# Patient Record
Sex: Male | Born: 1935 | Race: White | Hispanic: No | Marital: Married | Smoking: Former smoker
Health system: Southern US, Community
[De-identification: ages and names within clinical notes are randomized; demographics above are authoritative.]

## PROBLEM LIST (undated history)

## (undated) DIAGNOSIS — C801 Malignant (primary) neoplasm, unspecified: Secondary | ICD-10-CM

## (undated) DIAGNOSIS — C4490 Unspecified malignant neoplasm of skin, unspecified: Secondary | ICD-10-CM

## (undated) DIAGNOSIS — E039 Hypothyroidism, unspecified: Secondary | ICD-10-CM

## (undated) HISTORY — DX: Hypothyroidism, unspecified: E03.9

## (undated) HISTORY — DX: Unspecified malignant neoplasm of skin, unspecified: C44.90

---

## 2001-09-06 ENCOUNTER — Encounter: Admission: RE | Admit: 2001-09-06 | Discharge: 2001-09-06 | Payer: Self-pay | Admitting: Family Medicine

## 2001-09-06 ENCOUNTER — Encounter: Payer: Self-pay | Admitting: Family Medicine

## 2001-09-28 ENCOUNTER — Encounter: Admission: RE | Admit: 2001-09-28 | Discharge: 2001-09-28 | Payer: Self-pay | Admitting: Family Medicine

## 2001-09-28 ENCOUNTER — Encounter: Payer: Self-pay | Admitting: Family Medicine

## 2011-09-28 HISTORY — PX: PROSTATECTOMY: SHX69

## 2012-05-22 DIAGNOSIS — R351 Nocturia: Secondary | ICD-10-CM | POA: Insufficient documentation

## 2012-05-22 DIAGNOSIS — N400 Enlarged prostate without lower urinary tract symptoms: Secondary | ICD-10-CM | POA: Insufficient documentation

## 2012-07-19 DIAGNOSIS — R7989 Other specified abnormal findings of blood chemistry: Secondary | ICD-10-CM | POA: Insufficient documentation

## 2012-10-13 DIAGNOSIS — N35014 Post-traumatic urethral stricture, male, unspecified: Secondary | ICD-10-CM | POA: Insufficient documentation

## 2014-11-08 DIAGNOSIS — H04123 Dry eye syndrome of bilateral lacrimal glands: Secondary | ICD-10-CM | POA: Diagnosis not present

## 2014-11-08 DIAGNOSIS — D3132 Benign neoplasm of left choroid: Secondary | ICD-10-CM | POA: Diagnosis not present

## 2014-11-08 DIAGNOSIS — H2513 Age-related nuclear cataract, bilateral: Secondary | ICD-10-CM | POA: Diagnosis not present

## 2015-03-17 DIAGNOSIS — E780 Pure hypercholesterolemia: Secondary | ICD-10-CM | POA: Diagnosis not present

## 2015-03-17 DIAGNOSIS — Z0000 Encounter for general adult medical examination without abnormal findings: Secondary | ICD-10-CM | POA: Diagnosis not present

## 2015-03-17 DIAGNOSIS — E039 Hypothyroidism, unspecified: Secondary | ICD-10-CM | POA: Diagnosis not present

## 2018-06-16 DIAGNOSIS — R310 Gross hematuria: Secondary | ICD-10-CM | POA: Insufficient documentation

## 2018-06-19 DIAGNOSIS — N3289 Other specified disorders of bladder: Secondary | ICD-10-CM | POA: Insufficient documentation

## 2018-07-18 HISTORY — PX: TRANSURETHRAL RESECTION OF BLADDER TUMOR WITH GYRUS (TURBT-GYRUS): SHX6458

## 2018-07-27 ENCOUNTER — Encounter: Payer: Self-pay | Admitting: Oncology

## 2018-07-27 ENCOUNTER — Telehealth: Payer: Self-pay | Admitting: Oncology

## 2018-07-27 NOTE — Telephone Encounter (Signed)
New referral received from Dr. Tresa Endo at [facility]. I received a call from [place] at Dr. Rosana Hoes' office to schedule an appt. Pt has been scheduled to see Dr. Alen Blew on 11/8 at 2pm. Letter mailed to the pt.

## 2018-08-04 ENCOUNTER — Inpatient Hospital Stay: Payer: Medicare Other

## 2018-08-04 ENCOUNTER — Inpatient Hospital Stay: Payer: Medicare Other | Attending: Oncology | Admitting: Oncology

## 2018-08-04 ENCOUNTER — Telehealth: Payer: Self-pay | Admitting: Oncology

## 2018-08-04 VITALS — BP 133/72 | HR 85 | Temp 98.0°F | Resp 18 | Ht 71.0 in | Wt 188.2 lb

## 2018-08-04 DIAGNOSIS — Z5111 Encounter for antineoplastic chemotherapy: Secondary | ICD-10-CM | POA: Diagnosis present

## 2018-08-04 DIAGNOSIS — Z7189 Other specified counseling: Secondary | ICD-10-CM | POA: Diagnosis not present

## 2018-08-04 DIAGNOSIS — C679 Malignant neoplasm of bladder, unspecified: Secondary | ICD-10-CM

## 2018-08-04 LAB — CBC WITH DIFFERENTIAL (CANCER CENTER ONLY)
Abs Immature Granulocytes: 0.04 10*3/uL (ref 0.00–0.07)
Basophils Absolute: 0.1 10*3/uL (ref 0.0–0.1)
Basophils Relative: 1 %
EOS ABS: 0.2 10*3/uL (ref 0.0–0.5)
EOS PCT: 2 %
HEMATOCRIT: 43.3 % (ref 39.0–52.0)
Hemoglobin: 14 g/dL (ref 13.0–17.0)
Immature Granulocytes: 0 %
LYMPHS ABS: 2.4 10*3/uL (ref 0.7–4.0)
Lymphocytes Relative: 26 %
MCH: 30.6 pg (ref 26.0–34.0)
MCHC: 32.3 g/dL (ref 30.0–36.0)
MCV: 94.5 fL (ref 80.0–100.0)
MONOS PCT: 7 %
Monocytes Absolute: 0.6 10*3/uL (ref 0.1–1.0)
Neutro Abs: 6 10*3/uL (ref 1.7–7.7)
Neutrophils Relative %: 64 %
Platelet Count: 258 10*3/uL (ref 150–400)
RBC: 4.58 MIL/uL (ref 4.22–5.81)
RDW: 12.4 % (ref 11.5–15.5)
WBC Count: 9.3 10*3/uL (ref 4.0–10.5)
nRBC: 0 % (ref 0.0–0.2)

## 2018-08-04 LAB — CMP (CANCER CENTER ONLY)
ALBUMIN: 4.1 g/dL (ref 3.5–5.0)
ALT: 10 U/L (ref 0–44)
AST: 12 U/L — ABNORMAL LOW (ref 15–41)
Alkaline Phosphatase: 61 U/L (ref 38–126)
Anion gap: 9 (ref 5–15)
BUN: 19 mg/dL (ref 8–23)
CALCIUM: 9.9 mg/dL (ref 8.9–10.3)
CO2: 30 mmol/L (ref 22–32)
CREATININE: 1.61 mg/dL — AB (ref 0.61–1.24)
Chloride: 106 mmol/L (ref 98–111)
GFR, EST AFRICAN AMERICAN: 45 mL/min — AB (ref >60)
GFR, Estimated: 39 mL/min — ABNORMAL LOW (ref >60)
GLUCOSE: 96 mg/dL (ref 70–99)
Potassium: 5.3 mmol/L — ABNORMAL HIGH (ref 3.5–5.1)
SODIUM: 145 mmol/L (ref 135–145)
Total Bilirubin: 0.4 mg/dL (ref 0.3–1.2)
Total Protein: 7.4 g/dL (ref 6.5–8.1)

## 2018-08-04 NOTE — Telephone Encounter (Signed)
Printed calendar and avs. °

## 2018-08-04 NOTE — Progress Notes (Signed)
START OFF PATHWAY REGIMEN - Bladder   [id number]:Carboplatin AUC=2:   Administer weekly:     Carboplatin   **Always confirm dose/schedule in your pharmacy ordering system**  Patient Characteristics: Post Cystectomy, T2, N0, M0 AJCC M Category: M0 AJCC N Category: N0 AJCC T Category: pT2b Current evidence of distant metastases<= No AJCC 8 Stage Grouping: II Intent of Therapy: Curative Intent, Discussed with Patient

## 2018-08-04 NOTE — Progress Notes (Signed)
Reason for the request:    Bladder cancer  HPI: I was asked by Dr. Rosana Hoes to evaluate [name] for new diagnosis of bladder cancer.  This is an 82 year old man currently of [place].  He has history of BPH status post prostatectomy in 2013.  He has been evaluated by Dr. Rosana Hoes for gross hematuria in September 2019.  At that time he had a cystoscopy which showed a large mass in the right bladder wall occupying a big portion of the right anterior bladder wall.  Based on these findings he underwent a TURBT on July 18, 2018.  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  He was found to have T2 lesion with grade 3 histology.  He tolerated the procedure well without any complications.  He is no longer reporting any hematuria or dysuria.  He remains active and continues to be in excellent health and shape.  He was referred to me by Dr. Rosana Hoes for further evaluation regarding his recent diagnosis.  He does not report any headaches, blurry vision, syncope or seizures. Does not report any fevers, chills or sweats.  Does not report any cough, wheezing or hemoptysis.  Does not report any chest pain, palpitation, orthopnea or leg edema.  Does not report any nausea, vomiting or abdominal pain.  Does not report any constipation or diarrhea.  Does not report any skeletal complaints.    Does not report frequency, urgency or hematuria.  Does not report any skin rashes or lesions. Does not report any heat or cold intolerance.  Does not report any lymphadenopathy or petechiae.  Does not report any anxiety or depression.  Remaining review of systems is negative.   Past medical history: He has history of hypothyroidism and BPH.   No known drug allergies.  No family history   Social History: He denies any alcohol or tobacco abuse.   Socioeconomic History  . Marital status: Married    Spouse name: Not on file  . Number of children:  Not on file  . Years of education: Not on file  . Highest education level: Not on file  Occupational History  . Not on file  Social Needs  . Financial resource strain: Not on file  . Food insecurity:    Worry: Not on file    Inability: Not on file  . Transportation needs:    Medical: Not on file    Non-medical: Not on file  Tobacco Use  . Smoking status: Not on file  Substance and Sexual Activity  . Alcohol use: Not on file  . Drug use: Not on file  . Sexual activity: Not on file  Lifestyle  . Physical activity:    Days per week: Not on file    Minutes per session: Not on file  . Stress: Not on file  Relationships  . Social connections:    Talks on phone: Not on file    Gets together: Not on file    Attends religious service: Not on file    Active member of club or organization: Not on file    Attends meetings of clubs or organizations: Not on file    Relationship status: Not on file  . Intimate partner violence:    Fear of current or ex partner: Not on file    Emotionally abused: Not on file    Physically abused: Not on file    Forced sexual activity: Not on file  Other  Topics Concern  . Not on file  Social History Narrative  . Not on file  :  Pertinent items are noted in HPI.  Exam: Blood pressure 133/72, pulse 85, temperature 98 F (36.7 C), temperature source Oral, resp. rate 18, height 5\' 11"  (1.803 m), weight 188 lb 3.2 oz (85.4 kg), SpO2 99 %.   ECOG 1  General appearance: alert and cooperative appeared without distress. Head: atraumatic without any abnormalities. Eyes: conjunctivae/corneas clear. PERRL.  Sclera anicteric. Throat: lips, mucosa, and tongue normal; without oral thrush or ulcers. Resp: clear to auscultation bilaterally without rhonchi, wheezes or dullness to percussion. Cardio: regular rate and rhythm, S1, S2 normal, no murmur, click, rub or gallop GI: soft, non-tender; bowel sounds normal; no masses,  no organomegaly Skin: Skin color,  texture, turgor normal. No rashes or lesions Lymph nodes: Cervical, supraclavicular, and axillary nodes normal. Neurologic: Grossly normal without any motor, sensory or deep tendon reflexes. Musculoskeletal: No joint deformity or effusion.    Assessment and Plan:   82 year old man with the following:  1.  Bladder cancer diagnosed in October 2019.  He was found to have high-grade urothelial carcinoma with apparent muscle invasion indicating T2 disease.  His pathology does report high-grade tumor.  There is no evidence of extravesical extension or disease beyond the bladder although no imaging studies has been completed at this time.  His disease status was discussed today in detail as well as the natural course of muscle invasive bladder tumor today.  The first step is to complete his staging work-up with imaging studies of the chest abdomen and pelvis to complete the staging.  If we are dealing with a T2 a N0 disease treatment options were discussed today in detail.  These options would include radical cystectomy after neoadjuvant chemotherapy which constitutes really the standard of care.  He is in reasonable health and shape and his baseline kidney function appears adequate to withstand platinum based chemotherapy.  After discussion today, he is not interested in having radical cystectomy and would prefer an alternative option.  The role for definitive therapy with radiation concomitantly with chemotherapy as a bladder sparing approach was discussed today.  The role for platinum based concomitant chemotherapy was discussed today.  Complication associated with weekly carboplatin at this time was discussed.  These complications include nausea, fatigue, myelosuppression and renal insufficiency.  This will be administered weekly concomitantly with daily radiation for 6 weeks.  Complications associated with radiation were discussed briefly today which include fatigue, diarrhea and dysuria.  After  discussion today and discussing the risks and benefits of all these approaches he is interested in receiving radiation therapy as a definitive modality.  I will make the appropriate referrals to radiation oncology after completing his staging work-up in the immediate future.  He will also attend chemotherapy education class in preparation for his weekly carboplatin therapy.  2.  IV access: Peripheral veins will be used at this time.  Port-A-Cath can be used in the future IV access becomes an issue.  3.  Antiemetics: Prescription for Compazine will be made available to him.  4.  Kidney function surveillance: His creatinine clearance is around 55 cc/min I will continue to monitor periodically.  5.  Goals of care and prognosis: His disease appears to be curable at this time.  Staging CT scan can change that however.  Aggressive therapy is warranted.  6.  Follow-up: We will be in the next few weeks to start therapy after completing his staging work-up.  60  minutes was spent with the patient face-to-face today.  More than 50% of time was dedicated to reviewing his disease status, treatment options and complications related to therapy.   Thank you for the referral.  A copy of this consult has been forwarded to the requesting physician.

## 2018-08-07 ENCOUNTER — Ambulatory Visit (HOSPITAL_COMMUNITY)
Admission: RE | Admit: 2018-08-07 | Discharge: 2018-08-07 | Disposition: A | Discharge status: Home / Self Care | Payer: Medicare Other | Source: Ambulatory Visit | Attending: Oncology | Admitting: Oncology

## 2018-08-07 ENCOUNTER — Other Ambulatory Visit: Payer: Self-pay | Admitting: Oncology

## 2018-08-07 ENCOUNTER — Inpatient Hospital Stay: Payer: Medicare Other

## 2018-08-07 ENCOUNTER — Encounter (HOSPITAL_COMMUNITY): Payer: Self-pay

## 2018-08-07 DIAGNOSIS — I700 Atherosclerosis of aorta: Secondary | ICD-10-CM | POA: Diagnosis not present

## 2018-08-07 DIAGNOSIS — K4020 Bilateral inguinal hernia, without obstruction or gangrene, not specified as recurrent: Secondary | ICD-10-CM | POA: Insufficient documentation

## 2018-08-07 DIAGNOSIS — C679 Malignant neoplasm of bladder, unspecified: Secondary | ICD-10-CM | POA: Insufficient documentation

## 2018-08-07 HISTORY — DX: Malignant (primary) neoplasm, unspecified: C80.1

## 2018-08-07 MED ORDER — SODIUM CHLORIDE (PF) 0.9 % IJ SOLN
INTRAMUSCULAR | Status: AC
  Filled 2018-08-07: qty 50

## 2018-08-07 MED ORDER — SODIUM CHLORIDE 0.9 % IV SOLN
INTRAVENOUS | Status: AC
  Filled 2018-08-07: qty 250

## 2018-08-07 MED ORDER — IOHEXOL 300 MG/ML  SOLN
80.0000 mL | Freq: Once | INTRAMUSCULAR | Status: AC | PRN
  Administered 2018-08-07: 80 mL via INTRAVENOUS

## 2018-08-07 MED ORDER — PROCHLORPERAZINE MALEATE 10 MG PO TABS: 10.0000 mg | ORAL_TABLET | Freq: Four times a day (QID) | ORAL | 0 refills | Status: DC | PRN

## 2018-08-08 ENCOUNTER — Other Ambulatory Visit: Payer: Medicare Other

## 2018-08-14 ENCOUNTER — Inpatient Hospital Stay: Payer: Medicare Other

## 2018-08-14 ENCOUNTER — Encounter: Payer: Self-pay | Admitting: Oncology

## 2018-08-14 VITALS — BP 137/84 | HR 93 | Temp 98.3°F | Resp 18

## 2018-08-14 DIAGNOSIS — C679 Malignant neoplasm of bladder, unspecified: Secondary | ICD-10-CM

## 2018-08-14 DIAGNOSIS — Z5111 Encounter for antineoplastic chemotherapy: Secondary | ICD-10-CM | POA: Diagnosis not present

## 2018-08-14 LAB — CBC WITH DIFFERENTIAL (CANCER CENTER ONLY)
Abs Immature Granulocytes: 0.06 10*3/uL (ref 0.00–0.07)
Basophils Absolute: 0.1 10*3/uL (ref 0.0–0.1)
Basophils Relative: 1 %
EOS ABS: 0.1 10*3/uL (ref 0.0–0.5)
EOS PCT: 1 %
HCT: 44 % (ref 39.0–52.0)
HEMOGLOBIN: 14.1 g/dL (ref 13.0–17.0)
Immature Granulocytes: 1 %
LYMPHS ABS: 1.5 10*3/uL (ref 0.7–4.0)
LYMPHS PCT: 15 %
MCH: 29.9 pg (ref 26.0–34.0)
MCHC: 32 g/dL (ref 30.0–36.0)
MCV: 93.2 fL (ref 80.0–100.0)
MONO ABS: 0.8 10*3/uL (ref 0.1–1.0)
MONOS PCT: 8 %
Neutro Abs: 7.5 10*3/uL (ref 1.7–7.7)
Neutrophils Relative %: 74 %
Platelet Count: 231 10*3/uL (ref 150–400)
RBC: 4.72 MIL/uL (ref 4.22–5.81)
RDW: 11.9 % (ref 11.5–15.5)
WBC Count: 10.2 10*3/uL (ref 4.0–10.5)
nRBC: 0 % (ref 0.0–0.2)

## 2018-08-14 LAB — CMP (CANCER CENTER ONLY)
ALBUMIN: 3.5 g/dL (ref 3.5–5.0)
ALK PHOS: 68 U/L (ref 38–126)
ALT: 8 U/L (ref 0–44)
AST: 11 U/L — AB (ref 15–41)
Anion gap: 10 (ref 5–15)
BUN: 15 mg/dL (ref 8–23)
CALCIUM: 9.4 mg/dL (ref 8.9–10.3)
CHLORIDE: 108 mmol/L (ref 98–111)
CO2: 24 mmol/L (ref 22–32)
CREATININE: 1.31 mg/dL — AB (ref 0.61–1.24)
GFR, EST AFRICAN AMERICAN: 58 mL/min — AB (ref >60)
GFR, Estimated: 50 mL/min — ABNORMAL LOW (ref >60)
GLUCOSE: 116 mg/dL — AB (ref 70–99)
Potassium: 4 mmol/L (ref 3.5–5.1)
SODIUM: 142 mmol/L (ref 135–145)
Total Bilirubin: 0.6 mg/dL (ref 0.3–1.2)
Total Protein: 7.2 g/dL (ref 6.5–8.1)

## 2018-08-14 MED ORDER — DEXAMETHASONE SODIUM PHOSPHATE 10 MG/ML IJ SOLN
INTRAMUSCULAR | Status: AC
  Filled 2018-08-14: qty 1

## 2018-08-14 MED ORDER — SODIUM CHLORIDE 0.9 % IV SOLN
160.0000 mg | Freq: Once | INTRAVENOUS | Status: AC
  Administered 2018-08-14: 160 mg via INTRAVENOUS
  Filled 2018-08-14: qty 16

## 2018-08-14 MED ORDER — SODIUM CHLORIDE 0.9 % IV SOLN
Freq: Once | INTRAVENOUS | Status: AC
  Administered 2018-08-14: 10:00:00 via INTRAVENOUS
  Filled 2018-08-14: qty 250

## 2018-08-14 MED ORDER — PALONOSETRON HCL INJECTION 0.25 MG/5ML
0.2500 mg | Freq: Once | INTRAVENOUS | Status: AC
  Administered 2018-08-14: 0.25 mg via INTRAVENOUS

## 2018-08-14 MED ORDER — DEXAMETHASONE SODIUM PHOSPHATE 10 MG/ML IJ SOLN
10.0000 mg | Freq: Once | INTRAMUSCULAR | Status: AC
  Administered 2018-08-14: 10 mg via INTRAVENOUS

## 2018-08-14 MED ORDER — PALONOSETRON HCL INJECTION 0.25 MG/5ML
INTRAVENOUS | Status: AC
  Filled 2018-08-14: qty 5

## 2018-08-14 NOTE — Patient Instructions (Signed)
[facility] Discharge Instructions for Patients Receiving Chemotherapy  Today you received the following chemotherapy agents Carboplatin To help prevent nausea and vomiting after your treatment, we encourage you to take your nausea medication as directed   If you develop nausea and vomiting that is not controlled by your nausea medication, call the clinic.   BELOW ARE SYMPTOMS THAT SHOULD BE REPORTED IMMEDIATELY:  *FEVER GREATER THAN 100.5 F  *CHILLS WITH OR WITHOUT FEVER  NAUSEA AND VOMITING THAT IS NOT CONTROLLED WITH YOUR NAUSEA MEDICATION  *UNUSUAL SHORTNESS OF BREATH  *UNUSUAL BRUISING OR BLEEDING  TENDERNESS IN MOUTH AND THROAT WITH OR WITHOUT PRESENCE OF ULCERS  *URINARY PROBLEMS  *BOWEL PROBLEMS  UNUSUAL RASH Items with * indicate a potential emergency and should be followed up as soon as possible.  Feel free to call the clinic should you have any questions or concerns. The clinic phone number is [phone].  Please show the CHEMO ALERT CARD at check-in to the Emergency Department and triage nurse.      Carboplatin injection What is this medicine? CARBOPLATIN (KAR boe pla tin) is a chemotherapy drug. It targets fast dividing cells, like cancer cells, and causes these cells to die. This medicine is used to treat ovarian cancer and many other cancers. This medicine may be used for other purposes; ask your health care provider or pharmacist if you have questions. COMMON BRAND NAME(S): Paraplatin What should I tell my health care provider before I take this medicine? They need to know if you have any of these conditions: -blood disorders -hearing problems -kidney disease -recent or ongoing radiation therapy -an unusual or allergic reaction to carboplatin, cisplatin, other chemotherapy, other medicines, foods, dyes, or preservatives -pregnant or trying to get pregnant -breast-feeding How should I use this medicine? This drug is usually given as  an infusion into a vein. It is administered in a hospital or clinic by a specially trained health care professional. Talk to your pediatrician regarding the use of this medicine in children. Special care may be needed. Overdosage: If you think you have taken too much of this medicine contact a poison control center or emergency room at once. NOTE: This medicine is only for you. Do not share this medicine with others. What if I miss a dose? It is important not to miss a dose. Call your doctor or health care professional if you are unable to keep an appointment. What may interact with this medicine? -medicines for seizures -medicines to increase blood counts like filgrastim, pegfilgrastim, sargramostim -some antibiotics like amikacin, gentamicin, neomycin, streptomycin, tobramycin -vaccines Talk to your doctor or health care professional before taking any of these medicines: -acetaminophen -aspirin -ibuprofen -ketoprofen -naproxen This list may not describe all possible interactions. Give your health care provider a list of all the medicines, herbs, non-prescription drugs, or dietary supplements you use. Also tell them if you smoke, drink alcohol, or use illegal drugs. Some items may interact with your medicine. What should I watch for while using this medicine? Your condition will be monitored carefully while you are receiving this medicine. You will need important blood work done while you are taking this medicine. This drug may make you feel generally unwell. This is not uncommon, as chemotherapy can affect healthy cells as well as cancer cells. Report any side effects. Continue your course of treatment even though you feel ill unless your doctor tells you to stop. In some cases, you may be given additional medicines to help with side   effects. Follow all directions for their use. Call your doctor or health care professional for advice if you get a fever, chills or sore throat, or other  symptoms of a cold or flu. Do not treat yourself. This drug decreases your body's ability to fight infections. Try to avoid being around people who are sick. This medicine may increase your risk to bruise or bleed. Call your doctor or health care professional if you notice any unusual bleeding. Be careful brushing and flossing your teeth or using a toothpick because you may get an infection or bleed more easily. If you have any dental work done, tell your dentist you are receiving this medicine. Avoid taking products that contain aspirin, acetaminophen, ibuprofen, naproxen, or ketoprofen unless instructed by your doctor. These medicines may hide a fever. Do not become pregnant while taking this medicine. Women should inform their doctor if they wish to become pregnant or think they might be pregnant. There is a potential for serious side effects to an unborn child. Talk to your health care professional or pharmacist for more information. Do not breast-feed an infant while taking this medicine. What side effects may I notice from receiving this medicine? Side effects that you should report to your doctor or health care professional as soon as possible: -allergic reactions like skin rash, itching or hives, swelling of the face, lips, or tongue -signs of infection - fever or chills, cough, sore throat, pain or difficulty passing urine -signs of decreased platelets or bleeding - bruising, pinpoint red spots on the skin, black, tarry stools, nosebleeds -signs of decreased red blood cells - unusually weak or tired, fainting spells, lightheadedness -breathing problems -changes in hearing -changes in vision -chest pain -high blood pressure -low blood counts - This drug may decrease the number of white blood cells, red blood cells and platelets. You may be at increased risk for infections and bleeding. -nausea and vomiting -pain, swelling, redness or irritation at the injection site -pain, tingling,  numbness in the hands or feet -problems with balance, talking, walking -trouble passing urine or change in the amount of urine Side effects that usually do not require medical attention (report to your doctor or health care professional if they continue or are bothersome): -hair loss -loss of appetite -metallic taste in the mouth or changes in taste This list may not describe all possible side effects. Call your doctor for medical advice about side effects. You may report side effects to FDA at 1-800-FDA-1088. Where should I keep my medicine? This drug is given in a hospital or clinic and will not be stored at home. NOTE: This sheet is a summary. It may not cover all possible information. If you have questions about this medicine, talk to your doctor, pharmacist, or health care provider.  2018 Elsevier/Gold Standard (2007-12-19 14:38:05)     

## 2018-08-14 NOTE — Progress Notes (Signed)
Pt returned my call so I introduced myself as his Arboriculturist and discussed copay assistance.  Pt gave me consent to apply in his behalf and he was approved w/ the Patient Access [employer] for $3,400 from8/20/19 to11/17/20 for Carboplatin.  Pt is overqualifiedfor the [employer]. I will give him my card on 08/16/18 for any questions or concerns he may have in the future.

## 2018-08-14 NOTE — Progress Notes (Signed)
Called pt to introduce myself as his Arboriculturist and to discuss copay assistance and the [employer].  Left a msg requesting he return my call at his earliest convenience.

## 2018-08-15 NOTE — Progress Notes (Signed)
GU Location of Tumor / Histology: urothelial carcinoma that is high-grade with invasion suspicious into the muscularis propria.  He was found to have T2 lesion with grade 3 histology  Anthony Pacheco presented with: gross hematuria in September 2019  Past/Anticipated interventions by urology, if any: 07/18/18 Dr. Rosana Hoes: Endoscopic Surgery  Cystoscopy, Transurethral Resection of Bladder Tumor   Past/Anticipated interventions by medical oncology, if any: Per Dr. Alen Blew 08/04/18:  1.  Bladder cancer diagnosed in October 2019.  He was found to have high-grade urothelial carcinoma with apparent muscle invasion indicating T2 disease.  His pathology does report high-grade tumor.  There is no evidence of extravesical extension or disease beyond the bladder although no imaging studies has been completed at this time.  His disease status was discussed today in detail as well as the natural course of muscle invasive bladder tumor today.  The first step is to complete his staging work-up with imaging studies of the chest abdomen and pelvis to complete the staging.  If we are dealing with a T2 a N0 disease treatment options were discussed today in detail.  These options would include radical cystectomy after neoadjuvant chemotherapy which constitutes really the standard of care.  He is in reasonable health and shape and his baseline kidney function appears adequate to withstand platinum based chemotherapy.  After discussion today, he is not interested in having radical cystectomy and would prefer an alternative option.  The role for definitive therapy with radiation concomitantly with chemotherapy as a bladder sparing approach was discussed today.  The role for platinum based concomitant chemotherapy was discussed today.  Complication associated with weekly carboplatin at this time was discussed.  These complications include nausea, fatigue, myelosuppression and renal insufficiency.  This will be administered  weekly concomitantly with daily radiation for 6 weeks.  Complications associated with radiation were discussed briefly today which include fatigue, diarrhea and dysuria.  After discussion today and discussing the risks and benefits of all these approaches he is interested in receiving radiation therapy as a definitive modality.  I will make the appropriate referrals to radiation oncology after completing his staging work-up in the immediate future.  He will also attend chemotherapy education class in preparation for his weekly carboplatin therapy.  2.  IV access: Peripheral veins will be used at this time.  Port-A-Cath can be used in the future IV access becomes an issue.  3.  Antiemetics: Prescription for Compazine will be made available to him.  4.  Kidney function surveillance: His creatinine clearance is around 55 cc/min I will continue to monitor periodically.  5.  Goals of care and prognosis: His disease appears to be curable at this time.  Staging CT scan can change that however.  Aggressive therapy is warranted.  6.  Follow-up: We will be in the next few weeks to start therapy after completing his staging work-up.  Weight changes, if any: None  Bowel/Bladder complaints, if any: Does not report any constipation or diarrhea. Does not report any skeletal complaints.   Does not report frequency, urgency or hematuria.   Nausea/Vomiting, if any: Does not report any nausea, vomiting or abdominal pain.  Pain issues, if any:  Pt denies c/o pain  SAFETY ISSUES:  Prior radiation? No  Pacemaker/ICD? No  Possible current pregnancy? N/A, pt is male  Is the patient on methotrexate? No  Current Complaints / other details:  Pt presents today for initial consult with Dr. Sondra Come for Radiation Therapy. Pt is accompanied by wife.  BP (!) 144/65 (BP Location: Right Arm, Patient Position: Sitting)   Pulse 89   Temp 97.9 F (36.6 C) (Oral)   Resp 18   Ht 5\' 10"  (1.778 m)   Wt 189 lb  6 oz (85.9 kg)   SpO2 99%   BMI 27.17 kg/m   Wt Readings from Last 3 Encounters:  08/16/18 189 lb 6 oz (85.9 kg)  08/04/18 188 lb 3.2 oz (85.4 kg)   [name], RN BSN

## 2018-08-16 ENCOUNTER — Telehealth: Payer: Self-pay | Admitting: *Deleted

## 2018-08-16 ENCOUNTER — Ambulatory Visit
Admission: RE | Admit: 2018-08-16 | Discharge: 2018-08-16 | Disposition: A | Discharge status: Home / Self Care | Payer: Medicare Other | Source: Ambulatory Visit | Attending: Radiation Oncology | Admitting: Radiation Oncology

## 2018-08-16 ENCOUNTER — Other Ambulatory Visit: Payer: Self-pay

## 2018-08-16 ENCOUNTER — Encounter: Payer: Self-pay | Admitting: Radiation Oncology

## 2018-08-16 VITALS — BP 144/65 | HR 89 | Temp 97.9°F | Resp 18 | Ht 70.0 in | Wt 189.4 lb

## 2018-08-16 DIAGNOSIS — C679 Malignant neoplasm of bladder, unspecified: Secondary | ICD-10-CM

## 2018-08-16 DIAGNOSIS — C673 Malignant neoplasm of anterior wall of bladder: Secondary | ICD-10-CM | POA: Diagnosis present

## 2018-08-16 NOTE — Telephone Encounter (Signed)
-----   Message from Veverly Fells, RN sent at 08/14/2018  1:00 PM EST ----- Regarding: SHADAD: First time follow up Pt received Carboplatin for the first time today. He tolerated his treatment well with no complaints.

## 2018-08-16 NOTE — Telephone Encounter (Signed)
Lm for patient to call me back re: 1st time chemo f/u call.

## 2018-08-16 NOTE — Progress Notes (Signed)
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 He was found to have T2 lesion with grade 3 histology  AJCC 8 Stage Grouping: II  HISTORY OF PRESENT ILLNESS::Anthony Pacheco is a 82 y.o. male who is presenting to the office today for evaluation of recently diagnosed muscle-invasive bladder cancer. He presented to his primary care physician Dr. [name] with hematuria who sent him to the urologist Dr. Rosana Hoes who diagnosed him with bladder cancer. He is accompanied by his wife.   On 07/18/18 he had a TURBT procedure after his above visits that showed multiple irregular tissue fragments. Intraoperatively the patient was noted to have a sessile tumor involving the anterior lateral wall of the urinary bladder.  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  He underwent a CT of his chest, abdomen and pelvis with contrast on 08/07/18 that showed a 1.7 x 3.6 cm soft tissue mass along the right anterior bladder wall - question neoplasm and/or surgical changes - with no evidence of synchronous uroepithelial malignancy, lymphadenopathy or metastatic disease.   he reports associated hematuria onset approximately 3 weeks ago. he denies headaches, vision problems, swelling and any other symptoms. No Problems with hematuria after his surgery   PREVIOUS RADIATION THERAPY: No  PAST MEDICAL HISTORY:  has a past medical history of bladder ca (dx'd 06/2018).    PAST SURGICAL HISTORY: Past Surgical History:    Procedure Laterality Date  . PROSTATECTOMY  2013  . TRANSURETHRAL RESECTION OF BLADDER TUMOR WITH GYRUS (TURBT-GYRUS)  07/18/2018    FAMILY HISTORY: family history is not on file.  SOCIAL HISTORY:    ALLERGIES: Patient has no known allergies.  MEDICATIONS:  Current Outpatient Medications  Medication Sig Dispense Refill  . levothyroxine (SYNTHROID, LEVOTHROID) 100 MCG tablet Take 100 mcg by mouth daily.  3  . prochlorperazine (COMPAZINE) 10 MG tablet Take 1 tablet (10 mg total) by mouth every 6 (six) hours as needed for nausea or vomiting. 30 tablet 0   No current facility-administered medications for this encounter.     REVIEW OF SYSTEMS:  A 10+ POINT REVIEW OF SYSTEMS WAS OBTAINED including neurology, dermatology, psychiatry, cardiac, respiratory, lymph, extremities, GI, GU, musculoskeletal, constitutional, reproductive, HEENT. All pertinent positives are noted in the HPI. All others are negative.    PHYSICAL EXAM:  height is 5\' 10"  (1.778 m) and weight is 189 lb 6 oz (85.9 kg). His oral temperature is 97.9 F (36.6 C). His blood pressure is 144/65 (abnormal) and his pulse is 89. His respiration is 18 and oxygen saturation is 99%.   General: Alert and oriented, in no acute distress HEENT: Head is normocephalic. Extraocular movements are intact. Oropharynx is clear. Neck: Neck is supple, no palpable cervical or supraclavicular lymphadenopathy. Heart: Regular in rate and rhythm with no murmurs, rubs, or gallops. Chest: Clear to auscultation bilaterally, with no rhonchi, wheezes, or rales. Abdomen: Soft, nontender, nondistended, with no rigidity or guarding. Extremities: No cyanosis or edema. Lymphatics: see Neck Exam Skin: No concerning lesions. He has small scars on his abdomen from laparoscopic prostate surgery.  Musculoskeletal: symmetric strength and muscle tone throughout. Neurologic: Cranial nerves II through XII are grossly intact. No obvious focalities. Speech is fluent.  Coordination is intact. Psychiatric: Judgment and insight are intact. Affect is appropriate.   ECOG = 1  0 - Asymptomatic (Fully active, able to carry on all predisease activities without restriction)  1 - Symptomatic but completely ambulatory (Restricted in physically strenuous activity but ambulatory and able to carry out work of a light or sedentary nature. For example, light housework, office work)  2 - Symptomatic, <50% in bed during the day (Ambulatory and capable of all self care but unable to carry out any work activities. Up and about more than 50% of waking hours)  3 - Symptomatic, >50% in bed, but not bedbound (Capable of only limited self-care, confined to bed or chair 50% or more of waking hours)  4 - Bedbound (Completely disabled. Cannot carry on any self-care. Totally confined to bed or chair)  5 - Death   Eustace Pen MM, Creech RH, Tormey DC, et al. [phone]). "Toxicity and response criteria of the [facility]". [place] Oncol. 5 (6): 649-55  LABORATORY DATA:  Lab Results  Component Value Date   WBC 10.2 08/14/2018   HGB 14.1 08/14/2018   HCT 44.0 08/14/2018   MCV 93.2 08/14/2018   PLT 231 08/14/2018   NEUTROABS 7.5 08/14/2018   Lab Results  Component Value Date   NA 142 08/14/2018   K 4.0 08/14/2018   CL 108 08/14/2018   CO2 24 08/14/2018   GLUCOSE 116 (H) 08/14/2018   CREATININE 1.31 (H) 08/14/2018   CALCIUM 9.4 08/14/2018      RADIOGRAPHY: Ct Chest W Contrast  Result Date: 08/07/2018 CLINICAL DATA:  82 year old male for staging of bladder cancer. Status post TURBT. EXAM: CT CHEST, ABDOMEN, AND PELVIS WITH CONTRAST TECHNIQUE: Multidetector CT imaging of the chest, abdomen and pelvis was performed following the standard protocol during bolus administration of intravenous contrast. CONTRAST:  62mL OMNIPAQUE IOHEXOL 300 MG/ML  SOLN COMPARISON:  None. FINDINGS: CT CHEST FINDINGS Cardiovascular: Normal heart size. Mild aortic atherosclerotic  calcifications noted without aneurysm. No pericardial effusion. Mediastinum/Nodes: No enlarged mediastinal, hilar, or axillary lymph nodes. Thyroid gland, trachea, and esophagus demonstrate no significant findings. Lungs/Pleura: Biapical pleuroparenchymal scarring and RIGHT pleural thickening/plaques noted. No airspace disease, consolidation, suspicious nodule, mass, pleural effusion or pneumothorax. Mild dependent opacities are noted which may represent atelectasis. Musculoskeletal: No chest wall mass or suspicious bone lesions identified. CT ABDOMEN PELVIS FINDINGS Hepatobiliary: The liver is unremarkable. Cholelithiasis identified without CT evidence of acute cholecystitis. No biliary dilatation. Pancreas: Unremarkable Spleen: Unremarkable Adrenals/Urinary Tract: Focal thickening along the RIGHT anterior bladder wall is noted measuring 1.7 x 3.6 cm. No filling defects are identified in the renal collecting systems or ureters. The kidneys and adrenal glands are unremarkable. Stomach/Bowel: Stomach is within normal limits. Appendix appears normal. No evidence of bowel wall thickening, distention, or inflammatory changes. Colonic diverticulosis noted without evidence of diverticulitis. Vascular/Lymphatic: Aortic atherosclerosis. No enlarged abdominal or pelvic lymph nodes. Reproductive: Prostate surgical changes noted. Other: No ascites, pneumoperitoneum or focal collection. Small bilateral inguinal hernias containing fat are present. Musculoskeletal: No acute or suspicious bony abnormalities. Degenerative changes within the lumbar spine identified. IMPRESSION: 1. 1.7 x 3.6 cm soft tissue along the RIGHT anterior bladder wall-question neoplasm and/or surgical changes. No evidence of synchronous uroepithelial malignancy, lymphadenopathy or metastatic disease. 2. Small bilateral inguinal hernias containing fat 3.  Aortic Atherosclerosis (ICD10-I70.0). Electronically Signed  By: Margarette Canada M.D.   On: 08/07/2018 09:39    Ct Abdomen Pelvis W Contrast  Result Date: 08/07/2018 CLINICAL DATA:  82 year old male for staging of bladder cancer. Status post TURBT. EXAM: CT CHEST, ABDOMEN, AND PELVIS WITH CONTRAST TECHNIQUE: Multidetector CT imaging of the chest, abdomen and pelvis was performed following the standard protocol during bolus administration of intravenous contrast. CONTRAST:  44mL OMNIPAQUE IOHEXOL 300 MG/ML  SOLN COMPARISON:  None. FINDINGS: CT CHEST FINDINGS Cardiovascular: Normal heart size. Mild aortic atherosclerotic calcifications noted without aneurysm. No pericardial effusion. Mediastinum/Nodes: No enlarged mediastinal, hilar, or axillary lymph nodes. Thyroid gland, trachea, and esophagus demonstrate no significant findings. Lungs/Pleura: Biapical pleuroparenchymal scarring and RIGHT pleural thickening/plaques noted. No airspace disease, consolidation, suspicious nodule, mass, pleural effusion or pneumothorax. Mild dependent opacities are noted which may represent atelectasis. Musculoskeletal: No chest wall mass or suspicious bone lesions identified. CT ABDOMEN PELVIS FINDINGS Hepatobiliary: The liver is unremarkable. Cholelithiasis identified without CT evidence of acute cholecystitis. No biliary dilatation. Pancreas: Unremarkable Spleen: Unremarkable Adrenals/Urinary Tract: Focal thickening along the RIGHT anterior bladder wall is noted measuring 1.7 x 3.6 cm. No filling defects are identified in the renal collecting systems or ureters. The kidneys and adrenal glands are unremarkable. Stomach/Bowel: Stomach is within normal limits. Appendix appears normal. No evidence of bowel wall thickening, distention, or inflammatory changes. Colonic diverticulosis noted without evidence of diverticulitis. Vascular/Lymphatic: Aortic atherosclerosis. No enlarged abdominal or pelvic lymph nodes. Reproductive: Prostate surgical changes noted. Other: No ascites, pneumoperitoneum or focal collection. Small bilateral inguinal  hernias containing fat are present. Musculoskeletal: No acute or suspicious bony abnormalities. Degenerative changes within the lumbar spine identified. IMPRESSION: 1. 1.7 x 3.6 cm soft tissue along the RIGHT anterior bladder wall-question neoplasm and/or surgical changes. No evidence of synchronous uroepithelial malignancy, lymphadenopathy or metastatic disease. 2. Small bilateral inguinal hernias containing fat 3.  Aortic Atherosclerosis (ICD10-I70.0). Electronically Signed   By: Margarette Canada M.D.   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 He was found to have T2 lesion with grade 3 histology. AJCC 8 Stage Grouping: II. Options for management were discussed with the patient by Dr. Lawerance Bach, Dr. Alen Blew and myself. Patient understands that his best chances for cure would be neoadjuvant chemotherapy followed by radical cystectomy. Patient does not wish to have his bladder permanently removed and would like to proceed with a definitive course of radiosensitizing chemotherapy along with radiation therapy directed at the pelvis/bladder area.  Today, I talked to the patient and his wife about the findings and work-up thus far.  We discussed the natural history of bladder cancer and general treatment, highlighting the role of radiotherapy in the management.  We discussed the available radiation techniques, and focused on the details of logistics and delivery.  We reviewed the anticipated acute and late sequelae associated with radiation in this setting.  The patient was encouraged to ask questions that I answered to the best of my ability.  A patient consent form was discussed and signed.  We retained a copy for our records.  The patient would like to proceed with radiation and will be scheduled for CT simulation.   PLAN: Patient is scheduled for CT simulation on November 25 with treatments to begin the first week in  December along with  weekly carboplatin based radiosensitizing chemotherapy. Anticipate 6 weeks of radiation therapy.    ------------------------------------------------  Blair Promise, PhD, MD  This document serves as a record of services personally performed by Gery Pray, MD. It was created on his behalf by Mary-Margaret Loma Messing, a trained medical scribe. The creation of this record is based on the scribe's personal observations and the provider's statements to them. This document has been checked and approved by the attending provider.

## 2018-08-21 ENCOUNTER — Ambulatory Visit
Admission: RE | Admit: 2018-08-21 | Discharge: 2018-08-21 | Disposition: A | Discharge status: Home / Self Care | Payer: Medicare Other | Source: Ambulatory Visit | Attending: Radiation Oncology | Admitting: Radiation Oncology

## 2018-08-21 ENCOUNTER — Inpatient Hospital Stay: Payer: Medicare Other

## 2018-08-21 ENCOUNTER — Inpatient Hospital Stay (HOSPITAL_BASED_OUTPATIENT_CLINIC_OR_DEPARTMENT_OTHER): Payer: Medicare Other | Admitting: Oncology

## 2018-08-21 VITALS — BP 142/77 | HR 88 | Temp 98.2°F | Resp 18 | Ht 70.0 in | Wt 187.6 lb

## 2018-08-21 DIAGNOSIS — Z5111 Encounter for antineoplastic chemotherapy: Secondary | ICD-10-CM | POA: Diagnosis not present

## 2018-08-21 DIAGNOSIS — Z7189 Other specified counseling: Secondary | ICD-10-CM | POA: Diagnosis not present

## 2018-08-21 DIAGNOSIS — C679 Malignant neoplasm of bladder, unspecified: Secondary | ICD-10-CM

## 2018-08-21 DIAGNOSIS — C673 Malignant neoplasm of anterior wall of bladder: Secondary | ICD-10-CM

## 2018-08-21 LAB — CMP (CANCER CENTER ONLY)
ALBUMIN: 3.6 g/dL (ref 3.5–5.0)
ALT: 11 U/L (ref 0–44)
AST: 12 U/L — AB (ref 15–41)
Alkaline Phosphatase: 62 U/L (ref 38–126)
Anion gap: 9 (ref 5–15)
BUN: 16 mg/dL (ref 8–23)
CHLORIDE: 108 mmol/L (ref 98–111)
CO2: 26 mmol/L (ref 22–32)
Calcium: 9.6 mg/dL (ref 8.9–10.3)
Creatinine: 1.3 mg/dL — ABNORMAL HIGH (ref 0.61–1.24)
GFR, Est AFR Am: 58 mL/min — ABNORMAL LOW (ref >60)
GFR, Estimated: 50 mL/min — ABNORMAL LOW (ref >60)
GLUCOSE: 105 mg/dL — AB (ref 70–99)
POTASSIUM: 4.6 mmol/L (ref 3.5–5.1)
Sodium: 143 mmol/L (ref 135–145)
Total Bilirubin: 0.3 mg/dL (ref 0.3–1.2)
Total Protein: 6.8 g/dL (ref 6.5–8.1)

## 2018-08-21 LAB — CBC WITH DIFFERENTIAL (CANCER CENTER ONLY)
ABS IMMATURE GRANULOCYTES: 0.2 10*3/uL — AB (ref 0.00–0.07)
Basophils Absolute: 0.1 10*3/uL (ref 0.0–0.1)
Basophils Relative: 1 %
Eosinophils Absolute: 0.2 10*3/uL (ref 0.0–0.5)
Eosinophils Relative: 2 %
HCT: 42 % (ref 39.0–52.0)
Hemoglobin: 13.5 g/dL (ref 13.0–17.0)
Immature Granulocytes: 2 %
LYMPHS PCT: 18 %
Lymphs Abs: 2.1 10*3/uL (ref 0.7–4.0)
MCH: 29.9 pg (ref 26.0–34.0)
MCHC: 32.1 g/dL (ref 30.0–36.0)
MCV: 92.9 fL (ref 80.0–100.0)
MONO ABS: 0.7 10*3/uL (ref 0.1–1.0)
MONOS PCT: 6 %
NEUTROS ABS: 8.3 10*3/uL — AB (ref 1.7–7.7)
Neutrophils Relative %: 71 %
Platelet Count: 313 10*3/uL (ref 150–400)
RBC: 4.52 MIL/uL (ref 4.22–5.81)
RDW: 11.9 % (ref 11.5–15.5)
WBC Count: 11.6 10*3/uL — ABNORMAL HIGH (ref 4.0–10.5)
nRBC: 0 % (ref 0.0–0.2)

## 2018-08-21 NOTE — Progress Notes (Signed)
  Radiation Oncology         [phone] ________________________________  Name: Anthony Pacheco MRN: [id number]  Date: 08/21/2018  DOB: 07-Feb-1936  SIMULATION AND TREATMENT PLANNING NOTE    ICD-10-CM   1. 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 He was found to have T2 lesion with grade 3 histology  NARRATIVE:  The patient was brought to the [facility].  Identity was confirmed.  All relevant records and images related to the planned course of therapy were reviewed.  The patient freely provided informed written consent to proceed with treatment after reviewing the details related to the planned course of therapy. The consent form was witnessed and verified by the simulation staff.  Then, the patient was set-up in a stable reproducible  supine position for radiation therapy.  CT images were obtained.  Surface markings were placed.  The CT images were loaded into the planning software.  Then the target and avoidance structures were contoured.  Treatment planning then occurred.  The radiation prescription was entered and confirmed.  Then, I designed and supervised the construction of a total of 2 medically necessary complex treatment devices.  I have requested : Intensity Modulated Radiotherapy (IMRT) is medically necessary for this case for the following reason:  Small bowel sparing, femoral head and pubic bone sparing.  I have ordered:dose calc.  PLAN:  The patient will receive 45 Gy in 25 fractions directed at the pelvis area followed by a boost to the bladder mass for a cumulative dose of 60.2 - 64.8 gray depending on dose constraints including the small bowel and pubic bone.   Special Treatment Procedure Note: The patient will be receiving radiosensitizing chemotherapy. Given the  potential of increased toxicities related to combined therapy and the necessity for close monitoring of the patient and blood work, this constitutes a special treatment procedure. -----------------------------------  Blair Promise, PhD, MD  This document serves as a record of services personally performed by Gery Pray, MD. It was created on his behalf by Mary-Margaret Loma Messing, a trained medical scribe. The creation of this record is based on the scribe's personal observations and the provider's statements to them. This document has been checked and approved by the attending provider.

## 2018-08-21 NOTE — Progress Notes (Signed)
Hematology and Oncology Follow Up Visit  KRYSTAL DELDUCA [id number] 1936/03/24 82 y.o. 08/21/2018 8:25 AM [name], [name], MDMitchell, [name], MD   Principle Diagnosis: 82 year old with a bladder cancer diagnosed in October 2019.  He was found to have a T2N0 without any evidence of metastatic disease.   Prior Therapy:  TURBT on July 18, 2018.  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  He was found to have T2 lesion with grade 3 histology.    Current therapy: Under evaluation to start definitive therapy with radiation and chemotherapy utilizing weekly carboplatin.  He received the first week of carboplatin on 08/14/2018.  Chemotherapy  Interim History: [name] presents today for a follow-up visit.  Since the last visit, he was evaluated by Dr. Sondra Come and scheduled to start radiation first week of December.  He tolerated the first week of carboplatin without any major complications.  He denies any nausea, fatigue or infusion related complications.  His energy remain excellent and has not reported any abdominal distention or hematuria.  He denies any changes in his appetite or weight loss.  He does not report any headaches, blurry vision, syncope or seizures. Does not report any fevers, chills or sweats.  Does not report any cough, wheezing or hemoptysis.  Does not report any chest pain, palpitation, orthopnea or leg edema.  Does not report any nausea, vomiting or abdominal pain.  Does not report any constipation or diarrhea.  Does not report any skeletal complaints.    Does not report frequency, urgency or hematuria.  Does not report any skin rashes or lesions.  Does not report any lymphadenopathy or petechiae.  Does not report any anxiety or depression.  Remaining review of systems is negative.    Medications: I have reviewed the patient's current medications.  Current Outpatient Medications   Medication Sig Dispense Refill  . levothyroxine (SYNTHROID, LEVOTHROID) 100 MCG tablet Take 100 mcg by mouth daily.  3  . prochlorperazine (COMPAZINE) 10 MG tablet Take 1 tablet (10 mg total) by mouth every 6 (six) hours as needed for nausea or vomiting. 30 tablet 0   No current facility-administered medications for this visit.      Allergies: No Known Allergies  Past Medical History, Surgical history, Social history, and Family History were reviewed and updated.    Physical Exam: Blood pressure (!) 142/77, pulse 88, temperature 98.2 F (36.8 C), temperature source Oral, resp. rate 18, height 5\' 10"  (1.778 m), weight 187 lb 9.6 oz (85.1 kg), SpO2 100 %.   ECOG: 1 General appearance: alert and cooperative appeared without distress. Head: Normocephalic, without obvious abnormality Oropharynx: No oral thrush or ulcers. Eyes: No scleral icterus.  Pupils are equal and round reactive to light. Lymph nodes: Cervical, supraclavicular, and axillary nodes normal. Heart:regular rate and rhythm, S1, S2 normal, no murmur, click, rub or gallop Lung:chest clear, no wheezing, rales, normal symmetric air entry Abdomin: soft, non-tender, without masses or organomegaly. Neurological: No motor, sensory deficits.  Intact deep tendon reflexes. Skin: No rashes or lesions.  No ecchymosis or petechiae. Musculoskeletal: No joint deformity or effusion. Psychiatric: Mood and affect are appropriate.    Lab Results: Lab Results  Component Value Date   WBC 10.2 08/14/2018   HGB 14.1 08/14/2018   HCT 44.0 08/14/2018   MCV 93.2 08/14/2018   PLT 231 08/14/2018     Chemistry      Component Value Date/Time   NA 142 08/14/2018 0908  K 4.0 08/14/2018 0908   CL 108 08/14/2018 0908   CO2 24 08/14/2018 0908   BUN 15 08/14/2018 0908   CREATININE 1.31 (H) 08/14/2018 0908      Component Value Date/Time   CALCIUM 9.4 08/14/2018 0908   ALKPHOS 68 08/14/2018 0908   AST 11 (L) 08/14/2018 0908   ALT 8  08/14/2018 0908   BILITOT 0.6 08/14/2018 0908       Radiological Studies: IMPRESSION: 1. 1.7 x 3.6 cm soft tissue along the RIGHT anterior bladder wall-question neoplasm and/or surgical changes. No evidence of synchronous uroepithelial malignancy, lymphadenopathy or metastatic disease. 2. Small bilateral inguinal hernias containing fat 3.  Aortic Atherosclerosis (ICD10-I70.0).    Impression and Plan:  82 year old man with:  1.    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  CT scan obtained on 08/07/2018 were personally reviewed and showed no evidence of metastatic disease.   The natural course of this disease and treatment options were reviewed today.  Primary surgical therapy after neoadjuvant chemotherapy versus definitive therapy with radiation concomitantly with radiosensitizing chemotherapy were discussed.  He is agreeable to continue with definitive radiation therapy and weekly carboplatin.  The plan is to resume weekly carboplatin to coincide with radiation therapy which is scheduled to start on August 28, 2018.     2.  IV access: Peripheral veins remain adequate at this time no need for Port-A-Cath.  3.  Antiemetics: No issues with nausea or vomiting at this time.  He has Compazine with instruction how to use it.  4.  Kidney function surveillance: Kidney function remains stable and adequate at this time.  No changes.  5.  Goals of care and prognosis: Therapy remains curative at this time.  Aggressive measures are recommended.  6.  Follow-up: Will be weekly for chemotherapy will have an MD follow-up in 2 weeks.  15  minutes was spent with the patient face-to-face today.  More than 50% of time was dedicated to discussing his disease status, imaging studies and answering questions regarding future plan of care.    Zola Button, MD 11/25/20198:25 AM

## 2018-08-28 ENCOUNTER — Ambulatory Visit: Payer: Medicare Other

## 2018-08-28 ENCOUNTER — Other Ambulatory Visit: Payer: Medicare Other

## 2018-08-31 DIAGNOSIS — C673 Malignant neoplasm of anterior wall of bladder: Secondary | ICD-10-CM | POA: Diagnosis not present

## 2018-09-04 ENCOUNTER — Inpatient Hospital Stay: Payer: Medicare Other

## 2018-09-04 ENCOUNTER — Ambulatory Visit
Admission: RE | Admit: 2018-09-04 | Discharge: 2018-09-04 | Disposition: A | Discharge status: Home / Self Care | Payer: Medicare Other | Source: Ambulatory Visit | Attending: Radiation Oncology | Admitting: Radiation Oncology

## 2018-09-04 ENCOUNTER — Telehealth: Payer: Self-pay

## 2018-09-04 ENCOUNTER — Inpatient Hospital Stay: Payer: Medicare Other | Attending: Oncology | Admitting: Oncology

## 2018-09-04 VITALS — BP 140/74 | HR 92 | Temp 97.9°F | Resp 18 | Ht 70.0 in | Wt 187.0 lb

## 2018-09-04 DIAGNOSIS — C679 Malignant neoplasm of bladder, unspecified: Secondary | ICD-10-CM | POA: Insufficient documentation

## 2018-09-04 DIAGNOSIS — Z5111 Encounter for antineoplastic chemotherapy: Secondary | ICD-10-CM | POA: Diagnosis not present

## 2018-09-04 DIAGNOSIS — C673 Malignant neoplasm of anterior wall of bladder: Secondary | ICD-10-CM

## 2018-09-04 DIAGNOSIS — Z7189 Other specified counseling: Secondary | ICD-10-CM

## 2018-09-04 LAB — CBC WITH DIFFERENTIAL (CANCER CENTER ONLY)
Abs Immature Granulocytes: 0.03 10*3/uL (ref 0.00–0.07)
Basophils Absolute: 0.1 10*3/uL (ref 0.0–0.1)
Basophils Relative: 1 %
Eosinophils Absolute: 0.2 10*3/uL (ref 0.0–0.5)
Eosinophils Relative: 3 %
HCT: 42.9 % (ref 39.0–52.0)
Hemoglobin: 13.6 g/dL (ref 13.0–17.0)
Immature Granulocytes: 0 %
Lymphocytes Relative: 25 %
Lymphs Abs: 1.8 10*3/uL (ref 0.7–4.0)
MCH: 29.6 pg (ref 26.0–34.0)
MCHC: 31.7 g/dL (ref 30.0–36.0)
MCV: 93.5 fL (ref 80.0–100.0)
Monocytes Absolute: 0.6 10*3/uL (ref 0.1–1.0)
Monocytes Relative: 8 %
NEUTROS PCT: 63 %
Neutro Abs: 4.5 10*3/uL (ref 1.7–7.7)
Platelet Count: 238 10*3/uL (ref 150–400)
RBC: 4.59 MIL/uL (ref 4.22–5.81)
RDW: 12.6 % (ref 11.5–15.5)
WBC Count: 7.1 10*3/uL (ref 4.0–10.5)
nRBC: 0 % (ref 0.0–0.2)

## 2018-09-04 LAB — CMP (CANCER CENTER ONLY)
ALT: 9 U/L (ref 0–44)
ANION GAP: 10 (ref 5–15)
AST: 13 U/L — ABNORMAL LOW (ref 15–41)
Albumin: 3.8 g/dL (ref 3.5–5.0)
Alkaline Phosphatase: 55 U/L (ref 38–126)
BUN: 17 mg/dL (ref 8–23)
CO2: 24 mmol/L (ref 22–32)
Calcium: 9.2 mg/dL (ref 8.9–10.3)
Chloride: 109 mmol/L (ref 98–111)
Creatinine: 1.34 mg/dL — ABNORMAL HIGH (ref 0.61–1.24)
GFR, EST NON AFRICAN AMERICAN: 50 mL/min — AB (ref >60)
GFR, Est AFR Am: 58 mL/min — ABNORMAL LOW (ref >60)
Glucose, Bld: 105 mg/dL — ABNORMAL HIGH (ref 70–99)
Potassium: 4.1 mmol/L (ref 3.5–5.1)
Sodium: 143 mmol/L (ref 135–145)
Total Bilirubin: 0.5 mg/dL (ref 0.3–1.2)
Total Protein: 7.1 g/dL (ref 6.5–8.1)

## 2018-09-04 MED ORDER — DEXAMETHASONE SODIUM PHOSPHATE 10 MG/ML IJ SOLN
INTRAMUSCULAR | Status: AC
  Filled 2018-09-04: qty 1

## 2018-09-04 MED ORDER — DEXAMETHASONE SODIUM PHOSPHATE 10 MG/ML IJ SOLN
10.0000 mg | Freq: Once | INTRAMUSCULAR | Status: AC
  Administered 2018-09-04: 10 mg via INTRAVENOUS

## 2018-09-04 MED ORDER — SODIUM CHLORIDE 0.9 % IV SOLN
160.0000 mg | Freq: Once | INTRAVENOUS | Status: AC
  Administered 2018-09-04: 160 mg via INTRAVENOUS
  Filled 2018-09-04: qty 16

## 2018-09-04 MED ORDER — PALONOSETRON HCL INJECTION 0.25 MG/5ML
INTRAVENOUS | Status: AC
  Filled 2018-09-04: qty 5

## 2018-09-04 MED ORDER — SODIUM CHLORIDE 0.9 % IV SOLN
Freq: Once | INTRAVENOUS | Status: AC
  Administered 2018-09-04: 10:00:00 via INTRAVENOUS
  Filled 2018-09-04: qty 250

## 2018-09-04 MED ORDER — PALONOSETRON HCL INJECTION 0.25 MG/5ML
0.2500 mg | Freq: Once | INTRAVENOUS | Status: AC
  Administered 2018-09-04: 0.25 mg via INTRAVENOUS

## 2018-09-04 NOTE — Progress Notes (Signed)
Hematology and Oncology Follow Up Visit  Anthony Pacheco [id number] Sep 13, 1936 82 y.o. 09/04/2018 8:14 AM Anthony Pacheco.Dean, MDMitchell, Pacheco.Anthony Sa, MD   Principle Diagnosis: 82 year old with T2N0 urothelial carcinoma of the bladder diagnosed in October 2019.   Prior Therapy:  TURBT on July 18, 2018.  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  He was found to have T2 lesion with grade 3 histology.    Current therapy: Radiation therapy to the bladder set to start on September 04, 2018.  He is also receiving weekly carboplatin AUC of 2.  Interim History: [name] is here for repeat evaluation.  Since the last visit, he reports no major changes.  He has no recent complaints including no hematuria or flank pain.  He is eating well and has not had any changes in his appetite.  His performance status quality of life remains excellent.  He denies any changes in his bowel habits.  He denies any recent hospitalizations or illnesses.  He does not report any headaches, blurry vision, syncope or seizures.  Denies any confusion or lethargy.  Does not report any fevers, chills or sweats.  Does not report any cough, wheezing or hemoptysis.  Does not report any chest pain, palpitation, orthopnea or leg edema.  Does not report any nausea, vomiting or early satiety. Does not report any changes in bowel habits.  Does not report any arthralgias or myalgias.    Does not report frequency, urgency or hematuria.  Does not report any skin rashes or lesions.  Does not report any bleeding or clotting tendency.  Does not report any mood changes.  Remaining review of systems is negative.    Medications: I have reviewed the patient's current medications.  Current Outpatient Medications  Medication Sig Dispense Refill  . levothyroxine (SYNTHROID, LEVOTHROID) 100 MCG tablet Take 100 mcg by mouth daily.  3  . prochlorperazine (COMPAZINE)  10 MG tablet Take 1 tablet (10 mg total) by mouth every 6 (six) hours as needed for nausea or vomiting. 30 tablet 0   No current facility-administered medications for this visit.      Allergies: No Known Allergies  Past Medical History, Surgical history, Social history, and Family History were reviewed and updated.    Physical Exam: Blood pressure 140/74, pulse 92, temperature 97.9 F (36.6 C), temperature source Oral, resp. rate 18, height 5\' 10"  (1.778 Pacheco), weight 187 lb (84.8 kg), SpO2 98 %.    ECOG: 1   General appearance: Alert, awake without any distress. Head: Atraumatic without abnormalities Oropharynx: Without any thrush or ulcers. Eyes: No scleral icterus. Lymph nodes: No lymphadenopathy noted in the cervical, supraclavicular, or axillary nodes Heart:regular rate and rhythm, without any murmurs or gallops.   Lung: Clear to auscultation without any rhonchi, wheezes or dullness to percussion. Abdomin: Soft, nontender without any shifting dullness or ascites. Musculoskeletal: No clubbing or cyanosis. Neurological: No motor or sensory deficits. Skin: No rashes or lesions.       Lab Results: Lab Results  Component Value Date   WBC 11.6 (H) 08/21/2018   HGB 13.5 08/21/2018   HCT 42.0 08/21/2018   MCV 92.9 08/21/2018   PLT 313 08/21/2018     Chemistry      Component Value Date/Time   NA 143 08/21/2018 0808   K 4.6 08/21/2018 0808   CL 108 08/21/2018 0808   CO2 26 08/21/2018 0808   BUN 16 08/21/2018 0808   CREATININE 1.30 (  H) 08/21/2018 0808      Component Value Date/Time   CALCIUM 9.6 08/21/2018 0808   ALKPHOS 62 08/21/2018 0808   AST 12 (Pacheco) 08/21/2018 0808   ALT 11 08/21/2018 0808   BILITOT 0.3 08/21/2018 0808         Impression and Plan:  82 year old man with:  1.    Bladder cancer diagnosed in October 2019.  He was found to have T2N0 high-grade urothelial without any evidence of metastatic disease.  He is set to start curative therapy  with radiation and weekly carboplatin.  Risks and benefits of this approach was reviewed again.  Long-term complication associated with carboplatin chemotherapy was discussed.  These complications include nausea, vomiting, myelosuppression, renal dysfunction as well as infusion related complications.  He is agreeable to proceed at this time.     2.  IV access: Peripheral veins will continue to be in use at this time.  3.  Antiemetics: Antiemetics in the form of Compazine are available to him at this time.  No issues reported.  4.  Kidney function surveillance: Kidney function remains stable at this time.  We will continue to monitor on carboplatin therapy.  5.  Goals of care and prognosis: His disease remains incurable aggressive therapy is warranted.  6.  Follow-up: He will receive weekly carboplatin and have MD follow-up on December 30.  15  minutes was spent with the patient face-to-face today.  More than 50% of time was dedicated to reviewing his disease status, treatment options, laboratory data and couple case related to therapy.    Zola Button, MD 12/9/20198:14 AM

## 2018-09-04 NOTE — Telephone Encounter (Signed)
Printed avs and calender of upcoming appointment. Per 1/29 los 

## 2018-09-04 NOTE — Progress Notes (Signed)
  Radiation Oncology         [phone] ________________________________  Name: Anthony Pacheco MRN: [id number]  Date: 09/04/2018  DOB: 02/03/1936  Simulation Verification Note    ICD-10-CM   1. Malignant neoplasm of anterior wall of urinary bladder (HCC) C67.3     Status: outpatient  NARRATIVE: The patient was brought to the treatment unit and placed in the planned treatment position. The clinical setup was verified. Then port films were obtained and uploaded to the radiation oncology medical record software.  The treatment beams were carefully compared against the planned radiation fields. The position location and shape of the radiation fields was reviewed. They targeted volume of tissue appears to be appropriately covered by the radiation beams. Organs at risk appear to be excluded as planned.  Based on my personal review, I approved the simulation verification. The patient's treatment will proceed as planned.  -----------------------------------  Blair Promise, PhD, MD

## 2018-09-04 NOTE — Patient Instructions (Addendum)
[facility] Discharge Instructions for Patients Receiving Chemotherapy  Today you received the following chemotherapy agents Carboplatin  To help prevent nausea and vomiting after your treatment, we encourage you to take your nausea medication as directed   If you develop nausea and vomiting that is not controlled by your nausea medication, call the clinic.   BELOW ARE SYMPTOMS THAT SHOULD BE REPORTED IMMEDIATELY:  *FEVER GREATER THAN 100.5 F  *CHILLS WITH OR WITHOUT FEVER  NAUSEA AND VOMITING THAT IS NOT CONTROLLED WITH YOUR NAUSEA MEDICATION  *UNUSUAL SHORTNESS OF BREATH  *UNUSUAL BRUISING OR BLEEDING  TENDERNESS IN MOUTH AND THROAT WITH OR WITHOUT PRESENCE OF ULCERS  *URINARY PROBLEMS  *BOWEL PROBLEMS  UNUSUAL RASH Items with * indicate a potential emergency and should be followed up as soon as possible.  Feel free to call the clinic should you have any questions or concerns. The clinic phone number is [phone].  Please show the CHEMO ALERT CARD at check-in to the Emergency Department and triage nurse.   

## 2018-09-05 ENCOUNTER — Ambulatory Visit
Admission: RE | Admit: 2018-09-05 | Discharge: 2018-09-05 | Disposition: A | Discharge status: Home / Self Care | Payer: Medicare Other | Source: Ambulatory Visit | Attending: Radiation Oncology | Admitting: Radiation Oncology

## 2018-09-05 DIAGNOSIS — C673 Malignant neoplasm of anterior wall of bladder: Secondary | ICD-10-CM | POA: Diagnosis not present

## 2018-09-05 NOTE — Progress Notes (Signed)
Pt here for patient teaching.  Pt given Radiation and You booklet and skin care instructions.  Reviewed areas of pertinence such as diarrhea, fatigue, hair loss, nausea and vomiting, sexual and fertility changes, skin changes and urinary and bladder changes . Pt able to give teach back of to pat skin, use unscented/gentle soap, use baby wipes, have Imodium on hand and drink plenty of water,avoid applying anything to skin within 4 hours of treatment. Pt verbalized understanding of information given and will contact nursing with any questions or concerns.     [email or web address]     [name], RN BSN

## 2018-09-06 ENCOUNTER — Ambulatory Visit
Admission: RE | Admit: 2018-09-06 | Discharge: 2018-09-06 | Disposition: A | Discharge status: Home / Self Care | Payer: Medicare Other | Source: Ambulatory Visit | Attending: Radiation Oncology | Admitting: Radiation Oncology

## 2018-09-06 DIAGNOSIS — C673 Malignant neoplasm of anterior wall of bladder: Secondary | ICD-10-CM | POA: Diagnosis not present

## 2018-09-07 ENCOUNTER — Ambulatory Visit
Admission: RE | Admit: 2018-09-07 | Discharge: 2018-09-07 | Disposition: A | Discharge status: Home / Self Care | Payer: Medicare Other | Source: Ambulatory Visit | Attending: Radiation Oncology | Admitting: Radiation Oncology

## 2018-09-07 DIAGNOSIS — C673 Malignant neoplasm of anterior wall of bladder: Secondary | ICD-10-CM | POA: Diagnosis not present

## 2018-09-08 ENCOUNTER — Ambulatory Visit
Admission: RE | Admit: 2018-09-08 | Discharge: 2018-09-08 | Disposition: A | Discharge status: Home / Self Care | Payer: Medicare Other | Source: Ambulatory Visit | Attending: Radiation Oncology | Admitting: Radiation Oncology

## 2018-09-08 DIAGNOSIS — C673 Malignant neoplasm of anterior wall of bladder: Secondary | ICD-10-CM | POA: Diagnosis not present

## 2018-09-11 ENCOUNTER — Ambulatory Visit
Admission: RE | Admit: 2018-09-11 | Discharge: 2018-09-11 | Disposition: A | Discharge status: Home / Self Care | Payer: Medicare Other | Source: Ambulatory Visit | Attending: Radiation Oncology | Admitting: Radiation Oncology

## 2018-09-11 ENCOUNTER — Inpatient Hospital Stay: Payer: Medicare Other

## 2018-09-11 VITALS — BP 133/75 | HR 79 | Temp 97.7°F | Resp 16

## 2018-09-11 DIAGNOSIS — C679 Malignant neoplasm of bladder, unspecified: Secondary | ICD-10-CM

## 2018-09-11 DIAGNOSIS — C673 Malignant neoplasm of anterior wall of bladder: Secondary | ICD-10-CM | POA: Diagnosis not present

## 2018-09-11 DIAGNOSIS — Z5111 Encounter for antineoplastic chemotherapy: Secondary | ICD-10-CM | POA: Diagnosis not present

## 2018-09-11 LAB — CBC WITH DIFFERENTIAL (CANCER CENTER ONLY)
Abs Immature Granulocytes: 0.07 10*3/uL (ref 0.00–0.07)
Basophils Absolute: 0.1 10*3/uL (ref 0.0–0.1)
Basophils Relative: 1 %
Eosinophils Absolute: 0.2 10*3/uL (ref 0.0–0.5)
Eosinophils Relative: 4 %
HCT: 42 % (ref 39.0–52.0)
Hemoglobin: 13.6 g/dL (ref 13.0–17.0)
Immature Granulocytes: 1 %
Lymphocytes Relative: 21 %
Lymphs Abs: 1.2 10*3/uL (ref 0.7–4.0)
MCH: 30 pg (ref 26.0–34.0)
MCHC: 32.4 g/dL (ref 30.0–36.0)
MCV: 92.7 fL (ref 80.0–100.0)
MONOS PCT: 8 %
Monocytes Absolute: 0.5 10*3/uL (ref 0.1–1.0)
Neutro Abs: 3.7 10*3/uL (ref 1.7–7.7)
Neutrophils Relative %: 65 %
Platelet Count: 185 10*3/uL (ref 150–400)
RBC: 4.53 MIL/uL (ref 4.22–5.81)
RDW: 12.7 % (ref 11.5–15.5)
WBC Count: 5.7 10*3/uL (ref 4.0–10.5)
nRBC: 0 % (ref 0.0–0.2)

## 2018-09-11 LAB — CMP (CANCER CENTER ONLY)
ALK PHOS: 54 U/L (ref 38–126)
ALT: 11 U/L (ref 0–44)
AST: 13 U/L — ABNORMAL LOW (ref 15–41)
Albumin: 3.8 g/dL (ref 3.5–5.0)
Anion gap: 9 (ref 5–15)
BUN: 15 mg/dL (ref 8–23)
CALCIUM: 9.3 mg/dL (ref 8.9–10.3)
CO2: 24 mmol/L (ref 22–32)
Chloride: 109 mmol/L (ref 98–111)
Creatinine: 1.28 mg/dL — ABNORMAL HIGH (ref 0.61–1.24)
GFR, Est AFR Am: >60 mL/min (ref >60)
GFR, Estimated: 53 mL/min — ABNORMAL LOW (ref >60)
GLUCOSE: 99 mg/dL (ref 70–99)
Potassium: 4.2 mmol/L (ref 3.5–5.1)
Sodium: 142 mmol/L (ref 135–145)
Total Bilirubin: 0.7 mg/dL (ref 0.3–1.2)
Total Protein: 6.8 g/dL (ref 6.5–8.1)

## 2018-09-11 MED ORDER — PALONOSETRON HCL INJECTION 0.25 MG/5ML
0.2500 mg | Freq: Once | INTRAVENOUS | Status: AC
  Administered 2018-09-11: 0.25 mg via INTRAVENOUS

## 2018-09-11 MED ORDER — PALONOSETRON HCL INJECTION 0.25 MG/5ML
INTRAVENOUS | Status: AC
  Filled 2018-09-11: qty 5

## 2018-09-11 MED ORDER — SODIUM CHLORIDE 0.9 % IV SOLN
Freq: Once | INTRAVENOUS | Status: AC
  Administered 2018-09-11: 10:00:00 via INTRAVENOUS
  Filled 2018-09-11: qty 250

## 2018-09-11 MED ORDER — SODIUM CHLORIDE 0.9 % IV SOLN
160.0000 mg | Freq: Once | INTRAVENOUS | Status: AC
  Administered 2018-09-11: 160 mg via INTRAVENOUS
  Filled 2018-09-11: qty 16

## 2018-09-11 MED ORDER — DEXAMETHASONE SODIUM PHOSPHATE 10 MG/ML IJ SOLN
10.0000 mg | Freq: Once | INTRAMUSCULAR | Status: AC
  Administered 2018-09-11: 10 mg via INTRAVENOUS

## 2018-09-11 MED ORDER — DEXAMETHASONE SODIUM PHOSPHATE 10 MG/ML IJ SOLN
INTRAMUSCULAR | Status: AC
  Filled 2018-09-11: qty 1

## 2018-09-11 NOTE — Patient Instructions (Signed)
[facility] Discharge Instructions for Patients Receiving Chemotherapy  Today you received the following chemotherapy agents: Carboplatin.  To help prevent nausea and vomiting after your treatment, we encourage you to take your nausea medication as prescribed.   If you develop nausea and vomiting that is not controlled by your nausea medication, call the clinic.   BELOW ARE SYMPTOMS THAT SHOULD BE REPORTED IMMEDIATELY:  *FEVER GREATER THAN 100.5 F  *CHILLS WITH OR WITHOUT FEVER  NAUSEA AND VOMITING THAT IS NOT CONTROLLED WITH YOUR NAUSEA MEDICATION  *UNUSUAL SHORTNESS OF BREATH  *UNUSUAL BRUISING OR BLEEDING  TENDERNESS IN MOUTH AND THROAT WITH OR WITHOUT PRESENCE OF ULCERS  *URINARY PROBLEMS  *BOWEL PROBLEMS  UNUSUAL RASH Items with * indicate a potential emergency and should be followed up as soon as possible.  Feel free to call the clinic should you have any questions or concerns. The clinic phone number is [phone].  Please show the CHEMO ALERT CARD at check-in to the Emergency Department and triage nurse.     

## 2018-09-12 ENCOUNTER — Ambulatory Visit
Admission: RE | Admit: 2018-09-12 | Discharge: 2018-09-12 | Disposition: A | Discharge status: Home / Self Care | Payer: Medicare Other | Source: Ambulatory Visit | Attending: Radiation Oncology | Admitting: Radiation Oncology

## 2018-09-12 ENCOUNTER — Other Ambulatory Visit: Payer: Self-pay | Admitting: Oncology

## 2018-09-12 DIAGNOSIS — C679 Malignant neoplasm of bladder, unspecified: Secondary | ICD-10-CM

## 2018-09-12 DIAGNOSIS — C673 Malignant neoplasm of anterior wall of bladder: Secondary | ICD-10-CM | POA: Diagnosis not present

## 2018-09-13 ENCOUNTER — Telehealth: Payer: Self-pay | Admitting: Radiation Oncology

## 2018-09-13 ENCOUNTER — Ambulatory Visit
Admission: RE | Admit: 2018-09-13 | Discharge: 2018-09-13 | Disposition: A | Discharge status: Home / Self Care | Payer: Medicare Other | Source: Ambulatory Visit | Attending: Radiation Oncology | Admitting: Radiation Oncology

## 2018-09-13 DIAGNOSIS — C673 Malignant neoplasm of anterior wall of bladder: Secondary | ICD-10-CM | POA: Diagnosis not present

## 2018-09-13 NOTE — Telephone Encounter (Signed)
I called Anthony Pacheco to confirm I am working on his appeal for the radiation treatment. I advised I would follow up once I receive the decision from his insurance carrier.  Patient verbalized understanding.

## 2018-09-14 ENCOUNTER — Ambulatory Visit
Admission: RE | Admit: 2018-09-14 | Discharge: 2018-09-14 | Disposition: A | Discharge status: Home / Self Care | Payer: Medicare Other | Source: Ambulatory Visit | Attending: Radiation Oncology | Admitting: Radiation Oncology

## 2018-09-14 DIAGNOSIS — C673 Malignant neoplasm of anterior wall of bladder: Secondary | ICD-10-CM | POA: Diagnosis not present

## 2018-09-15 ENCOUNTER — Ambulatory Visit
Admission: RE | Admit: 2018-09-15 | Discharge: 2018-09-15 | Disposition: A | Discharge status: Home / Self Care | Payer: Medicare Other | Source: Ambulatory Visit | Attending: Radiation Oncology | Admitting: Radiation Oncology

## 2018-09-15 DIAGNOSIS — C673 Malignant neoplasm of anterior wall of bladder: Secondary | ICD-10-CM | POA: Diagnosis not present

## 2018-09-15 NOTE — Telephone Encounter (Signed)
As promised, I called Anthony Pacheco to confirm the appeal for his RT was approved. Patient verbalized understanding.

## 2018-09-18 ENCOUNTER — Inpatient Hospital Stay: Payer: Medicare Other

## 2018-09-18 ENCOUNTER — Ambulatory Visit
Admission: RE | Admit: 2018-09-18 | Discharge: 2018-09-18 | Disposition: A | Discharge status: Home / Self Care | Payer: Medicare Other | Source: Ambulatory Visit | Attending: Radiation Oncology | Admitting: Radiation Oncology

## 2018-09-18 VITALS — BP 133/78 | HR 85 | Temp 97.8°F | Resp 18 | Ht 70.0 in | Wt 184.1 lb

## 2018-09-18 DIAGNOSIS — Z5111 Encounter for antineoplastic chemotherapy: Secondary | ICD-10-CM | POA: Diagnosis not present

## 2018-09-18 DIAGNOSIS — C679 Malignant neoplasm of bladder, unspecified: Secondary | ICD-10-CM

## 2018-09-18 DIAGNOSIS — C673 Malignant neoplasm of anterior wall of bladder: Secondary | ICD-10-CM | POA: Diagnosis not present

## 2018-09-18 LAB — CBC WITH DIFFERENTIAL (CANCER CENTER ONLY)
Abs Immature Granulocytes: 0.03 10*3/uL (ref 0.00–0.07)
Basophils Absolute: 0 10*3/uL (ref 0.0–0.1)
Basophils Relative: 1 %
Eosinophils Absolute: 0.2 10*3/uL (ref 0.0–0.5)
Eosinophils Relative: 4 %
HCT: 40.9 % (ref 39.0–52.0)
Hemoglobin: 13.3 g/dL (ref 13.0–17.0)
Immature Granulocytes: 1 %
Lymphocytes Relative: 15 %
Lymphs Abs: 0.9 10*3/uL (ref 0.7–4.0)
MCH: 30.2 pg (ref 26.0–34.0)
MCHC: 32.5 g/dL (ref 30.0–36.0)
MCV: 93 fL (ref 80.0–100.0)
Monocytes Absolute: 0.4 10*3/uL (ref 0.1–1.0)
Monocytes Relative: 7 %
NEUTROS ABS: 4.3 10*3/uL (ref 1.7–7.7)
Neutrophils Relative %: 72 %
Platelet Count: 150 10*3/uL (ref 150–400)
RBC: 4.4 MIL/uL (ref 4.22–5.81)
RDW: 13.1 % (ref 11.5–15.5)
WBC Count: 5.9 10*3/uL (ref 4.0–10.5)
nRBC: 0 % (ref 0.0–0.2)

## 2018-09-18 LAB — CMP (CANCER CENTER ONLY)
ALT: 20 U/L (ref 0–44)
AST: 14 U/L — ABNORMAL LOW (ref 15–41)
Albumin: 3.8 g/dL (ref 3.5–5.0)
Alkaline Phosphatase: 50 U/L (ref 38–126)
Anion gap: 8 (ref 5–15)
BILIRUBIN TOTAL: 0.7 mg/dL (ref 0.3–1.2)
BUN: 13 mg/dL (ref 8–23)
CO2: 24 mmol/L (ref 22–32)
Calcium: 9 mg/dL (ref 8.9–10.3)
Chloride: 109 mmol/L (ref 98–111)
Creatinine: 1.26 mg/dL — ABNORMAL HIGH (ref 0.61–1.24)
GFR, EST NON AFRICAN AMERICAN: 54 mL/min — AB (ref >60)
GFR, Est AFR Am: >60 mL/min (ref >60)
Glucose, Bld: 111 mg/dL — ABNORMAL HIGH (ref 70–99)
Potassium: 4.2 mmol/L (ref 3.5–5.1)
Sodium: 141 mmol/L (ref 135–145)
TOTAL PROTEIN: 6.7 g/dL (ref 6.5–8.1)

## 2018-09-18 MED ORDER — SODIUM CHLORIDE 0.9 % IV SOLN
Freq: Once | INTRAVENOUS | Status: AC
  Administered 2018-09-18: 11:00:00 via INTRAVENOUS
  Filled 2018-09-18: qty 250

## 2018-09-18 MED ORDER — SODIUM CHLORIDE 0.9 % IV SOLN
160.0000 mg | Freq: Once | INTRAVENOUS | Status: AC
  Administered 2018-09-18: 160 mg via INTRAVENOUS
  Filled 2018-09-18: qty 16

## 2018-09-18 MED ORDER — PALONOSETRON HCL INJECTION 0.25 MG/5ML
0.2500 mg | Freq: Once | INTRAVENOUS | Status: AC
  Administered 2018-09-18: 0.25 mg via INTRAVENOUS

## 2018-09-18 MED ORDER — DEXAMETHASONE SODIUM PHOSPHATE 10 MG/ML IJ SOLN
INTRAMUSCULAR | Status: AC
  Filled 2018-09-18: qty 1

## 2018-09-18 MED ORDER — PALONOSETRON HCL INJECTION 0.25 MG/5ML
INTRAVENOUS | Status: AC
  Filled 2018-09-18: qty 5

## 2018-09-18 MED ORDER — DEXAMETHASONE SODIUM PHOSPHATE 10 MG/ML IJ SOLN
10.0000 mg | Freq: Once | INTRAMUSCULAR | Status: AC
  Administered 2018-09-18: 10 mg via INTRAVENOUS

## 2018-09-18 NOTE — Patient Instructions (Signed)
[facility] Discharge Instructions for Patients Receiving Chemotherapy  Today you received the following chemotherapy agents: Carboplatin.  To help prevent nausea and vomiting after your treatment, we encourage you to take your nausea medication as prescribed.   If you develop nausea and vomiting that is not controlled by your nausea medication, call the clinic.   BELOW ARE SYMPTOMS THAT SHOULD BE REPORTED IMMEDIATELY:  *FEVER GREATER THAN 100.5 F  *CHILLS WITH OR WITHOUT FEVER  NAUSEA AND VOMITING THAT IS NOT CONTROLLED WITH YOUR NAUSEA MEDICATION  *UNUSUAL SHORTNESS OF BREATH  *UNUSUAL BRUISING OR BLEEDING  TENDERNESS IN MOUTH AND THROAT WITH OR WITHOUT PRESENCE OF ULCERS  *URINARY PROBLEMS  *BOWEL PROBLEMS  UNUSUAL RASH Items with * indicate a potential emergency and should be followed up as soon as possible.  Feel free to call the clinic should you have any questions or concerns. The clinic phone number is [phone].  Please show the CHEMO ALERT CARD at check-in to the Emergency Department and triage nurse.     

## 2018-09-19 ENCOUNTER — Ambulatory Visit
Admission: RE | Admit: 2018-09-19 | Discharge: 2018-09-19 | Disposition: A | Discharge status: Home / Self Care | Payer: Medicare Other | Source: Ambulatory Visit | Attending: Radiation Oncology | Admitting: Radiation Oncology

## 2018-09-19 DIAGNOSIS — C673 Malignant neoplasm of anterior wall of bladder: Secondary | ICD-10-CM | POA: Diagnosis not present

## 2018-09-21 ENCOUNTER — Ambulatory Visit
Admission: RE | Admit: 2018-09-21 | Discharge: 2018-09-21 | Disposition: A | Discharge status: Home / Self Care | Payer: Medicare Other | Source: Ambulatory Visit | Attending: Radiation Oncology | Admitting: Radiation Oncology

## 2018-09-21 DIAGNOSIS — C673 Malignant neoplasm of anterior wall of bladder: Secondary | ICD-10-CM | POA: Diagnosis not present

## 2018-09-22 ENCOUNTER — Ambulatory Visit
Admission: RE | Admit: 2018-09-22 | Discharge: 2018-09-22 | Disposition: A | Discharge status: Home / Self Care | Payer: Medicare Other | Source: Ambulatory Visit | Attending: Radiation Oncology | Admitting: Radiation Oncology

## 2018-09-22 DIAGNOSIS — C673 Malignant neoplasm of anterior wall of bladder: Secondary | ICD-10-CM | POA: Diagnosis not present

## 2018-09-25 ENCOUNTER — Inpatient Hospital Stay: Payer: Medicare Other

## 2018-09-25 ENCOUNTER — Inpatient Hospital Stay: Payer: Medicare Other | Admitting: Oncology

## 2018-09-25 ENCOUNTER — Ambulatory Visit
Admission: RE | Admit: 2018-09-25 | Discharge: 2018-09-25 | Disposition: A | Discharge status: Home / Self Care | Payer: Medicare Other | Source: Ambulatory Visit | Attending: Radiation Oncology | Admitting: Radiation Oncology

## 2018-09-25 VITALS — BP 127/66 | HR 93 | Temp 98.0°F | Resp 18 | Ht 70.0 in | Wt 186.1 lb

## 2018-09-25 DIAGNOSIS — C679 Malignant neoplasm of bladder, unspecified: Secondary | ICD-10-CM

## 2018-09-25 DIAGNOSIS — Z5111 Encounter for antineoplastic chemotherapy: Secondary | ICD-10-CM | POA: Diagnosis not present

## 2018-09-25 DIAGNOSIS — C673 Malignant neoplasm of anterior wall of bladder: Secondary | ICD-10-CM | POA: Diagnosis not present

## 2018-09-25 DIAGNOSIS — Z95828 Presence of other vascular implants and grafts: Secondary | ICD-10-CM

## 2018-09-25 LAB — CBC WITH DIFFERENTIAL (CANCER CENTER ONLY)
Abs Immature Granulocytes: 0.02 10*3/uL (ref 0.00–0.07)
Basophils Absolute: 0 10*3/uL (ref 0.0–0.1)
Basophils Relative: 1 %
EOS PCT: 5 %
Eosinophils Absolute: 0.2 10*3/uL (ref 0.0–0.5)
HCT: 40.4 % (ref 39.0–52.0)
HEMOGLOBIN: 13 g/dL (ref 13.0–17.0)
Immature Granulocytes: 0 %
Lymphocytes Relative: 15 %
Lymphs Abs: 0.7 10*3/uL (ref 0.7–4.0)
MCH: 30.2 pg (ref 26.0–34.0)
MCHC: 32.2 g/dL (ref 30.0–36.0)
MCV: 94 fL (ref 80.0–100.0)
Monocytes Absolute: 0.4 10*3/uL (ref 0.1–1.0)
Monocytes Relative: 8 %
Neutro Abs: 3.5 10*3/uL (ref 1.7–7.7)
Neutrophils Relative %: 71 %
Platelet Count: 145 10*3/uL — ABNORMAL LOW (ref 150–400)
RBC: 4.3 MIL/uL (ref 4.22–5.81)
RDW: 13.5 % (ref 11.5–15.5)
WBC Count: 4.9 10*3/uL (ref 4.0–10.5)
nRBC: 0 % (ref 0.0–0.2)

## 2018-09-25 LAB — CMP (CANCER CENTER ONLY)
ALT: 16 U/L (ref 0–44)
AST: 13 U/L — ABNORMAL LOW (ref 15–41)
Albumin: 3.7 g/dL (ref 3.5–5.0)
Alkaline Phosphatase: 48 U/L (ref 38–126)
Anion gap: 9 (ref 5–15)
BUN: 11 mg/dL (ref 8–23)
CHLORIDE: 110 mmol/L (ref 98–111)
CO2: 24 mmol/L (ref 22–32)
Calcium: 9 mg/dL (ref 8.9–10.3)
Creatinine: 1.2 mg/dL (ref 0.61–1.24)
GFR, Est AFR Am: >60 mL/min (ref >60)
GFR, Estimated: 57 mL/min — ABNORMAL LOW (ref >60)
Glucose, Bld: 118 mg/dL — ABNORMAL HIGH (ref 70–99)
Potassium: 4 mmol/L (ref 3.5–5.1)
Sodium: 143 mmol/L (ref 135–145)
Total Bilirubin: 0.4 mg/dL (ref 0.3–1.2)
Total Protein: 6.6 g/dL (ref 6.5–8.1)

## 2018-09-25 LAB — MAGNESIUM: Magnesium: 1.6 mg/dL — ABNORMAL LOW (ref 1.7–2.4)

## 2018-09-25 MED ORDER — DEXAMETHASONE SODIUM PHOSPHATE 10 MG/ML IJ SOLN
10.0000 mg | Freq: Once | INTRAMUSCULAR | Status: AC
  Administered 2018-09-25: 10 mg via INTRAVENOUS

## 2018-09-25 MED ORDER — PALONOSETRON HCL INJECTION 0.25 MG/5ML
0.2500 mg | Freq: Once | INTRAVENOUS | Status: AC
  Administered 2018-09-25: 0.25 mg via INTRAVENOUS

## 2018-09-25 MED ORDER — SODIUM CHLORIDE 0.9 % IV SOLN
Freq: Once | INTRAVENOUS | Status: AC
  Administered 2018-09-25: 10:00:00 via INTRAVENOUS
  Filled 2018-09-25: qty 250

## 2018-09-25 MED ORDER — DEXAMETHASONE SODIUM PHOSPHATE 10 MG/ML IJ SOLN
INTRAMUSCULAR | Status: AC
  Filled 2018-09-25: qty 1

## 2018-09-25 MED ORDER — PALONOSETRON HCL INJECTION 0.25 MG/5ML
INTRAVENOUS | Status: AC
  Filled 2018-09-25: qty 5

## 2018-09-25 MED ORDER — SODIUM CHLORIDE 0.9 % IV SOLN
160.0000 mg | Freq: Once | INTRAVENOUS | Status: AC
  Administered 2018-09-25: 160 mg via INTRAVENOUS
  Filled 2018-09-25: qty 16

## 2018-09-25 NOTE — Patient Instructions (Signed)
[facility] Discharge Instructions for Patients Receiving Chemotherapy  Today you received the following chemotherapy agents: Carboplatin.  To help prevent nausea and vomiting after your treatment, we encourage you to take your nausea medication as prescribed.   If you develop nausea and vomiting that is not controlled by your nausea medication, call the clinic.   BELOW ARE SYMPTOMS THAT SHOULD BE REPORTED IMMEDIATELY:  *FEVER GREATER THAN 100.5 F  *CHILLS WITH OR WITHOUT FEVER  NAUSEA AND VOMITING THAT IS NOT CONTROLLED WITH YOUR NAUSEA MEDICATION  *UNUSUAL SHORTNESS OF BREATH  *UNUSUAL BRUISING OR BLEEDING  TENDERNESS IN MOUTH AND THROAT WITH OR WITHOUT PRESENCE OF ULCERS  *URINARY PROBLEMS  *BOWEL PROBLEMS  UNUSUAL RASH Items with * indicate a potential emergency and should be followed up as soon as possible.  Feel free to call the clinic should you have any questions or concerns. The clinic phone number is [phone].  Please show the CHEMO ALERT CARD at check-in to the Emergency Department and triage nurse.     

## 2018-09-25 NOTE — Progress Notes (Signed)
Hematology and Oncology Follow Up Visit  Anthony Pacheco [id number] 03-10-1936 82 y.o. 09/25/2018 8:32 AM Anthony Dust, [name], MDMitchell, [name], MD   Principle Diagnosis: 82 year old with bladder cancer diagnosed in 2019.  He presented with T2N0 urothelial carcinoma with metastatic disease.  Prior Therapy:  TURBT on July 18, 2018.  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  He was found to have T2 lesion with grade 3 histology.    Current therapy: Radiation therapy with weekly carboplatin AUC of 2 started in December 2019.  Therapy tentatively will conclude on October 16, 2017.  Interim History: [name] presents today for a follow-up.  Since the last visit, he continues to tolerate weekly carboplatin therapy without any complaints.  He reports no nausea, vomiting or infusion related complications.  He denies any worsening neuropathy or worsening diarrhea.  His performance status and quality of life remain excellent.  He does not report any headaches, blurry vision, syncope or seizures.  Denies any alteration of mental status or lethargy.  Does not report any fevers, chills or sweats.  Does not report any cough, wheezing or hemoptysis.  Does not report any chest pain, palpitation, orthopnea or leg edema.  Does not report any nausea, vomiting or distention. Does not report any constipation or diarrhea. Does not report any bone pain or pathological fractures.   Does not report frequency, urgency or hematuria.  Does not report any skin rashes or lesions.  Does not report any ecchymosis or petechiae.  Does not report any anxiety or depression.  Remaining review of systems is negative.    Medications: I have reviewed the patient's current medications.  Current Outpatient Medications  Medication Sig Dispense Refill  . levothyroxine (SYNTHROID, LEVOTHROID) 100 MCG tablet Take 100 mcg by mouth daily.  3  .  prochlorperazine (COMPAZINE) 10 MG tablet Take 1 tablet (10 mg total) by mouth every 6 (six) hours as needed for nausea or vomiting. 30 tablet 0   No current facility-administered medications for this visit.      Allergies: No Known Allergies  Past Medical History, Surgical history, Social history, and Family History were reviewed and updated.    Physical Exam: Blood pressure 127/66, pulse 93, temperature 98 F (36.7 C), temperature source Oral, resp. rate 18, height 5\' 10"  (1.778 m), weight 186 lb 1.6 oz (84.4 kg), SpO2 99 %.    ECOG: 1    General appearance: Comfortable appearing without any discomfort Head: Normocephalic without any trauma Oropharynx: Mucous membranes are moist and pink without any thrush or ulcers. Eyes: Pupils are equal and round reactive to light. Lymph nodes: No cervical, supraclavicular, inguinal or axillary lymphadenopathy.   Heart:regular rate and rhythm.  S1 and S2 without leg edema. Lung: Clear without any rhonchi or wheezes.  No dullness to percussion. Abdomin: Soft, nontender, nondistended with good bowel sounds.  No hepatosplenomegaly. Musculoskeletal: No joint deformity or effusion.  Full range of motion noted. Neurological: No deficits noted on motor, sensory and deep tendon reflex exam. Skin: No petechial rash or dryness.  Appeared moist.         Lab Results: Lab Results  Component Value Date   WBC 4.9 09/25/2018   HGB 13.0 09/25/2018   HCT 40.4 09/25/2018   MCV 94.0 09/25/2018   PLT 145 (L) 09/25/2018     Chemistry      Component Value Date/Time   NA 141 09/18/2018 0819   K 4.2 09/18/2018 7782  CL 109 09/18/2018 0819   CO2 24 09/18/2018 0819   BUN 13 09/18/2018 0819   CREATININE 1.26 (H) 09/18/2018 0819      Component Value Date/Time   CALCIUM 9.0 09/18/2018 0819   ALKPHOS 50 09/18/2018 0819   AST 14 (L) 09/18/2018 0819   ALT 20 09/18/2018 0819   BILITOT 0.7 09/18/2018 0819         Impression and  Plan:  82 year old man with:  1.    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   He is currently receiving definitive therapy with radiation and weekly carboplatin with excellent tolerance at this time.  Risks and benefits of continuing this therapy was reviewed today and he is agreeable to continue.  Long-term complications including neutropenia, renal dysfunction as well as neuropathy were all reviewed.  He is agreeable to continue.  The plan is to conclude therapy on October 16, 2018 and obtain imaging studies subsequent to that.     2.  IV access: Port-A-Cath inserted based on his preference will be used periodically.  Complication associated with this procedure was reviewed today.  These include bleeding, thrombosis and infection.  3.  Antiemetics: No nausea or vomiting reported at this time.  Compazine is available to him.  4.  Kidney function surveillance: Creatinine remains stable with clearance close to baseline.  5.  Goals of care and prognosis: Therapy remains curative at this time and aggressive therapy is warranted given his excellent performance status.  6.  Follow-up: He will continue receive carboplatin on a weekly basis and MD follow-up on October 16, 2017.  25  minutes was spent with the patient face-to-face today.  More than 50% of time was dedicated to discussing disease status, laboratory data review and coordinating plan of care.    Zola Button, MD 12/30/20198:32 AM

## 2018-09-26 ENCOUNTER — Other Ambulatory Visit: Payer: Self-pay | Admitting: Radiology

## 2018-09-26 ENCOUNTER — Ambulatory Visit
Admission: RE | Admit: 2018-09-26 | Discharge: 2018-09-26 | Disposition: A | Discharge status: Home / Self Care | Payer: Medicare Other | Source: Ambulatory Visit | Attending: Radiation Oncology | Admitting: Radiation Oncology

## 2018-09-26 ENCOUNTER — Telehealth: Payer: Self-pay | Admitting: Oncology

## 2018-09-26 ENCOUNTER — Telehealth: Payer: Self-pay | Admitting: *Deleted

## 2018-09-26 DIAGNOSIS — C673 Malignant neoplasm of anterior wall of bladder: Secondary | ICD-10-CM | POA: Diagnosis not present

## 2018-09-26 NOTE — Telephone Encounter (Signed)
Noted  

## 2018-09-26 NOTE — Telephone Encounter (Signed)
Patient calling. States his insurance is refusing payment for port-a-cath insertion. Patient has asked me to cancel his port insertion.

## 2018-09-26 NOTE — Telephone Encounter (Signed)
No los 12/30.

## 2018-09-28 ENCOUNTER — Ambulatory Visit (HOSPITAL_COMMUNITY): Payer: Medicare Other

## 2018-09-28 ENCOUNTER — Inpatient Hospital Stay (HOSPITAL_COMMUNITY): Admission: RE | Admit: 2018-09-28 | Payer: Medicare Other | Source: Ambulatory Visit

## 2018-09-28 ENCOUNTER — Ambulatory Visit
Admission: RE | Admit: 2018-09-28 | Discharge: 2018-09-28 | Disposition: A | Discharge status: Home / Self Care | Payer: Medicare Other | Source: Ambulatory Visit | Attending: Radiation Oncology | Admitting: Radiation Oncology

## 2018-09-28 DIAGNOSIS — C673 Malignant neoplasm of anterior wall of bladder: Secondary | ICD-10-CM | POA: Insufficient documentation

## 2018-09-28 DIAGNOSIS — Z510 Encounter for antineoplastic radiation therapy: Secondary | ICD-10-CM | POA: Insufficient documentation

## 2018-09-29 ENCOUNTER — Ambulatory Visit
Admission: RE | Admit: 2018-09-29 | Discharge: 2018-09-29 | Disposition: A | Discharge status: Home / Self Care | Payer: Medicare Other | Source: Ambulatory Visit | Attending: Radiation Oncology | Admitting: Radiation Oncology

## 2018-09-29 DIAGNOSIS — Z510 Encounter for antineoplastic radiation therapy: Secondary | ICD-10-CM | POA: Diagnosis not present

## 2018-10-02 ENCOUNTER — Ambulatory Visit
Admission: RE | Admit: 2018-10-02 | Discharge: 2018-10-02 | Disposition: A | Discharge status: Home / Self Care | Payer: Medicare Other | Source: Ambulatory Visit | Attending: Radiation Oncology | Admitting: Radiation Oncology

## 2018-10-02 ENCOUNTER — Inpatient Hospital Stay: Payer: Medicare Other | Attending: Oncology

## 2018-10-02 ENCOUNTER — Inpatient Hospital Stay: Payer: Medicare Other

## 2018-10-02 VITALS — BP 159/66 | HR 80 | Temp 97.8°F | Resp 18

## 2018-10-02 DIAGNOSIS — C679 Malignant neoplasm of bladder, unspecified: Secondary | ICD-10-CM

## 2018-10-02 DIAGNOSIS — Z510 Encounter for antineoplastic radiation therapy: Secondary | ICD-10-CM | POA: Diagnosis not present

## 2018-10-02 DIAGNOSIS — Z5111 Encounter for antineoplastic chemotherapy: Secondary | ICD-10-CM | POA: Insufficient documentation

## 2018-10-02 LAB — CMP (CANCER CENTER ONLY)
ALT: 16 U/L (ref 0–44)
AST: 13 U/L — ABNORMAL LOW (ref 15–41)
Albumin: 3.6 g/dL (ref 3.5–5.0)
Alkaline Phosphatase: 51 U/L (ref 38–126)
Anion gap: 9 (ref 5–15)
BUN: 12 mg/dL (ref 8–23)
CO2: 23 mmol/L (ref 22–32)
Calcium: 9.1 mg/dL (ref 8.9–10.3)
Chloride: 109 mmol/L (ref 98–111)
Creatinine: 1.2 mg/dL (ref 0.61–1.24)
GFR, Est AFR Am: >60 mL/min (ref >60)
GFR, Estimated: 57 mL/min — ABNORMAL LOW (ref >60)
Glucose, Bld: 103 mg/dL — ABNORMAL HIGH (ref 70–99)
Potassium: 4.1 mmol/L (ref 3.5–5.1)
Sodium: 141 mmol/L (ref 135–145)
Total Bilirubin: 0.6 mg/dL (ref 0.3–1.2)
Total Protein: 6.5 g/dL (ref 6.5–8.1)

## 2018-10-02 LAB — CBC WITH DIFFERENTIAL (CANCER CENTER ONLY)
Abs Immature Granulocytes: 0.04 10*3/uL (ref 0.00–0.07)
Basophils Absolute: 0 10*3/uL (ref 0.0–0.1)
Basophils Relative: 1 %
Eosinophils Absolute: 0.1 10*3/uL (ref 0.0–0.5)
Eosinophils Relative: 3 %
HCT: 40.1 % (ref 39.0–52.0)
Hemoglobin: 13.1 g/dL (ref 13.0–17.0)
Immature Granulocytes: 1 %
Lymphocytes Relative: 15 %
Lymphs Abs: 0.7 10*3/uL (ref 0.7–4.0)
MCH: 30.8 pg (ref 26.0–34.0)
MCHC: 32.7 g/dL (ref 30.0–36.0)
MCV: 94.4 fL (ref 80.0–100.0)
MONO ABS: 0.4 10*3/uL (ref 0.1–1.0)
MONOS PCT: 9 %
Neutro Abs: 3.4 10*3/uL (ref 1.7–7.7)
Neutrophils Relative %: 71 %
Platelet Count: 124 10*3/uL — ABNORMAL LOW (ref 150–400)
RBC: 4.25 MIL/uL (ref 4.22–5.81)
RDW: 14.2 % (ref 11.5–15.5)
WBC Count: 4.7 10*3/uL (ref 4.0–10.5)
nRBC: 0 % (ref 0.0–0.2)

## 2018-10-02 MED ORDER — SODIUM CHLORIDE 0.9 % IV SOLN
Freq: Once | INTRAVENOUS | Status: AC
  Administered 2018-10-02: 09:00:00 via INTRAVENOUS
  Filled 2018-10-02: qty 250

## 2018-10-02 MED ORDER — PALONOSETRON HCL INJECTION 0.25 MG/5ML
INTRAVENOUS | Status: AC
  Filled 2018-10-02: qty 5

## 2018-10-02 MED ORDER — SODIUM CHLORIDE 0.9 % IV SOLN
160.0000 mg | Freq: Once | INTRAVENOUS | Status: AC
  Administered 2018-10-02: 160 mg via INTRAVENOUS
  Filled 2018-10-02: qty 16

## 2018-10-02 MED ORDER — DEXAMETHASONE SODIUM PHOSPHATE 10 MG/ML IJ SOLN
INTRAMUSCULAR | Status: AC
  Filled 2018-10-02: qty 1

## 2018-10-02 MED ORDER — PALONOSETRON HCL INJECTION 0.25 MG/5ML
0.2500 mg | Freq: Once | INTRAVENOUS | Status: AC
  Administered 2018-10-02: 0.25 mg via INTRAVENOUS

## 2018-10-02 MED ORDER — DEXAMETHASONE SODIUM PHOSPHATE 10 MG/ML IJ SOLN
10.0000 mg | Freq: Once | INTRAMUSCULAR | Status: AC
  Administered 2018-10-02: 10 mg via INTRAVENOUS

## 2018-10-02 NOTE — Patient Instructions (Signed)
[facility] Discharge Instructions for Patients Receiving Chemotherapy  Today you received the following chemotherapy agents Carboplatin  To help prevent nausea and vomiting after your treatment, we encourage you to take your nausea medication as directed   If you develop nausea and vomiting that is not controlled by your nausea medication, call the clinic.   BELOW ARE SYMPTOMS THAT SHOULD BE REPORTED IMMEDIATELY:  *FEVER GREATER THAN 100.5 F  *CHILLS WITH OR WITHOUT FEVER  NAUSEA AND VOMITING THAT IS NOT CONTROLLED WITH YOUR NAUSEA MEDICATION  *UNUSUAL SHORTNESS OF BREATH  *UNUSUAL BRUISING OR BLEEDING  TENDERNESS IN MOUTH AND THROAT WITH OR WITHOUT PRESENCE OF ULCERS  *URINARY PROBLEMS  *BOWEL PROBLEMS  UNUSUAL RASH Items with * indicate a potential emergency and should be followed up as soon as possible.  Feel free to call the clinic should you have any questions or concerns. The clinic phone number is [phone].  Please show the CHEMO ALERT CARD at check-in to the Emergency Department and triage nurse.   

## 2018-10-03 ENCOUNTER — Telehealth: Payer: Self-pay

## 2018-10-03 ENCOUNTER — Ambulatory Visit
Admission: RE | Admit: 2018-10-03 | Discharge: 2018-10-03 | Disposition: A | Discharge status: Home / Self Care | Payer: Medicare Other | Source: Ambulatory Visit | Attending: Radiation Oncology | Admitting: Radiation Oncology

## 2018-10-03 DIAGNOSIS — Z510 Encounter for antineoplastic radiation therapy: Secondary | ICD-10-CM | POA: Diagnosis not present

## 2018-10-03 NOTE — Telephone Encounter (Signed)
Patient called to stated that he has had 2 episodes of numbness of the left hand and forearm lasting 15 minutes each. He will continue to monitor and will make [place] aware of any other symptoms or concerns. Dr. Alen Blew also aware.

## 2018-10-04 ENCOUNTER — Ambulatory Visit
Admission: RE | Admit: 2018-10-04 | Discharge: 2018-10-04 | Disposition: A | Discharge status: Home / Self Care | Payer: Medicare Other | Source: Ambulatory Visit | Attending: Radiation Oncology | Admitting: Radiation Oncology

## 2018-10-04 DIAGNOSIS — Z510 Encounter for antineoplastic radiation therapy: Secondary | ICD-10-CM | POA: Diagnosis not present

## 2018-10-05 ENCOUNTER — Ambulatory Visit
Admission: RE | Admit: 2018-10-05 | Discharge: 2018-10-05 | Disposition: A | Discharge status: Home / Self Care | Payer: Medicare Other | Source: Ambulatory Visit | Attending: Radiation Oncology | Admitting: Radiation Oncology

## 2018-10-05 DIAGNOSIS — Z510 Encounter for antineoplastic radiation therapy: Secondary | ICD-10-CM | POA: Diagnosis not present

## 2018-10-06 ENCOUNTER — Ambulatory Visit
Admission: RE | Admit: 2018-10-06 | Discharge: 2018-10-06 | Disposition: A | Discharge status: Home / Self Care | Payer: Medicare Other | Source: Ambulatory Visit | Attending: Radiation Oncology | Admitting: Radiation Oncology

## 2018-10-06 DIAGNOSIS — Z510 Encounter for antineoplastic radiation therapy: Secondary | ICD-10-CM | POA: Diagnosis not present

## 2018-10-09 ENCOUNTER — Inpatient Hospital Stay: Payer: Medicare Other

## 2018-10-09 ENCOUNTER — Ambulatory Visit
Admission: RE | Admit: 2018-10-09 | Discharge: 2018-10-09 | Disposition: A | Discharge status: Home / Self Care | Payer: Medicare Other | Source: Ambulatory Visit | Attending: Radiation Oncology | Admitting: Radiation Oncology

## 2018-10-09 VITALS — BP 120/78 | HR 87 | Temp 98.2°F | Resp 18 | Ht 71.0 in | Wt 183.5 lb

## 2018-10-09 DIAGNOSIS — C679 Malignant neoplasm of bladder, unspecified: Secondary | ICD-10-CM

## 2018-10-09 DIAGNOSIS — Z510 Encounter for antineoplastic radiation therapy: Secondary | ICD-10-CM | POA: Diagnosis not present

## 2018-10-09 DIAGNOSIS — Z5111 Encounter for antineoplastic chemotherapy: Secondary | ICD-10-CM | POA: Diagnosis not present

## 2018-10-09 LAB — CMP (CANCER CENTER ONLY)
ALT: 16 U/L (ref 0–44)
AST: 12 U/L — ABNORMAL LOW (ref 15–41)
Albumin: 3.7 g/dL (ref 3.5–5.0)
Alkaline Phosphatase: 51 U/L (ref 38–126)
Anion gap: 9 (ref 5–15)
BUN: 13 mg/dL (ref 8–23)
CO2: 25 mmol/L (ref 22–32)
Calcium: 9.3 mg/dL (ref 8.9–10.3)
Chloride: 108 mmol/L (ref 98–111)
Creatinine: 1.16 mg/dL (ref 0.61–1.24)
GFR, Estimated: 59 mL/min — ABNORMAL LOW (ref >60)
Glucose, Bld: 118 mg/dL — ABNORMAL HIGH (ref 70–99)
Potassium: 3.9 mmol/L (ref 3.5–5.1)
Sodium: 142 mmol/L (ref 135–145)
Total Bilirubin: 0.7 mg/dL (ref 0.3–1.2)
Total Protein: 6.8 g/dL (ref 6.5–8.1)

## 2018-10-09 LAB — CBC WITH DIFFERENTIAL (CANCER CENTER ONLY)
Abs Immature Granulocytes: 0.04 10*3/uL (ref 0.00–0.07)
BASOS ABS: 0 10*3/uL (ref 0.0–0.1)
Basophils Relative: 1 %
Eosinophils Absolute: 0.1 10*3/uL (ref 0.0–0.5)
Eosinophils Relative: 2 %
HCT: 39.4 % (ref 39.0–52.0)
Hemoglobin: 12.9 g/dL — ABNORMAL LOW (ref 13.0–17.0)
Immature Granulocytes: 1 %
Lymphocytes Relative: 12 %
Lymphs Abs: 0.5 10*3/uL — ABNORMAL LOW (ref 0.7–4.0)
MCH: 30.8 pg (ref 26.0–34.0)
MCHC: 32.7 g/dL (ref 30.0–36.0)
MCV: 94 fL (ref 80.0–100.0)
Monocytes Absolute: 0.3 10*3/uL (ref 0.1–1.0)
Monocytes Relative: 8 %
NEUTROS ABS: 3.3 10*3/uL (ref 1.7–7.7)
Neutrophils Relative %: 76 %
PLATELETS: 126 10*3/uL — AB (ref 150–400)
RBC: 4.19 MIL/uL — ABNORMAL LOW (ref 4.22–5.81)
RDW: 14.3 % (ref 11.5–15.5)
WBC Count: 4.3 10*3/uL (ref 4.0–10.5)
nRBC: 0 % (ref 0.0–0.2)

## 2018-10-09 MED ORDER — SODIUM CHLORIDE 0.9 % IV SOLN
160.0000 mg | Freq: Once | INTRAVENOUS | Status: AC
  Administered 2018-10-09: 160 mg via INTRAVENOUS
  Filled 2018-10-09: qty 16

## 2018-10-09 MED ORDER — SODIUM CHLORIDE 0.9 % IV SOLN
Freq: Once | INTRAVENOUS | Status: AC
  Administered 2018-10-09: 10:00:00 via INTRAVENOUS
  Filled 2018-10-09: qty 250

## 2018-10-09 MED ORDER — PALONOSETRON HCL INJECTION 0.25 MG/5ML
0.2500 mg | Freq: Once | INTRAVENOUS | Status: AC
  Administered 2018-10-09: 0.25 mg via INTRAVENOUS

## 2018-10-09 MED ORDER — PALONOSETRON HCL INJECTION 0.25 MG/5ML
INTRAVENOUS | Status: AC
  Filled 2018-10-09: qty 5

## 2018-10-09 MED ORDER — DEXAMETHASONE SODIUM PHOSPHATE 10 MG/ML IJ SOLN
INTRAMUSCULAR | Status: AC
  Filled 2018-10-09: qty 1

## 2018-10-09 MED ORDER — DEXAMETHASONE SODIUM PHOSPHATE 10 MG/ML IJ SOLN
10.0000 mg | Freq: Once | INTRAMUSCULAR | Status: AC
  Administered 2018-10-09: 10 mg via INTRAVENOUS

## 2018-10-09 NOTE — Patient Instructions (Signed)
[facility] Discharge Inst[facility] for Patients Receiving Chemotherapy  Today you received the following chemotherapy agents Carboplatin  To help prevent nausea and vomiting after your treatment, we encourage you to take your nausea medication as directed   If you develop nausea and vomiting that is not controlled by your nausea medication, call the clinic.   BELOW ARE SYMPTOMS THAT SHOULD BE REPORTED IMMEDIATELY:  *FEVER GREATER THAN 100.5 F  *CHILLS WITH OR WITHOUT FEVER  NAUSEA AND VOMITING THAT IS NOT CONTROLLED WITH YOUR NAUSEA MEDICATION  *UNUSUAL SHORTNESS OF BREATH  *UNUSUAL BRUISING OR BLEEDING  TENDERNESS IN MOUTH AND THROAT WITH OR WITHOUT PRESENCE OF ULCERS  *URINARY PROBLEMS  *BOWEL PROBLEMS  UNUSUAL RASH Items with * indicate a potential emergency and should be followed up as soon as possible.  Feel free to call the clinic should you have any questions or concerns. The clinic phone number is [phone].  Please show the CHEMO ALERT CARD at check-in to the Emergency Department and triage nurse.   

## 2018-10-10 ENCOUNTER — Ambulatory Visit
Admission: RE | Admit: 2018-10-10 | Discharge: 2018-10-10 | Disposition: A | Discharge status: Home / Self Care | Payer: Medicare Other | Source: Ambulatory Visit | Attending: Radiation Oncology | Admitting: Radiation Oncology

## 2018-10-10 DIAGNOSIS — Z510 Encounter for antineoplastic radiation therapy: Secondary | ICD-10-CM | POA: Diagnosis not present

## 2018-10-11 ENCOUNTER — Ambulatory Visit
Admission: RE | Admit: 2018-10-11 | Discharge: 2018-10-11 | Disposition: A | Discharge status: Home / Self Care | Payer: Medicare Other | Source: Ambulatory Visit | Attending: Radiation Oncology | Admitting: Radiation Oncology

## 2018-10-11 DIAGNOSIS — Z510 Encounter for antineoplastic radiation therapy: Secondary | ICD-10-CM | POA: Diagnosis not present

## 2018-10-12 ENCOUNTER — Ambulatory Visit
Admission: RE | Admit: 2018-10-12 | Discharge: 2018-10-12 | Disposition: A | Discharge status: Home / Self Care | Payer: Medicare Other | Source: Ambulatory Visit | Attending: Radiation Oncology | Admitting: Radiation Oncology

## 2018-10-12 DIAGNOSIS — Z510 Encounter for antineoplastic radiation therapy: Secondary | ICD-10-CM | POA: Diagnosis not present

## 2018-10-13 ENCOUNTER — Ambulatory Visit
Admission: RE | Admit: 2018-10-13 | Discharge: 2018-10-13 | Disposition: A | Discharge status: Home / Self Care | Payer: Medicare Other | Source: Ambulatory Visit | Attending: Radiation Oncology | Admitting: Radiation Oncology

## 2018-10-13 DIAGNOSIS — Z510 Encounter for antineoplastic radiation therapy: Secondary | ICD-10-CM | POA: Diagnosis not present

## 2018-10-16 ENCOUNTER — Inpatient Hospital Stay (HOSPITAL_BASED_OUTPATIENT_CLINIC_OR_DEPARTMENT_OTHER): Payer: Medicare Other | Admitting: Oncology

## 2018-10-16 ENCOUNTER — Inpatient Hospital Stay: Payer: Medicare Other

## 2018-10-16 ENCOUNTER — Ambulatory Visit
Admission: RE | Admit: 2018-10-16 | Discharge: 2018-10-16 | Disposition: A | Discharge status: Home / Self Care | Payer: Medicare Other | Source: Ambulatory Visit | Attending: Radiation Oncology | Admitting: Radiation Oncology

## 2018-10-16 ENCOUNTER — Telehealth: Payer: Self-pay | Admitting: Oncology

## 2018-10-16 VITALS — BP 146/75 | HR 84 | Temp 97.9°F | Resp 18 | Ht 71.0 in | Wt 184.2 lb

## 2018-10-16 DIAGNOSIS — Z5111 Encounter for antineoplastic chemotherapy: Secondary | ICD-10-CM | POA: Diagnosis not present

## 2018-10-16 DIAGNOSIS — C679 Malignant neoplasm of bladder, unspecified: Secondary | ICD-10-CM | POA: Diagnosis not present

## 2018-10-16 DIAGNOSIS — Z510 Encounter for antineoplastic radiation therapy: Secondary | ICD-10-CM | POA: Diagnosis not present

## 2018-10-16 DIAGNOSIS — Z7189 Other specified counseling: Secondary | ICD-10-CM

## 2018-10-16 LAB — CBC WITH DIFFERENTIAL (CANCER CENTER ONLY)
Abs Immature Granulocytes: 0.03 10*3/uL (ref 0.00–0.07)
BASOS PCT: 1 %
Basophils Absolute: 0 10*3/uL (ref 0.0–0.1)
Eosinophils Absolute: 0.1 10*3/uL (ref 0.0–0.5)
Eosinophils Relative: 2 %
HCT: 37.9 % — ABNORMAL LOW (ref 39.0–52.0)
Hemoglobin: 12.5 g/dL — ABNORMAL LOW (ref 13.0–17.0)
Immature Granulocytes: 1 %
Lymphocytes Relative: 14 %
Lymphs Abs: 0.5 10*3/uL — ABNORMAL LOW (ref 0.7–4.0)
MCH: 31.4 pg (ref 26.0–34.0)
MCHC: 33 g/dL (ref 30.0–36.0)
MCV: 95.2 fL (ref 80.0–100.0)
MONOS PCT: 12 %
Monocytes Absolute: 0.4 10*3/uL (ref 0.1–1.0)
Neutro Abs: 2.5 10*3/uL (ref 1.7–7.7)
Neutrophils Relative %: 70 %
Platelet Count: 126 10*3/uL — ABNORMAL LOW (ref 150–400)
RBC: 3.98 MIL/uL — ABNORMAL LOW (ref 4.22–5.81)
RDW: 15 % (ref 11.5–15.5)
WBC Count: 3.5 10*3/uL — ABNORMAL LOW (ref 4.0–10.5)
nRBC: 0 % (ref 0.0–0.2)

## 2018-10-16 LAB — CMP (CANCER CENTER ONLY)
ALT: 14 U/L (ref 0–44)
AST: 11 U/L — ABNORMAL LOW (ref 15–41)
Albumin: 3.7 g/dL (ref 3.5–5.0)
Alkaline Phosphatase: 52 U/L (ref 38–126)
Anion gap: 8 (ref 5–15)
BILIRUBIN TOTAL: 0.6 mg/dL (ref 0.3–1.2)
BUN: 11 mg/dL (ref 8–23)
CALCIUM: 9.1 mg/dL (ref 8.9–10.3)
CO2: 25 mmol/L (ref 22–32)
Chloride: 110 mmol/L (ref 98–111)
Creatinine: 1.14 mg/dL (ref 0.61–1.24)
GFR, Est AFR Am: >60 mL/min (ref >60)
GFR, Estimated: >60 mL/min (ref >60)
Glucose, Bld: 91 mg/dL (ref 70–99)
Potassium: 4.4 mmol/L (ref 3.5–5.1)
Sodium: 143 mmol/L (ref 135–145)
TOTAL PROTEIN: 6.8 g/dL (ref 6.5–8.1)

## 2018-10-16 MED ORDER — SODIUM CHLORIDE 0.9 % IV SOLN
160.0000 mg | Freq: Once | INTRAVENOUS | Status: AC
  Administered 2018-10-16: 160 mg via INTRAVENOUS
  Filled 2018-10-16: qty 16

## 2018-10-16 MED ORDER — PALONOSETRON HCL INJECTION 0.25 MG/5ML
0.2500 mg | Freq: Once | INTRAVENOUS | Status: AC
  Administered 2018-10-16: 0.25 mg via INTRAVENOUS

## 2018-10-16 MED ORDER — SODIUM CHLORIDE 0.9 % IV SOLN
Freq: Once | INTRAVENOUS | Status: AC
  Administered 2018-10-16: 11:00:00 via INTRAVENOUS
  Filled 2018-10-16: qty 250

## 2018-10-16 MED ORDER — DEXAMETHASONE SODIUM PHOSPHATE 10 MG/ML IJ SOLN
10.0000 mg | Freq: Once | INTRAMUSCULAR | Status: AC
  Administered 2018-10-16: 10 mg via INTRAVENOUS

## 2018-10-16 MED ORDER — PALONOSETRON HCL INJECTION 0.25 MG/5ML
INTRAVENOUS | Status: AC
  Filled 2018-10-16: qty 5

## 2018-10-16 MED ORDER — DEXAMETHASONE SODIUM PHOSPHATE 10 MG/ML IJ SOLN
INTRAMUSCULAR | Status: AC
  Filled 2018-10-16: qty 1

## 2018-10-16 NOTE — Telephone Encounter (Signed)
Printed calendar and avs. °

## 2018-10-16 NOTE — Progress Notes (Signed)
Hematology and Oncology Follow Up Visit  Anthony Pacheco [id number] [birth year] 83 y.o. 10/16/2018 10:52 AM [name], [name], MDMitchell, [name], MD   Principle Diagnosis: 83 year old with T2N0 urothelial carcinoma of the bladder diagnosed in 2019.    Prior Therapy:  TURBT on July 18, 2018.  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  Anthony Pacheco was found to have T2 lesion with grade 3 histology.    Current therapy: Radiation therapy with weekly carboplatin AUC of 2 started in December 2019. Anthony Pacheco is here for cycle 6 of carboplatin.  Interim History: Anthony Pacheco returns today for a repeat evaluation.  Since the last visit, Anthony Pacheco continues to tolerate therapy without any issues.  Anthony Pacheco denies any nausea, fatigue or infusion related complications.  Anthony Pacheco denies any worsening neuropathy.  His performance status and activity level remain excellent.  His IV access has been an issue at times but able to continue therapy.  Appetite and performance status remains unchanged.  Anthony Pacheco does not report any headaches, blurry vision, syncope or seizures.  Denies any changes in his mentation or lethargy..  Does not report any fevers, chills or sweats.  Does not report any cough, wheezing or hemoptysis.  Does not report any chest pain, palpitation, orthopnea or leg edema.  Does not report any nausea, vomiting or early satiety. Does not report any changes in bowel habits. Does not report any paralysis or myalgias.   Does not report frequency, urgency or hematuria.  Does not report any skin rashes or lesions.  Does not report any easy bruising.  Does not report any changes in his mood.  Remaining review of systems is negative.    Medications: I have reviewed the patient's current medications.  Current Outpatient Medications  Medication Sig Dispense Refill  . levothyroxine (SYNTHROID, LEVOTHROID) 100 MCG tablet Take 100 mcg by mouth daily.  3  .  prochlorperazine (COMPAZINE) 10 MG tablet Take 1 tablet (10 mg total) by mouth every 6 (six) hours as needed for nausea or vomiting. 30 tablet 0   No current facility-administered medications for this visit.      Allergies: No Known Allergies  Past Medical History, Surgical history, Social history, and Family History were reviewed and updated.    Physical Exam: Blood pressure (!) 146/75, pulse 84, temperature 97.9 F (36.6 C), temperature source Oral, resp. rate 18, height 5\' 11"  (1.803 m), weight 184 lb 3.2 oz (83.6 kg), SpO2 100 %.    ECOG: 1    General appearance: Alert, awake without any distress. Head: Atraumatic without abnormalities Oropharynx: Without any thrush or ulcers. Eyes: No scleral icterus. Lymph nodes: No lymphadenopathy noted in the cervical, supraclavicular, or axillary nodes Heart:regular rate and rhythm, without any murmurs or gallops.   Lung: Clear to auscultation without any rhonchi, wheezes or dullness to percussion. Abdomin: Soft, nontender without any shifting dullness or ascites. Musculoskeletal: No clubbing or cyanosis. Neurological: No motor or sensory deficits. Skin: No rashes or lesions.          Lab Results: Lab Results  Component Value Date   WBC 3.5 (L) 10/16/2018   HGB 12.5 (L) 10/16/2018   HCT 37.9 (L) 10/16/2018   MCV 95.2 10/16/2018   PLT 126 (L) 10/16/2018     Chemistry      Component Value Date/Time   NA 143 10/16/2018 0824   K 4.4 10/16/2018 0824   CL 110 10/16/2018 0824   CO2 25 10/16/2018 3818  BUN 11 10/16/2018 0824   CREATININE 1.14 10/16/2018 0824      Component Value Date/Time   CALCIUM 9.1 10/16/2018 0824   ALKPHOS 52 10/16/2018 0824   AST 11 (L) 10/16/2018 0824   ALT 14 10/16/2018 0824   BILITOT 0.6 10/16/2018 0824         Impression and Plan:  83 year old man with:  1.    Bladder cancer diagnosed in October 2019.  His tumor showed high-grade urothelial carcinoma.    Anthony Pacheco continues to  receive definitive therapy with radiation and weekly carboplatin.  Risks and benefits of continuing this therapy was discussed today.  Long-term complications were reiterated.  These would include nausea, vomiting myelosuppression and renal dysfunction.  After discussion today Anthony Pacheco will receive the last cycle of treatment today and will complete staging work-up with a CT scan in February.  Anthony Pacheco will also require repeat cystoscopy in the future.      2.  IV access: Peripheral veins will continue to be in use at this time.  IV access has been poor but manageable.  3.  Antiemetics: No issues with nausea and vomiting reported at this time.  Compazine is available to him.  4.  Kidney function surveillance: His kidney function remains stable without any changes.  We will continue to monitor after treatment is completed.  5.  Goals of care and prognosis: Aggressive therapy is warranted given his excellent performance status and curable malignancy.  6.  Follow-up: Anthony Pacheco will be in 4 to 6 weeks for repeat imaging studies and follow-up.  25  minutes was spent with the patient face-to-face today.  More than 50% of time was dedicated to reviewing the natural course of his disease, treatment options, complications related therapy and answering questions regarding future plan of care    Zola Button, MD 1/20/202010:52 AM

## 2018-10-16 NOTE — Patient Instructions (Signed)
[facility] Discharge Instructions for Patients Receiving Chemotherapy  Today you received the following chemotherapy agents Carboplatin  To help prevent nausea and vomiting after your treatment, we encourage you to take your nausea medication as directed   If you develop nausea and vomiting that is not controlled by your nausea medication, call the clinic.   BELOW ARE SYMPTOMS THAT SHOULD BE REPORTED IMMEDIATELY:  *FEVER GREATER THAN 100.5 F  *CHILLS WITH OR WITHOUT FEVER  NAUSEA AND VOMITING THAT IS NOT CONTROLLED WITH YOUR NAUSEA MEDICATION  *UNUSUAL SHORTNESS OF BREATH  *UNUSUAL BRUISING OR BLEEDING  TENDERNESS IN MOUTH AND THROAT WITH OR WITHOUT PRESENCE OF ULCERS  *URINARY PROBLEMS  *BOWEL PROBLEMS  UNUSUAL RASH Items with * indicate a potential emergency and should be followed up as soon as possible.  Feel free to call the clinic should you have any questions or concerns. The clinic phone number is [phone].  Please show the CHEMO ALERT CARD at check-in to the Emergency Department and triage nurse.   

## 2018-10-17 ENCOUNTER — Ambulatory Visit
Admission: RE | Admit: 2018-10-17 | Discharge: 2018-10-17 | Disposition: A | Discharge status: Home / Self Care | Payer: Medicare Other | Source: Ambulatory Visit | Attending: Radiation Oncology | Admitting: Radiation Oncology

## 2018-10-17 DIAGNOSIS — Z510 Encounter for antineoplastic radiation therapy: Secondary | ICD-10-CM | POA: Diagnosis not present

## 2018-10-18 ENCOUNTER — Ambulatory Visit
Admission: RE | Admit: 2018-10-18 | Discharge: 2018-10-18 | Disposition: A | Discharge status: Home / Self Care | Payer: Medicare Other | Source: Ambulatory Visit | Attending: Radiation Oncology | Admitting: Radiation Oncology

## 2018-10-18 DIAGNOSIS — Z510 Encounter for antineoplastic radiation therapy: Secondary | ICD-10-CM | POA: Diagnosis not present

## 2018-10-19 ENCOUNTER — Ambulatory Visit
Admission: RE | Admit: 2018-10-19 | Discharge: 2018-10-19 | Disposition: A | Discharge status: Home / Self Care | Payer: Medicare Other | Source: Ambulatory Visit | Attending: Radiation Oncology | Admitting: Radiation Oncology

## 2018-10-19 DIAGNOSIS — Z510 Encounter for antineoplastic radiation therapy: Secondary | ICD-10-CM | POA: Diagnosis not present

## 2018-10-20 ENCOUNTER — Ambulatory Visit
Admission: RE | Admit: 2018-10-20 | Discharge: 2018-10-20 | Disposition: A | Discharge status: Home / Self Care | Payer: Medicare Other | Source: Ambulatory Visit | Attending: Radiation Oncology | Admitting: Radiation Oncology

## 2018-10-20 DIAGNOSIS — Z510 Encounter for antineoplastic radiation therapy: Secondary | ICD-10-CM | POA: Diagnosis not present

## 2018-10-23 ENCOUNTER — Ambulatory Visit
Admission: RE | Admit: 2018-10-23 | Discharge: 2018-10-23 | Disposition: A | Discharge status: Home / Self Care | Payer: Medicare Other | Source: Ambulatory Visit | Attending: Radiation Oncology | Admitting: Radiation Oncology

## 2018-10-23 DIAGNOSIS — Z510 Encounter for antineoplastic radiation therapy: Secondary | ICD-10-CM | POA: Diagnosis not present

## 2018-10-24 ENCOUNTER — Ambulatory Visit
Admission: RE | Admit: 2018-10-24 | Discharge: 2018-10-24 | Disposition: A | Discharge status: Home / Self Care | Payer: Medicare Other | Source: Ambulatory Visit | Attending: Radiation Oncology | Admitting: Radiation Oncology

## 2018-10-24 DIAGNOSIS — Z510 Encounter for antineoplastic radiation therapy: Secondary | ICD-10-CM | POA: Diagnosis not present

## 2018-10-25 ENCOUNTER — Ambulatory Visit
Admission: RE | Admit: 2018-10-25 | Discharge: 2018-10-25 | Disposition: A | Discharge status: Home / Self Care | Payer: Medicare Other | Source: Ambulatory Visit | Attending: Radiation Oncology | Admitting: Radiation Oncology

## 2018-10-25 DIAGNOSIS — Z510 Encounter for antineoplastic radiation therapy: Secondary | ICD-10-CM | POA: Diagnosis not present

## 2018-11-01 ENCOUNTER — Encounter: Payer: Self-pay | Admitting: Radiation Oncology

## 2018-11-01 NOTE — Progress Notes (Signed)
  Radiation Oncology         [phone] ________________________________  Name: Anthony Pacheco MRN: [id number]  Date: 11/01/2018  DOB: [birth year]  End of Treatment Note  Diagnosis:   Malignant neoplasm of anterior wall of urinary bladder ([place]). 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 He was found to have T2 lesion with grade 3 histology     Indication for treatment:  Curative       Radiation treatment dates:   09/04/18-10/25/18  Site/dose:   1. Pelvis/Bladder; 45 Gy in 25 fractions of 1.8 Gy           2. Bladder Boost; 19.8 Gy in 11 fractions of 1.8 Gy (64.8 Gy)  Beams/energy:   1. IMRT Photon; 6X          2. IMRT Photon; 6X  Narrative: The patient tolerated radiation treatment relatively well.     At the beginning of treatment, pt reported nocturia x2 and emptying his bladder with urination. Pt denied pain, fatigue, hematuria and dysuria throughout treatments. Towards the end of treatment, pt reported frequency, urgency, and nocturia x3.   Plan: The patient has completed radiation treatment. The patient will return to radiation oncology clinic for routine followup in one month. I advised them to call or return sooner if they have any questions or concerns related to their recovery or treatment.  -----------------------------------  Blair Promise, PhD, MD  This document serves as a record of services personally performed by Gery Pray, MD. It was created on his behalf by Mary-Margaret Loma Messing, a trained medical scribe. The creation of this record is based on the scribe's personal observations and the provider's statements to them. This document has been checked and approved by the attending provider.

## 2018-11-21 ENCOUNTER — Ambulatory Visit (HOSPITAL_COMMUNITY)
Admission: RE | Admit: 2018-11-21 | Discharge: 2018-11-21 | Disposition: A | Discharge status: Home / Self Care | Payer: Medicare Other | Source: Ambulatory Visit | Attending: Oncology | Admitting: Oncology

## 2018-11-21 ENCOUNTER — Inpatient Hospital Stay: Payer: Medicare Other | Attending: Oncology

## 2018-11-21 DIAGNOSIS — N289 Disorder of kidney and ureter, unspecified: Secondary | ICD-10-CM | POA: Insufficient documentation

## 2018-11-21 DIAGNOSIS — C679 Malignant neoplasm of bladder, unspecified: Secondary | ICD-10-CM | POA: Diagnosis not present

## 2018-11-21 LAB — CBC WITH DIFFERENTIAL (CANCER CENTER ONLY)
Abs Immature Granulocytes: 0.08 10*3/uL — ABNORMAL HIGH (ref 0.00–0.07)
Basophils Absolute: 0 10*3/uL (ref 0.0–0.1)
Basophils Relative: 1 %
Eosinophils Absolute: 0.1 10*3/uL (ref 0.0–0.5)
Eosinophils Relative: 2 %
HCT: 39.3 % (ref 39.0–52.0)
Hemoglobin: 13.1 g/dL (ref 13.0–17.0)
IMMATURE GRANULOCYTES: 2 %
LYMPHS ABS: 0.9 10*3/uL (ref 0.7–4.0)
Lymphocytes Relative: 18 %
MCH: 32.3 pg (ref 26.0–34.0)
MCHC: 33.3 g/dL (ref 30.0–36.0)
MCV: 96.8 fL (ref 80.0–100.0)
Monocytes Absolute: 0.6 10*3/uL (ref 0.1–1.0)
Monocytes Relative: 12 %
Neutro Abs: 3.4 10*3/uL (ref 1.7–7.7)
Neutrophils Relative %: 65 %
Platelet Count: 172 10*3/uL (ref 150–400)
RBC: 4.06 MIL/uL — ABNORMAL LOW (ref 4.22–5.81)
RDW: 15 % (ref 11.5–15.5)
WBC Count: 5.1 10*3/uL (ref 4.0–10.5)
nRBC: 0 % (ref 0.0–0.2)

## 2018-11-21 LAB — CMP (CANCER CENTER ONLY)
ALBUMIN: 4 g/dL (ref 3.5–5.0)
ALT: 11 U/L (ref 0–44)
AST: 16 U/L (ref 15–41)
Alkaline Phosphatase: 61 U/L (ref 38–126)
Anion gap: 8 (ref 5–15)
BUN: 14 mg/dL (ref 8–23)
CHLORIDE: 106 mmol/L (ref 98–111)
CO2: 28 mmol/L (ref 22–32)
Calcium: 9.5 mg/dL (ref 8.9–10.3)
Creatinine: 1.33 mg/dL — ABNORMAL HIGH (ref 0.61–1.24)
GFR, Est AFR Am: 58 mL/min — ABNORMAL LOW (ref >60)
GFR, Estimated: 50 mL/min — ABNORMAL LOW (ref >60)
Glucose, Bld: 100 mg/dL — ABNORMAL HIGH (ref 70–99)
Potassium: 5.4 mmol/L — ABNORMAL HIGH (ref 3.5–5.1)
Sodium: 142 mmol/L (ref 135–145)
Total Bilirubin: 0.6 mg/dL (ref 0.3–1.2)
Total Protein: 7.2 g/dL (ref 6.5–8.1)

## 2018-11-21 MED ORDER — SODIUM CHLORIDE (PF) 0.9 % IJ SOLN
INTRAMUSCULAR | Status: AC
  Filled 2018-11-21: qty 50

## 2018-11-21 MED ORDER — IOHEXOL 300 MG/ML  SOLN
100.0000 mL | Freq: Once | INTRAMUSCULAR | Status: AC | PRN
  Administered 2018-11-21: 100 mL via INTRAVENOUS

## 2018-11-21 MED ORDER — SODIUM CHLORIDE 0.9 % IV SOLN
INTRAVENOUS | Status: AC
  Filled 2018-11-21: qty 250

## 2018-11-23 ENCOUNTER — Inpatient Hospital Stay: Payer: Medicare Other | Admitting: Oncology

## 2018-11-23 ENCOUNTER — Telehealth: Payer: Self-pay | Admitting: Oncology

## 2018-11-23 VITALS — BP 142/74 | HR 90 | Temp 98.0°F | Resp 18 | Ht 71.0 in | Wt 186.1 lb

## 2018-11-23 DIAGNOSIS — N289 Disorder of kidney and ureter, unspecified: Secondary | ICD-10-CM | POA: Diagnosis not present

## 2018-11-23 DIAGNOSIS — Z7189 Other specified counseling: Secondary | ICD-10-CM

## 2018-11-23 DIAGNOSIS — C679 Malignant neoplasm of bladder, unspecified: Secondary | ICD-10-CM | POA: Diagnosis not present

## 2018-11-23 NOTE — Progress Notes (Signed)
Hematology and Oncology Follow Up Visit  Anthony Pacheco [id number] 09-May-1936 83 y.o. 11/23/2018 10:37 AM [name], [name], MDMitchell, [name], MD   Principle Diagnosis: 83 year old with bladder cancer diagnosed in 2019 he presented with T2N0 urothelial carcinoma without any evidence of metastatic disease.  Prior Therapy:  TURBT on July 18, 2018.  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  He was found to have T2 lesion with grade 3 histology.   Radiation therapy with weekly carboplatin completed and January 2020.  Current therapy: Active surveillance.  Interim History: [name] is here for a follow-up visit.  Since last visit, he completed therapy for his treatment for bladder cancer including radiation and chemotherapy.  He reports feeling reasonably well without any major complaints.  He denies any abdominal pain or dysuria.  He denies any diarrhea or excessive fatigue.  His performance status quality of life is maintained.  Denies recent hospitalization or illnesses.  He denies any recurrent infections.  Patient denied any alteration mental status, neuropathy, confusion or dizziness.  Denies any headaches or lethargy.  Denies any night sweats, weight loss or changes in appetite.  Denied orthopnea, dyspnea on exertion or chest discomfort.  Denies shortness of breath, difficulty breathing hemoptysis or cough.  Denies any abdominal distention, nausea, early satiety or dyspepsia.  Denies any hematuria, frequency, dysuria or nocturia.  Denies any skin irritation, dryness or rash.  Denies any ecchymosis or petechiae.  Denies any lymphadenopathy or clotting.  Denies any heat or cold intolerance.  Denies any anxiety or depression.  Remaining review of system is negative.     Medications: I have reviewed the patient's current medications.  Current Outpatient Medications  Medication Sig Dispense Refill   . levothyroxine (SYNTHROID, LEVOTHROID) 100 MCG tablet Take 100 mcg by mouth daily.  3  . prochlorperazine (COMPAZINE) 10 MG tablet Take 1 tablet (10 mg total) by mouth every 6 (six) hours as needed for nausea or vomiting. 30 tablet 0   No current facility-administered medications for this visit.      Allergies: No Known Allergies  Past Medical History, Surgical history, Social history, and Family History were reviewed and updated.    Physical Exam: Blood pressure (!) 142/74, pulse 90, temperature 98 F (36.7 C), temperature source Oral, resp. rate 18, height 5\' 11"  (1.803 m), weight 186 lb 1.6 oz (84.4 kg), SpO2 100 %.    ECOG: 1    General appearance: Comfortable appearing without any discomfort Head: Normocephalic without any trauma Oropharynx: Mucous membranes are moist and pink without any thrush or ulcers. Eyes: Pupils are equal and round reactive to light. Lymph nodes: No cervical, supraclavicular, inguinal or axillary lymphadenopathy.   Heart:regular rate and rhythm.  S1 and S2 without leg edema. Lung: Clear without any rhonchi or wheezes.  No dullness to percussion. Abdomin: Soft, nontender, nondistended with good bowel sounds.  No hepatosplenomegaly. Musculoskeletal: No joint deformity or effusion.  Full range of motion noted. Neurological: No deficits noted on motor, sensory and deep tendon reflex exam. Skin: No petechial rash or dryness.  Appeared moist.          Lab Results: Lab Results  Component Value Date   WBC 5.1 11/21/2018   HGB 13.1 11/21/2018   HCT 39.3 11/21/2018   MCV 96.8 11/21/2018   PLT 172 11/21/2018     Chemistry      Component Value Date/Time   NA 142 11/21/2018 0917   K 5.4 (  H) 11/21/2018 0917   CL 106 11/21/2018 0917   CO2 28 11/21/2018 0917   BUN 14 11/21/2018 0917   CREATININE 1.33 (H) 11/21/2018 0917      Component Value Date/Time   CALCIUM 9.5 11/21/2018 0917   ALKPHOS 61 11/21/2018 0917   AST 16 11/21/2018 0917    ALT 11 11/21/2018 0917   BILITOT 0.6 11/21/2018 0917      EXAM: CT CHEST, ABDOMEN, AND PELVIS WITH CONTRAST  TECHNIQUE: Multidetector CT imaging of the chest, abdomen and pelvis was performed following the standard protocol during bolus administration of intravenous contrast.  CONTRAST:  18mL OMNIPAQUE IOHEXOL 300 MG/ML  SOLN  COMPARISON:  08/07/2018  FINDINGS: CT CHEST FINDINGS  Cardiovascular: The heart size is normal. No substantial pericardial effusion. Atherosclerotic calcification is noted in the wall of the thoracic aorta.  Mediastinum/Nodes: No mediastinal lymphadenopathy. There is no hilar lymphadenopathy. The esophagus has normal imaging features. There is no axillary lymphadenopathy.  Lungs/Pleura: The central tracheobronchial airways are patent. Stable appearance biapical pleuroparenchymal scarring. Calcified and noncalcified pleural plaques identified anterior right hemithorax. No suspicious pulmonary nodule or mass. No focal airspace consolidation. No pleural effusion. Calcified granuloma noted posterior left upper lobe.  Musculoskeletal: No worrisome lytic or sclerotic osseous abnormality.  CT ABDOMEN PELVIS FINDINGS  Hepatobiliary: No suspicious focal abnormality within the liver parenchyma. Gallstones again noted. No intrahepatic or extrahepatic biliary dilation.  Pancreas: No focal mass lesion. No dilatation of the main duct. No intraparenchymal cyst. No peripancreatic edema.  Spleen: No splenomegaly. No focal mass lesion.  Adrenals/Urinary Tract: No adrenal nodule or mass. Kidneys unremarkable. No evidence for hydroureter.  Delayed imaging shows no wall thickening or soft tissue filling defect in either intrarenal collecting system or renal pelvis. Left ureter well opacified and unremarkable. Distal right ureter unopacified, but no focal dilatation or mass lesion noted along the length of the right ureter. The anterior bladder  wall thickening, right slightly more than left, is similar to prior. The urinary bladder appears normal for the degree of distention.  Stomach/Bowel: Stomach is unremarkable. No gastric wall thickening. No evidence of outlet obstruction. Duodenum is normally positioned as is the ligament of Treitz. No small bowel wall thickening. No small bowel dilatation. The terminal ileum is normal. The appendix is normal. No gross colonic mass. No colonic wall thickening. Diverticular changes are noted in the left colon without evidence of diverticulitis.  Vascular/Lymphatic: There is abdominal aortic atherosclerosis without aneurysm. There is no gastrohepatic or hepatoduodenal ligament lymphadenopathy. No intraperitoneal or retroperitoneal lymphadenopathy. No pelvic sidewall lymphadenopathy.  Reproductive: TURP defect noted central prostate gland.  Other: No intraperitoneal free fluid.  Musculoskeletal: Small bilateral groin hernias contain only fat. No worrisome lytic or sclerotic osseous abnormality.  IMPRESSION: 1. Stable exam. Mild anterior bladder wall thickening, right greater than left, is similar to prior. 2. No new or progressive findings. Specifically, no evidence for metastatic disease in the chest, abdomen, or pelvis. 3. Cholelithiasis. 4. Small bilateral groin hernias contain only fat. 5.  Aortic Atherosclerois (ICD10-170.0)    Impression and Plan:  83 year old man with:  1.    T2N0 bladder cancer diagnosed in October 2019.    He completed the definitive therapy with radiation and weekly chemotherapy in January 2020.  CT scan obtained on November 21, 2018 was personally reviewed and discussed with the patient.  He has no evidence of advanced disease or disease outside of the bladder.  The natural course of this disease and risk of recurrence was assessed  today.  He will continue on active surveillance and will have a repeat cystoscopy in the near future.  Repeat  imaging studies will be done in 6 months.  He is agreeable to proceed at this time    2.    Renal insufficiency: His creatinine bumped slightly we will continue to monitor moving forward.  3.  Goals of care and prognosis: Therapy remains curative at this time.  4.  Follow-up: He will be in 6 months for repeat evaluation.  15  minutes was spent with the patient face-to-face today.  More than 50% of time was dedicated to discussing his disease status, reviewing imaging studies and answering question regarding future plan of surveillance.    Zola Button, MD 2/27/202010:37 AM

## 2018-11-23 NOTE — Telephone Encounter (Signed)
Scheduled appt per 02/27 los.  Printed calendar and avs.  Gave patient contrast, instructions and the number to central radiology. \

## 2018-11-30 ENCOUNTER — Other Ambulatory Visit: Payer: Self-pay

## 2018-11-30 ENCOUNTER — Encounter: Payer: Self-pay | Admitting: Radiation Oncology

## 2018-11-30 ENCOUNTER — Ambulatory Visit
Admission: RE | Admit: 2018-11-30 | Discharge: 2018-11-30 | Disposition: A | Discharge status: Home / Self Care | Payer: Medicare Other | Source: Ambulatory Visit | Attending: Radiation Oncology | Admitting: Radiation Oncology

## 2018-11-30 VITALS — BP 151/80 | HR 89 | Temp 97.8°F | Resp 18 | Wt 185.2 lb

## 2018-11-30 DIAGNOSIS — I700 Atherosclerosis of aorta: Secondary | ICD-10-CM | POA: Insufficient documentation

## 2018-11-30 DIAGNOSIS — K8020 Calculus of gallbladder without cholecystitis without obstruction: Secondary | ICD-10-CM | POA: Diagnosis not present

## 2018-11-30 DIAGNOSIS — R5383 Other fatigue: Secondary | ICD-10-CM | POA: Insufficient documentation

## 2018-11-30 DIAGNOSIS — C673 Malignant neoplasm of anterior wall of bladder: Secondary | ICD-10-CM | POA: Insufficient documentation

## 2018-11-30 DIAGNOSIS — Z79899 Other long term (current) drug therapy: Secondary | ICD-10-CM | POA: Diagnosis not present

## 2018-11-30 DIAGNOSIS — Z9221 Personal history of antineoplastic chemotherapy: Secondary | ICD-10-CM | POA: Insufficient documentation

## 2018-11-30 DIAGNOSIS — Z923 Personal history of irradiation: Secondary | ICD-10-CM | POA: Diagnosis not present

## 2018-11-30 NOTE — Progress Notes (Signed)
Radiation Oncology         [phone] ________________________________  Name: Anthony Pacheco MRN: [id number]  Date: 11/30/2018  DOB: 03/30/1936  Follow-Up Visit Note  CC: Anthony Pacheco, L.Anthony Sa, MD  Anthony Portela, MD    ICD-10-CM   1. Malignant neoplasm of anterior wall of urinary bladder (HCC) C67.3     Diagnosis:   Stage T2 N0 Urothelial Carcinoma, grade 3, bladder  Interval Since Last Radiation:  5 weeks and 1 day  09/04/2018 - 10/25/2018: Bladder (+boost) / 45 Gy in 25 fractions (+19.8 Gy in 11 fractions), 64.8 Gy cumulative  Narrative:  The patient returns today for routine follow-up after completion of radiotherapy to the bladder on 10/25/2018. He is accompanied by his wife. He completed chemotherapy on 10/16/2018, and he last saw Dr. Alen Pacheco on 11/23/2018.  CT chest/abdomen/pelvis on 11/21/2018 showed stable bladder wall thickening with no new or progressive findings or metastatic disease.   He reports doing well overall. He reports improving fatigue, nocturia x1-2, complete bladder emptying, and steady urine stream. He denies any pain, dysuria or hematuria, rectal bleeding, and diarrhea or constipation.                  ALLERGIES:  has No Known Allergies.  Meds: Current Outpatient Medications  Medication Sig Dispense Refill  . levothyroxine (SYNTHROID, LEVOTHROID) 100 MCG tablet Take 100 mcg by mouth daily.  3   No current facility-administered medications for this encounter.     Physical Findings: The patient is in no acute distress. Patient is alert and oriented.  weight is 185 lb 3.2 oz (84 kg). His oral temperature is 97.8 F (36.6 C). His blood pressure is 151/80 (abnormal) and his pulse is 89. His respiration is 18 and oxygen saturation is 100%. .  No significant changes. Lungs are clear to auscultation bilaterally. Heart has regular rate and rhythm. No palpable cervical, supraclavicular, or axillary adenopathy. Abdomen soft, non-tender, normal bowel sounds.   Lab  Findings: Lab Results  Component Value Date   WBC 5.1 11/21/2018   HGB 13.1 11/21/2018   HCT 39.3 11/21/2018   MCV 96.8 11/21/2018   PLT 172 11/21/2018    Radiographic Findings: Ct Chest W Contrast  Result Date: 11/21/2018 CLINICAL DATA:  Bladder cancer. EXAM: CT CHEST, ABDOMEN, AND PELVIS WITH CONTRAST TECHNIQUE: Multidetector CT imaging of the chest, abdomen and pelvis was performed following the standard protocol during bolus administration of intravenous contrast. CONTRAST:  131mL OMNIPAQUE IOHEXOL 300 MG/ML  SOLN COMPARISON:  08/07/2018 FINDINGS: CT CHEST FINDINGS Cardiovascular: The heart size is normal. No substantial pericardial effusion. Atherosclerotic calcification is noted in the wall of the thoracic aorta. Mediastinum/Nodes: No mediastinal lymphadenopathy. There is no hilar lymphadenopathy. The esophagus has normal imaging features. There is no axillary lymphadenopathy. Lungs/Pleura: The central tracheobronchial airways are patent. Stable appearance biapical pleuroparenchymal scarring. Calcified and noncalcified pleural plaques identified anterior right hemithorax. No suspicious pulmonary nodule or mass. No focal airspace consolidation. No pleural effusion. Calcified granuloma noted posterior left upper lobe. Musculoskeletal: No worrisome lytic or sclerotic osseous abnormality. CT ABDOMEN PELVIS FINDINGS Hepatobiliary: No suspicious focal abnormality within the liver parenchyma. Gallstones again noted. No intrahepatic or extrahepatic biliary dilation. Pancreas: No focal mass lesion. No dilatation of the main duct. No intraparenchymal cyst. No peripancreatic edema. Spleen: No splenomegaly. No focal mass lesion. Adrenals/Urinary Tract: No adrenal nodule or mass. Kidneys unremarkable. No evidence for hydroureter. Delayed imaging shows no wall thickening or soft tissue filling defect in either intrarenal collecting  system or renal pelvis. Left ureter well opacified and unremarkable. Distal  right ureter unopacified, but no focal dilatation or mass lesion noted along the length of the right ureter. The anterior bladder wall thickening, right slightly more than left, is similar to prior. The urinary bladder appears normal for the degree of distention. Stomach/Bowel: Stomach is unremarkable. No gastric wall thickening. No evidence of outlet obstruction. Duodenum is normally positioned as is the ligament of Treitz. No small bowel wall thickening. No small bowel dilatation. The terminal ileum is normal. The appendix is normal. No gross colonic mass. No colonic wall thickening. Diverticular changes are noted in the left colon without evidence of diverticulitis. Vascular/Lymphatic: There is abdominal aortic atherosclerosis without aneurysm. There is no gastrohepatic or hepatoduodenal ligament lymphadenopathy. No intraperitoneal or retroperitoneal lymphadenopathy. No pelvic sidewall lymphadenopathy. Reproductive: TURP defect noted central prostate gland. Other: No intraperitoneal free fluid. Musculoskeletal: Small bilateral groin hernias contain only fat. No worrisome lytic or sclerotic osseous abnormality. IMPRESSION: 1. Stable exam. Mild anterior bladder wall thickening, right greater than left, is similar to prior. 2. No new or progressive findings. Specifically, no evidence for metastatic disease in the chest, abdomen, or pelvis. 3. Cholelithiasis. 4. Small bilateral groin hernias contain only fat. 5.  Aortic Atherosclerois (ICD10-170.0) Electronically Signed   By: Anthony Pacheco M.D.   On: 11/21/2018 14:11   Ct Abdomen Pelvis W Contrast  Result Date: 11/21/2018 CLINICAL DATA:  Bladder cancer. EXAM: CT CHEST, ABDOMEN, AND PELVIS WITH CONTRAST TECHNIQUE: Multidetector CT imaging of the chest, abdomen and pelvis was performed following the standard protocol during bolus administration of intravenous contrast. CONTRAST:  167mL OMNIPAQUE IOHEXOL 300 MG/ML  SOLN COMPARISON:  08/07/2018 FINDINGS: CT CHEST  FINDINGS Cardiovascular: The heart size is normal. No substantial pericardial effusion. Atherosclerotic calcification is noted in the wall of the thoracic aorta. Mediastinum/Nodes: No mediastinal lymphadenopathy. There is no hilar lymphadenopathy. The esophagus has normal imaging features. There is no axillary lymphadenopathy. Lungs/Pleura: The central tracheobronchial airways are patent. Stable appearance biapical pleuroparenchymal scarring. Calcified and noncalcified pleural plaques identified anterior right hemithorax. No suspicious pulmonary nodule or mass. No focal airspace consolidation. No pleural effusion. Calcified granuloma noted posterior left upper lobe. Musculoskeletal: No worrisome lytic or sclerotic osseous abnormality. CT ABDOMEN PELVIS FINDINGS Hepatobiliary: No suspicious focal abnormality within the liver parenchyma. Gallstones again noted. No intrahepatic or extrahepatic biliary dilation. Pancreas: No focal mass lesion. No dilatation of the main duct. No intraparenchymal cyst. No peripancreatic edema. Spleen: No splenomegaly. No focal mass lesion. Adrenals/Urinary Tract: No adrenal nodule or mass. Kidneys unremarkable. No evidence for hydroureter. Delayed imaging shows no wall thickening or soft tissue filling defect in either intrarenal collecting system or renal pelvis. Left ureter well opacified and unremarkable. Distal right ureter unopacified, but no focal dilatation or mass lesion noted along the length of the right ureter. The anterior bladder wall thickening, right slightly more than left, is similar to prior. The urinary bladder appears normal for the degree of distention. Stomach/Bowel: Stomach is unremarkable. No gastric wall thickening. No evidence of outlet obstruction. Duodenum is normally positioned as is the ligament of Treitz. No small bowel wall thickening. No small bowel dilatation. The terminal ileum is normal. The appendix is normal. No gross colonic mass. No colonic wall  thickening. Diverticular changes are noted in the left colon without evidence of diverticulitis. Vascular/Lymphatic: There is abdominal aortic atherosclerosis without aneurysm. There is no gastrohepatic or hepatoduodenal ligament lymphadenopathy. No intraperitoneal or retroperitoneal lymphadenopathy. No pelvic sidewall lymphadenopathy. Reproductive: TURP  defect noted central prostate gland. Other: No intraperitoneal free fluid. Musculoskeletal: Small bilateral groin hernias contain only fat. No worrisome lytic or sclerotic osseous abnormality. IMPRESSION: 1. Stable exam. Mild anterior bladder wall thickening, right greater than left, is similar to prior. 2. No new or progressive findings. Specifically, no evidence for metastatic disease in the chest, abdomen, or pelvis. 3. Cholelithiasis. 4. Small bilateral groin hernias contain only fat. 5.  Aortic Atherosclerois (ICD10-170.0) Electronically Signed   By: Anthony Pacheco M.D.   On: 11/21/2018 14:11    Impression:  Stage T2 N0 Urothelial Carcinoma, grade 3, anterior bladder  He has recovered extremely well and has no side effects at this time. Recent exams on 11/21/2018 showed some mild anterior bladder wall thickening, similar to previous scans. No new or progressive findings or metastatic disease.  Plan:  PRN follow up in radiation oncology. Patient was under the impression that I would perform cystoscopy for him. Discussed with him that I am not qualified to do that procedure. Recommended that he call Dr. Delaney Meigs office to schedule follow up appointment. The patient understands that he may have to go to [facility] if biopsies of the bladder are planned. He will continue close follow-up with Dr. Alen Pacheco with blood work and CT scans scheduled every 6 months  -----------------------------------  Blair Promise, PhD, MD  This document serves as a record of services personally performed by Gery Pray, MD. It was created on his behalf by Wilburn Mylar, a  trained medical scribe. The creation of this record is based on the scribe's personal observations and the provider's statements to them. This document has been checked and approved by the attending provider.

## 2018-11-30 NOTE — Progress Notes (Signed)
Pt presents today for f/u with Dr. Sondra Come. Pt is accompanied by wife. Pt denies c/o pain. Pt reports fatigue is improved. Pt denies dysuria/hematuria. Pt denies rectal bleeding, diarrhea/constipation. Pt reports nocturia 1-2/noc. Pt reports complete bladder emptying. Pt reports steady urine stream.   Pt and wife were under that impression that Dr. Sondra Come was going to perform a cystoscopy today. Pt and wife were encouraged to contact pt's urologist.   BP (!) 151/80 (BP Location: Left Arm, Patient Position: Sitting)   Pulse 89   Temp 97.8 F (36.6 C) (Oral)   Resp 18   Wt 185 lb 3.2 oz (84 kg)   SpO2 100%   BMI 25.83 kg/m   Wt Readings from Last 3 Encounters:  11/30/18 185 lb 3.2 oz (84 kg)  11/23/18 186 lb 1.6 oz (84.4 kg)  10/16/18 184 lb 3.2 oz (83.6 kg)   [name], RN BSN

## 2018-12-01 ENCOUNTER — Telehealth: Payer: Self-pay

## 2018-12-01 NOTE — Telephone Encounter (Signed)
Received call from patient stating that he would like to make a follow up appointment with Dr. Tresa Endo urologist but the office told him that he needed an okay from Dr. Alen Blew and Dr. Sondra Come. Per Dr. Alen Blew he agrees with patient needing follow up with Dr. Rosana Hoes. Message left for Dr. Clabe Seal nurse for follow up. Spoke with Dr. Rosana Hoes' nurse and she stated that the patient does not need a referral but just needs to call and speak with a scheduler to get a return to clinic appointment with Dr. Rosana Hoes scheduled. Contacted patient and made him aware of above information and that if he has any difficulty scheduling the appointment or any other questions to call back. Patient appreciative and had no other questions or concerns.

## 2019-04-19 IMAGING — CT CT CHEST W/ CM
3 of 11 series · 11 of 46 positions shown, 17 images · IV contrast (OMNIPAQUE)
Comparison: 08/07/2018

CLINICAL DATA: Bladder cancer.

EXAM:
CT CHEST, ABDOMEN, AND PELVIS WITH CONTRAST
TECHNIQUE: Multidetector CT imaging of the chest, abdomen and pelvis was
performed following the standard protocol during bolus
administration of intravenous contrast.
CONTRAST:  100mL OMNIPAQUE IOHEXOL 300 MG/ML  SOLN

[Series 4: axial post · axial · 0.77mm/px · z∈[-456,-106]mm · 6 of 98 slices shown, 11 images]
[im 14/98  soft-tissue]
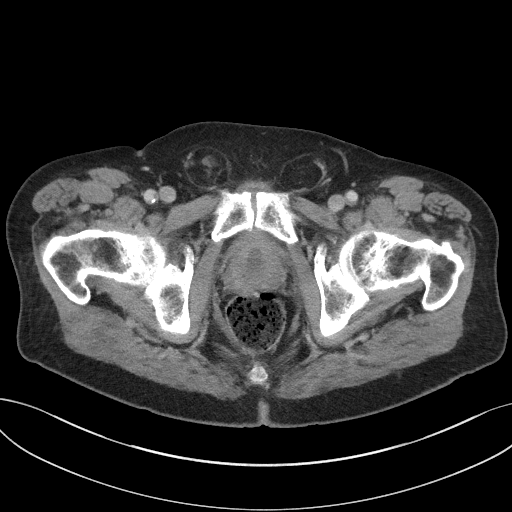
[im 14/98  bone]
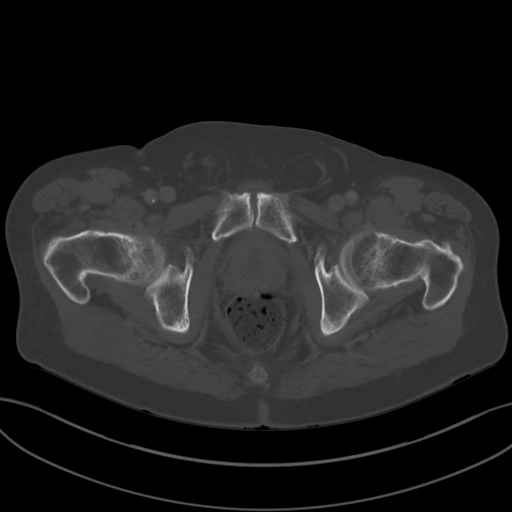
[im 28/98  soft-tissue]
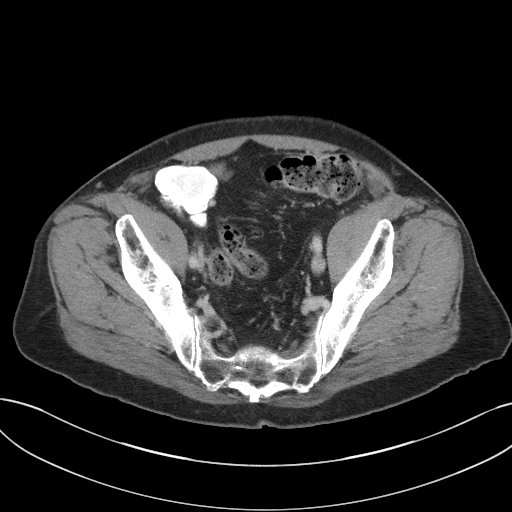
[im 42/98  soft-tissue]
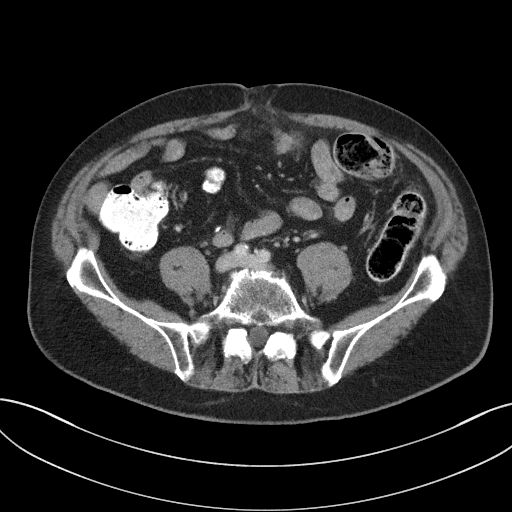
[im 42/98  lung]
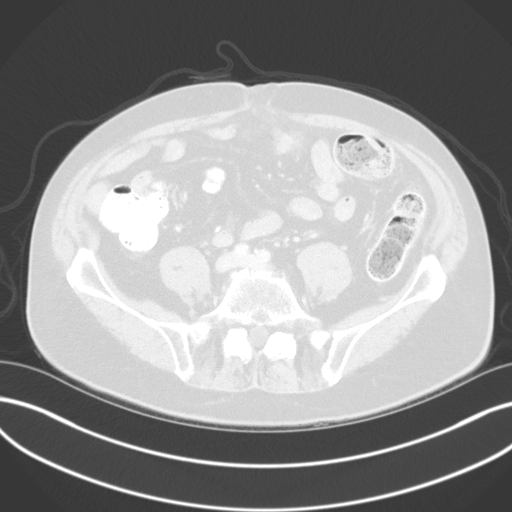
[im 56/98  soft-tissue]
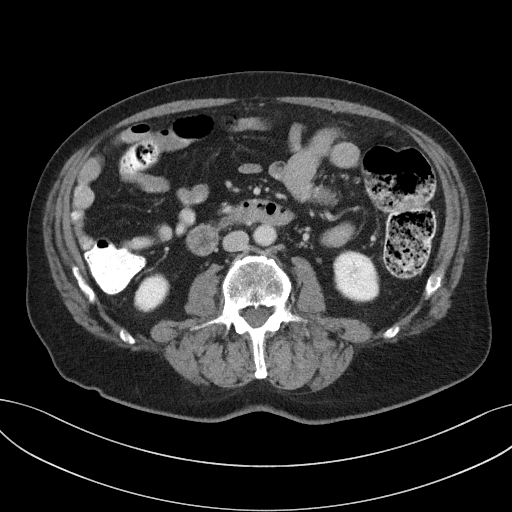
[im 56/98  lung]
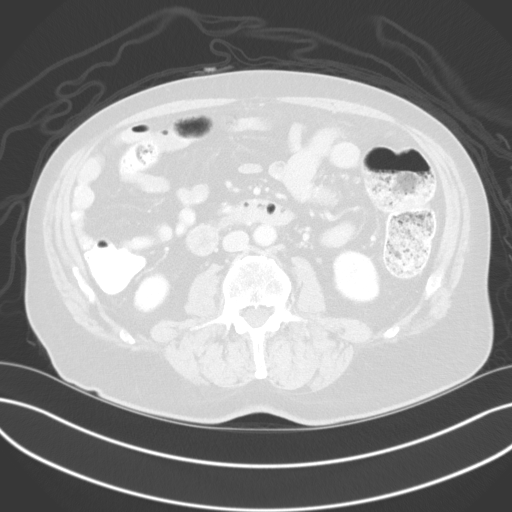
[im 70/98  soft-tissue]
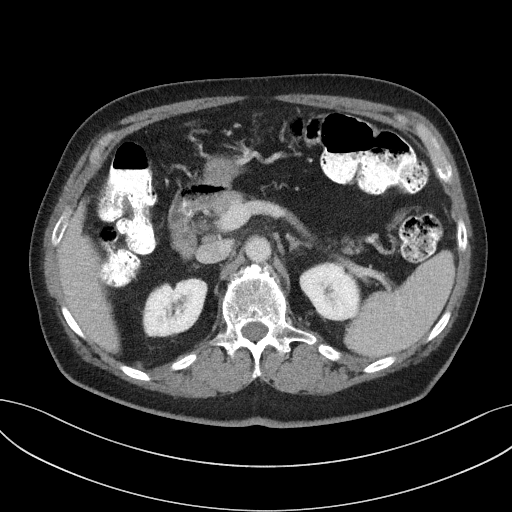
[im 70/98  lung]
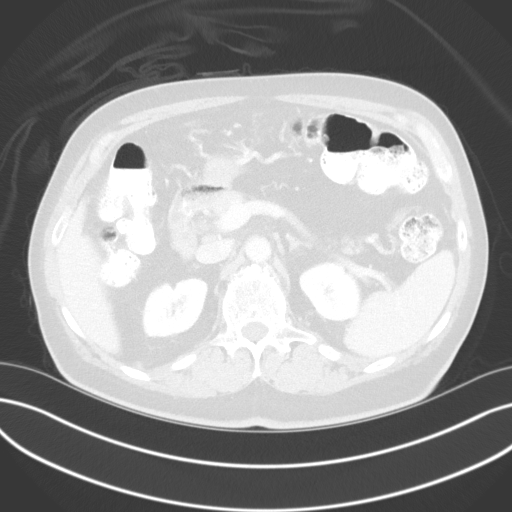
[im 84/98  soft-tissue]
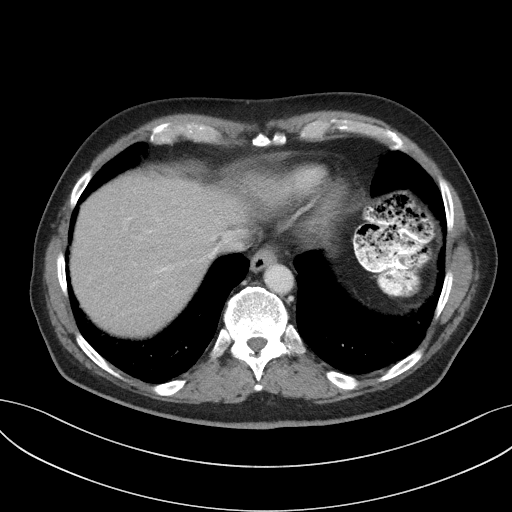
[im 84/98  lung]
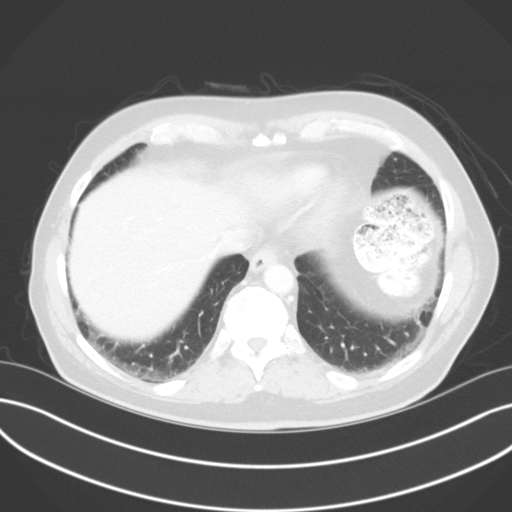

[Series 13: axial delay · axial · delayed · 0.83mm/px · z∈[-378,-238]mm · 3 of 98 slices shown]
[im 14/98  soft-tissue]
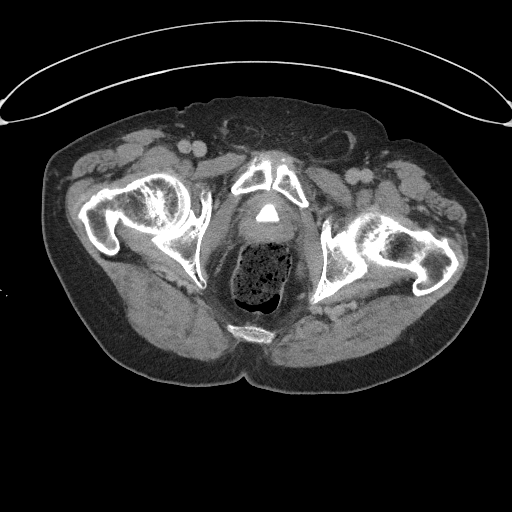
[im 28/98  soft-tissue]
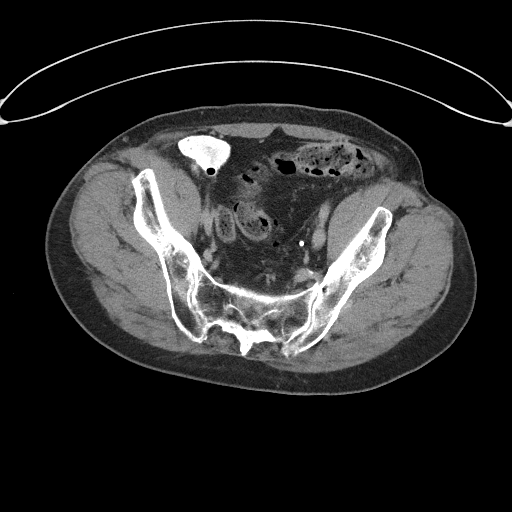
[im 42/98  soft-tissue]
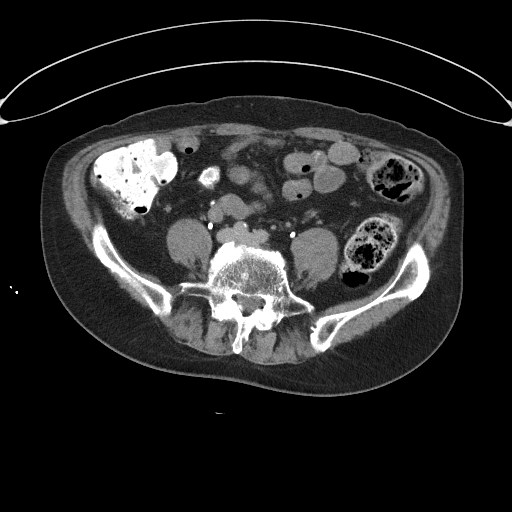

[Series 15: coronal delay · coronal · delayed · 0.88mm/px · 2 of 99 slices shown, 3 images]
[im 33/99  soft-tissue]
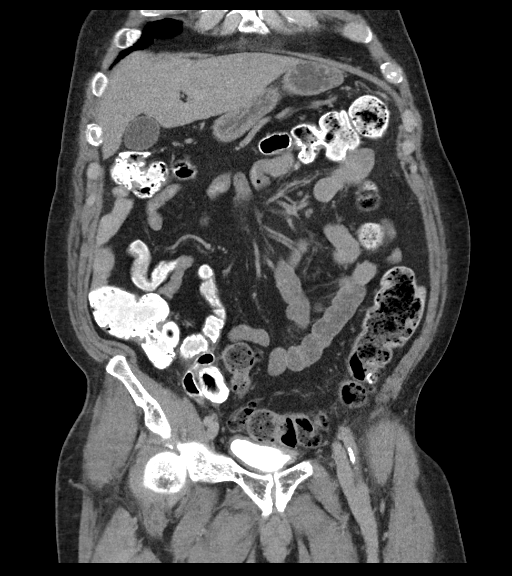
[im 33/99  bone]
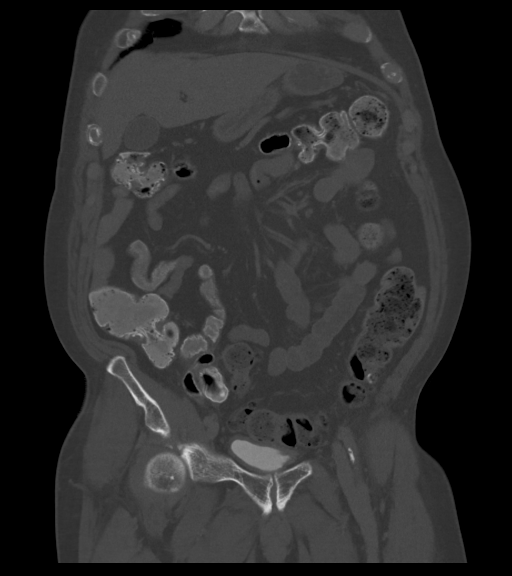
[im 66/99  soft-tissue]
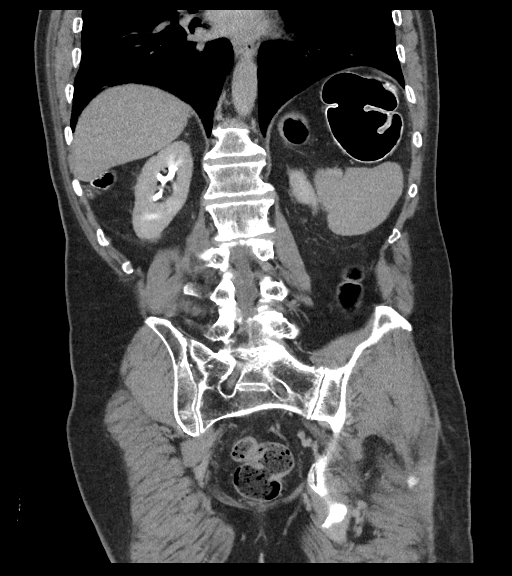

[11 of 46 positions shown; findings below may reference images not displayed]

FINDINGS: CT CHEST FINDINGS

Cardiovascular: The heart size is normal. No substantial pericardial
effusion. Atherosclerotic calcification is noted in the wall of the
thoracic aorta.

Mediastinum/Nodes: No mediastinal lymphadenopathy. There is no hilar
lymphadenopathy. The esophagus has normal imaging features. There is
no axillary lymphadenopathy.

Lungs/Pleura: The central tracheobronchial airways are patent.
Stable appearance biapical pleuroparenchymal scarring. Calcified and
noncalcified pleural plaques identified anterior right hemithorax.
No suspicious pulmonary nodule or mass. No focal airspace
consolidation. No pleural effusion. Calcified granuloma noted
posterior left upper lobe.

Musculoskeletal: No worrisome lytic or sclerotic osseous
abnormality.

CT ABDOMEN PELVIS FINDINGS

Hepatobiliary: No suspicious focal abnormality within the liver
parenchyma. Gallstones again noted. No intrahepatic or extrahepatic
biliary dilation.

Pancreas: No focal mass lesion. No dilatation of the main duct. No
intraparenchymal cyst. No peripancreatic edema.

Spleen: No splenomegaly. No focal mass lesion.

Adrenals/Urinary Tract: No adrenal nodule or mass. Kidneys
unremarkable. No evidence for hydroureter.

Delayed imaging shows no wall thickening or soft tissue filling
defect in either intrarenal collecting system or renal pelvis. Left
ureter well opacified and unremarkable. Distal right ureter
unopacified, but no focal dilatation or mass lesion noted along the
length of the right ureter. The anterior bladder wall thickening,
right slightly more than left, is similar to prior. The urinary
bladder appears normal for the degree of distention.

Stomach/Bowel: Stomach is unremarkable. No gastric wall thickening.
No evidence of outlet obstruction. Duodenum is normally positioned
as is the ligament of Treitz. No small bowel wall thickening. No
small bowel dilatation. The terminal ileum is normal. The appendix
is normal. No gross colonic mass. No colonic wall thickening.
Diverticular changes are noted in the left colon without evidence of
diverticulitis.

Vascular/Lymphatic: There is abdominal aortic atherosclerosis
without aneurysm. There is no gastrohepatic or hepatoduodenal
ligament lymphadenopathy. No intraperitoneal or retroperitoneal
lymphadenopathy. No pelvic sidewall lymphadenopathy.

Reproductive: TURP defect noted central prostate gland.

Other: No intraperitoneal free fluid.

Musculoskeletal: Small bilateral groin hernias contain only fat. No
worrisome lytic or sclerotic osseous abnormality.
IMPRESSION: 1. Stable exam. Mild anterior bladder wall thickening, right greater
than left, is similar to prior.
2. No new or progressive findings. Specifically, no evidence for
metastatic disease in the chest, abdomen, or pelvis.
3. Cholelithiasis.
4. Small bilateral groin hernias contain only fat.
5.  Aortic Atherosclerois (I2CA2-170.0)

## 2019-05-22 ENCOUNTER — Encounter (HOSPITAL_COMMUNITY): Payer: Self-pay

## 2019-05-22 ENCOUNTER — Ambulatory Visit (HOSPITAL_COMMUNITY)
Admission: RE | Admit: 2019-05-22 | Discharge: 2019-05-22 | Disposition: A | Discharge status: Home / Self Care | Payer: Medicare Other | Source: Ambulatory Visit | Attending: Oncology | Admitting: Oncology

## 2019-05-22 ENCOUNTER — Inpatient Hospital Stay: Payer: Medicare Other | Attending: Oncology

## 2019-05-22 ENCOUNTER — Other Ambulatory Visit: Payer: Self-pay

## 2019-05-22 DIAGNOSIS — C679 Malignant neoplasm of bladder, unspecified: Secondary | ICD-10-CM | POA: Insufficient documentation

## 2019-05-22 DIAGNOSIS — Z923 Personal history of irradiation: Secondary | ICD-10-CM | POA: Diagnosis not present

## 2019-05-22 DIAGNOSIS — N289 Disorder of kidney and ureter, unspecified: Secondary | ICD-10-CM | POA: Diagnosis not present

## 2019-05-22 DIAGNOSIS — Z9221 Personal history of antineoplastic chemotherapy: Secondary | ICD-10-CM | POA: Insufficient documentation

## 2019-05-22 DIAGNOSIS — Z8551 Personal history of malignant neoplasm of bladder: Secondary | ICD-10-CM | POA: Insufficient documentation

## 2019-05-22 LAB — CMP (CANCER CENTER ONLY)
ALT: 10 U/L (ref 0–44)
AST: 15 U/L (ref 15–41)
Albumin: 4.1 g/dL (ref 3.5–5.0)
Alkaline Phosphatase: 50 U/L (ref 38–126)
Anion gap: 10 (ref 5–15)
BUN: 17 mg/dL (ref 8–23)
CO2: 25 mmol/L (ref 22–32)
Calcium: 9.3 mg/dL (ref 8.9–10.3)
Chloride: 109 mmol/L (ref 98–111)
Creatinine: 1.62 mg/dL — ABNORMAL HIGH (ref 0.61–1.24)
GFR, Est AFR Am: 46 mL/min — ABNORMAL LOW (ref >60)
GFR, Estimated: 39 mL/min — ABNORMAL LOW (ref >60)
Glucose, Bld: 100 mg/dL — ABNORMAL HIGH (ref 70–99)
Potassium: 5 mmol/L (ref 3.5–5.1)
Sodium: 144 mmol/L (ref 135–145)
Total Bilirubin: 0.6 mg/dL (ref 0.3–1.2)
Total Protein: 6.9 g/dL (ref 6.5–8.1)

## 2019-05-22 LAB — CBC WITH DIFFERENTIAL (CANCER CENTER ONLY)
Abs Immature Granulocytes: 0.05 10*3/uL (ref 0.00–0.07)
Basophils Absolute: 0 10*3/uL (ref 0.0–0.1)
Basophils Relative: 1 %
Eosinophils Absolute: 0.1 10*3/uL (ref 0.0–0.5)
Eosinophils Relative: 2 %
HCT: 41.3 % (ref 39.0–52.0)
Hemoglobin: 13.5 g/dL (ref 13.0–17.0)
Immature Granulocytes: 1 %
Lymphocytes Relative: 23 %
Lymphs Abs: 1.2 10*3/uL (ref 0.7–4.0)
MCH: 30.8 pg (ref 26.0–34.0)
MCHC: 32.7 g/dL (ref 30.0–36.0)
MCV: 94.1 fL (ref 80.0–100.0)
Monocytes Absolute: 0.5 10*3/uL (ref 0.1–1.0)
Monocytes Relative: 9 %
Neutro Abs: 3.5 10*3/uL (ref 1.7–7.7)
Neutrophils Relative %: 64 %
Platelet Count: 195 10*3/uL (ref 150–400)
RBC: 4.39 MIL/uL (ref 4.22–5.81)
RDW: 12.5 % (ref 11.5–15.5)
WBC Count: 5.4 10*3/uL (ref 4.0–10.5)
nRBC: 0 % (ref 0.0–0.2)

## 2019-05-22 MED ORDER — IOHEXOL 300 MG/ML  SOLN
80.0000 mL | Freq: Once | INTRAMUSCULAR | Status: AC | PRN
  Administered 2019-05-22: 11:00:00 75 mL via INTRAVENOUS

## 2019-05-22 MED ORDER — SODIUM CHLORIDE 0.9 % IV SOLN
INTRAVENOUS | Status: AC
  Filled 2019-05-22: qty 250

## 2019-05-22 MED ORDER — SODIUM CHLORIDE (PF) 0.9 % IJ SOLN
INTRAMUSCULAR | Status: AC
  Filled 2019-05-22: qty 50

## 2019-05-24 ENCOUNTER — Other Ambulatory Visit: Payer: Self-pay

## 2019-05-24 ENCOUNTER — Inpatient Hospital Stay (HOSPITAL_BASED_OUTPATIENT_CLINIC_OR_DEPARTMENT_OTHER): Payer: Medicare Other | Admitting: Oncology

## 2019-05-24 VITALS — BP 136/79 | HR 82 | Temp 98.5°F | Resp 18 | Ht 71.0 in | Wt 183.9 lb

## 2019-05-24 DIAGNOSIS — C679 Malignant neoplasm of bladder, unspecified: Secondary | ICD-10-CM

## 2019-05-24 DIAGNOSIS — Z8551 Personal history of malignant neoplasm of bladder: Secondary | ICD-10-CM | POA: Diagnosis not present

## 2019-05-24 NOTE — Progress Notes (Signed)
Hematology and Oncology Follow Up Visit  Anthony Pacheco [id number] 11/05/1935 83 y.o. 05/24/2019 10:22 AM Anthony Pacheco, L.Anthony Pacheco, MDMitchell, L.Anthony Sa, MD   Principle Diagnosis: 83 year old with T2N0 urothelial carcinoma of the bladder diagnosed in 2019,   Prior Therapy:  TURBT on July 18, 2018.  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  He was found to have T2 lesion with grade 3 histology.   Radiation therapy with weekly carboplatin completed and January 2020.  Current therapy: Active surveillance.  Interim History: Anthony Pacheco returns today for a repeat evaluation.  Since the last visit, he reports no recent complaints.  He remains active and continues to attend activities of daily living.  He denies any abdominal pain, hematuria or urinary.  He denies any frequency urgency or hesitancy.  He denies any flank pain or bone pain.  He denied headaches, blurry vision, syncope or seizures.  Denies any fevers, chills or sweats.  Denied chest pain, palpitation, orthopnea or leg edema.  Denied cough, wheezing or hemoptysis.  Denied nausea, vomiting or abdominal pain.  Denies any constipation or diarrhea.  Denies any frequency urgency or hesitancy.  Denies any arthralgias or myalgias.  Denies any skin rashes or lesions.  Denies any bleeding or clotting tendency.  Denies any easy bruising.  Denies any hair or nail changes.  Denies any anxiety or depression.  Remaining review of system is negative.       Medications: Updated on review without changes. Current Outpatient Medications  Medication Sig Dispense Refill  . levothyroxine (SYNTHROID, LEVOTHROID) 100 MCG tablet Take 100 mcg by mouth daily.  3   No current facility-administered medications for this visit.      Allergies: No Known Allergies  Past Medical History, Surgical history, Social history, and Family History reviewed without any  changes.   Physical Exam: Blood pressure 136/79, pulse 82, temperature 98.5 F (36.9 C), temperature source Oral, resp. rate 18, height 5\' 11"  (1.803 m), weight 183 lb 14.4 oz (83.4 kg), SpO2 100 %.    ECOG: 1    General appearance: Alert, awake without any distress. Head: Atraumatic without abnormalities Oropharynx: Without any thrush or ulcers. Eyes: No scleral icterus. Lymph nodes: No lymphadenopathy noted in the cervical, supraclavicular, or axillary nodes Heart:regular rate and rhythm, without any murmurs or gallops.   Lung: Clear to auscultation without any rhonchi, wheezes or dullness to percussion. Abdomin: Soft, nontender without any shifting dullness or ascites. Musculoskeletal: No clubbing or cyanosis. Neurological: No motor or sensory deficits. Skin: No rashes or lesions. Psychiatric: Mood and affect appeared normal.          Lab Results: Lab Results  Component Value Date   WBC 5.4 05/22/2019   HGB 13.5 05/22/2019   HCT 41.3 05/22/2019   MCV 94.1 05/22/2019   PLT 195 05/22/2019     Chemistry      Component Value Date/Time   NA 144 05/22/2019 0750   K 5.0 05/22/2019 0750   CL 109 05/22/2019 0750   CO2 25 05/22/2019 0750   BUN 17 05/22/2019 0750   CREATININE 1.62 (H) 05/22/2019 0750      Component Value Date/Time   CALCIUM 9.3 05/22/2019 0750   ALKPHOS 50 05/22/2019 0750   AST 15 05/22/2019 0750   ALT 10 05/22/2019 0750   BILITOT 0.6 05/22/2019 0750      CLINICAL DATA:  History of bladder cancer, chemotherapy and XRT complete  EXAM: CT CHEST, ABDOMEN, AND  PELVIS WITH CONTRAST  TECHNIQUE: Multidetector CT imaging of the chest, abdomen and pelvis was performed following the standard protocol during bolus administration of intravenous contrast.  CONTRAST:  17mL OMNIPAQUE IOHEXOL 300 MG/ML  SOLN  COMPARISON:  11/21/2018  FINDINGS: CT CHEST FINDINGS  Cardiovascular: Heart is normal in size.  No pericardial effusion.  No  evidence of thoracic aortic aneurysm. Mild atherosclerotic calcifications of the aortic arch.  Mediastinum/Nodes: No suspicious mediastinal lymphadenopathy.  Visualized thyroid is unremarkable.  Lungs/Pleura: Biapical pleural-parenchymal scarring.  No suspicious pulmonary nodules.  Calcified pleural plaques on the right.  Mild dependent atelectasis in the bilateral lower lobes.  No subpleural reticulation/fibrosis.  No pleural effusion or pneumothorax.  Musculoskeletal: Degenerative changes of the thoracic spine.  CT ABDOMEN PELVIS FINDINGS  Hepatobiliary: Liver is within normal limits.  Layering gallstones (series 2/image 66), without associated inflammatory changes. No intrahepatic or extrahepatic ductal dilatation.  Pancreas: Within normal limits.  Spleen: Within normal limits.  Adrenals/Urinary Tract: Adrenal glands are within normal limits.  Kidneys are within normal limits.  No hydronephrosis.  On delayed imaging, there are no filling defects in the bilateral opacified proximal collecting systems, ureters, or bladder.  Mild anterior bladder wall thickening with right anterior bladder extending towards a right inguinal hernia (series 2/image 118), unchanged.  Stomach/Bowel: Stomach is within normal limits.  No evidence of bowel obstruction.  Normal appendix (series 2/image 97).  Left colonic diverticulosis, without evidence of diverticulitis.  Vascular/Lymphatic: No evidence of abdominal aortic aneurysm.  Atherosclerotic calcifications of the abdominal aorta and branch vessels.  No suspicious abdominopelvic lymphadenopathy.  Reproductive: Postsurgical changes related to prior TURP.  Other: No abdominopelvic ascites.  Small fat containing bilateral inguinal hernias. Right inguinal hernia contains fluid. Right anterior bladder extrudes towards the hernia, unchanged.  Musculoskeletal: Degenerative changes of the lumbar  spine.  IMPRESSION: Stable mild anterior bladder wall thickening.  No findings suspicious for metastatic disease.  Additional stable ancillary findings as above.   Impression and Plan:  83 year old man with:  1.    Bladder cancer diagnosed in October 2019 after presenting with T2N0 urothelial carcinoma.  He is currently on active surveillance after completing definitive therapy with radiation and weekly chemotherapy.  The natural course of this disease as well as risk of relapse was assessed today.  CT scan on 05/22/2019 was personally reviewed and showed no disease relapse.  I recommended continued active surveillance at this time including surveillance cystoscopy and periodic imaging.  Is next scan will be in the next 6 to 12 months pending the results of his cystoscopy.  He prefers to have it done in 12 months and will be done sooner if needed.   2.    Renal insufficiency: His creatinine is slightly worse but overall no changes in his urinary habits.  This will continue to be monitored at that time.  3.  Follow-up: In 12 months and sooner if needed.  15  minutes was spent with the patient face-to-face today.  More than 50% of time was spent updating his disease status, reviewing imaging studies as well as discussing future plan of care.    Zola Button, MD 8/27/202010:22 AM

## 2019-05-25 ENCOUNTER — Telehealth: Payer: Self-pay | Admitting: Oncology

## 2019-05-25 NOTE — Telephone Encounter (Signed)
Called and left msg. Mailed printout  °

## 2020-05-06 DIAGNOSIS — E039 Hypothyroidism, unspecified: Secondary | ICD-10-CM | POA: Diagnosis not present

## 2020-05-20 ENCOUNTER — Ambulatory Visit (HOSPITAL_COMMUNITY)
Admission: RE | Admit: 2020-05-20 | Discharge: 2020-05-20 | Disposition: A | Discharge status: Home / Self Care | Payer: Medicare PPO | Source: Ambulatory Visit | Attending: Oncology | Admitting: Oncology

## 2020-05-20 ENCOUNTER — Inpatient Hospital Stay: Payer: Medicare PPO | Attending: Oncology

## 2020-05-20 ENCOUNTER — Encounter (HOSPITAL_COMMUNITY): Payer: Self-pay

## 2020-05-20 ENCOUNTER — Other Ambulatory Visit: Payer: Self-pay

## 2020-05-20 DIAGNOSIS — Z8551 Personal history of malignant neoplasm of bladder: Secondary | ICD-10-CM | POA: Insufficient documentation

## 2020-05-20 DIAGNOSIS — J929 Pleural plaque without asbestos: Secondary | ICD-10-CM | POA: Diagnosis not present

## 2020-05-20 DIAGNOSIS — Z923 Personal history of irradiation: Secondary | ICD-10-CM | POA: Diagnosis not present

## 2020-05-20 DIAGNOSIS — Z8546 Personal history of malignant neoplasm of prostate: Secondary | ICD-10-CM | POA: Diagnosis not present

## 2020-05-20 DIAGNOSIS — C679 Malignant neoplasm of bladder, unspecified: Secondary | ICD-10-CM

## 2020-05-20 DIAGNOSIS — M954 Acquired deformity of chest and rib: Secondary | ICD-10-CM | POA: Diagnosis not present

## 2020-05-20 DIAGNOSIS — N289 Disorder of kidney and ureter, unspecified: Secondary | ICD-10-CM | POA: Insufficient documentation

## 2020-05-20 DIAGNOSIS — K5730 Diverticulosis of large intestine without perforation or abscess without bleeding: Secondary | ICD-10-CM | POA: Diagnosis not present

## 2020-05-20 DIAGNOSIS — Z5111 Encounter for antineoplastic chemotherapy: Secondary | ICD-10-CM | POA: Diagnosis not present

## 2020-05-20 DIAGNOSIS — I700 Atherosclerosis of aorta: Secondary | ICD-10-CM | POA: Diagnosis not present

## 2020-05-20 DIAGNOSIS — K8020 Calculus of gallbladder without cholecystitis without obstruction: Secondary | ICD-10-CM | POA: Diagnosis not present

## 2020-05-20 LAB — CMP (CANCER CENTER ONLY)
ALT: 7 U/L (ref 0–44)
AST: 14 U/L — ABNORMAL LOW (ref 15–41)
Albumin: 4 g/dL (ref 3.5–5.0)
Alkaline Phosphatase: 60 U/L (ref 38–126)
Anion gap: 7 (ref 5–15)
BUN: 14 mg/dL (ref 8–23)
CO2: 28 mmol/L (ref 22–32)
Calcium: 10.2 mg/dL (ref 8.9–10.3)
Chloride: 107 mmol/L (ref 98–111)
Creatinine: 1.46 mg/dL — ABNORMAL HIGH (ref 0.61–1.24)
GFR, Est AFR Am: 52 mL/min — ABNORMAL LOW (ref >60)
GFR, Estimated: 44 mL/min — ABNORMAL LOW (ref >60)
Glucose, Bld: 98 mg/dL (ref 70–99)
Potassium: 4.8 mmol/L (ref 3.5–5.1)
Sodium: 142 mmol/L (ref 135–145)
Total Bilirubin: 0.6 mg/dL (ref 0.3–1.2)
Total Protein: 7.2 g/dL (ref 6.5–8.1)

## 2020-05-20 LAB — CBC WITH DIFFERENTIAL (CANCER CENTER ONLY)
Abs Immature Granulocytes: 0.05 10*3/uL (ref 0.00–0.07)
Basophils Absolute: 0.1 10*3/uL (ref 0.0–0.1)
Basophils Relative: 1 %
Eosinophils Absolute: 0.2 10*3/uL (ref 0.0–0.5)
Eosinophils Relative: 3 %
HCT: 41.8 % (ref 39.0–52.0)
Hemoglobin: 13.5 g/dL (ref 13.0–17.0)
Immature Granulocytes: 1 %
Lymphocytes Relative: 20 %
Lymphs Abs: 1.3 10*3/uL (ref 0.7–4.0)
MCH: 29.9 pg (ref 26.0–34.0)
MCHC: 32.3 g/dL (ref 30.0–36.0)
MCV: 92.7 fL (ref 80.0–100.0)
Monocytes Absolute: 0.5 10*3/uL (ref 0.1–1.0)
Monocytes Relative: 8 %
Neutro Abs: 4.4 10*3/uL (ref 1.7–7.7)
Neutrophils Relative %: 67 %
Platelet Count: 226 10*3/uL (ref 150–400)
RBC: 4.51 MIL/uL (ref 4.22–5.81)
RDW: 12.1 % (ref 11.5–15.5)
WBC Count: 6.6 10*3/uL (ref 4.0–10.5)
nRBC: 0 % (ref 0.0–0.2)

## 2020-05-22 ENCOUNTER — Inpatient Hospital Stay: Payer: Medicare PPO | Admitting: Oncology

## 2020-05-22 ENCOUNTER — Other Ambulatory Visit: Payer: Self-pay

## 2020-05-22 VITALS — BP 143/71 | HR 79 | Temp 99.5°F | Resp 18 | Ht 71.0 in | Wt 184.9 lb

## 2020-05-22 DIAGNOSIS — Z923 Personal history of irradiation: Secondary | ICD-10-CM | POA: Diagnosis not present

## 2020-05-22 DIAGNOSIS — Z8551 Personal history of malignant neoplasm of bladder: Secondary | ICD-10-CM | POA: Diagnosis not present

## 2020-05-22 DIAGNOSIS — N289 Disorder of kidney and ureter, unspecified: Secondary | ICD-10-CM | POA: Diagnosis not present

## 2020-05-22 DIAGNOSIS — Z8546 Personal history of malignant neoplasm of prostate: Secondary | ICD-10-CM | POA: Diagnosis not present

## 2020-05-22 DIAGNOSIS — C679 Malignant neoplasm of bladder, unspecified: Secondary | ICD-10-CM | POA: Diagnosis not present

## 2020-05-22 NOTE — Progress Notes (Signed)
Hematology and Oncology Follow Up Visit  Anthony Pacheco [id number] January 10, 1936 84 y.o. 05/22/2020 9:37 AM [name], [name], MDMitchell, [name], MD   Principle Diagnosis: 84 year old with bladder cancer diagnosed in 2019.  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  Prior Therapy:  TURBT on July 18, 2018.  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  He was found to have T2 lesion with grade 3 histology.   Radiation therapy with weekly carboplatin completed and January 2020.  Current therapy: Active surveillance.  Interim History: Anthony Pacheco is here for a follow-up evaluation.  Since the last visit, he reports no major changes in his health.  He denies any recent hospitalization or illnesses.  He denies any abdominal pain, hematochezia or melena.  Continues to be active and attends to activities of daily living.       Medications: Unchanged on review. Current Outpatient Medications  Medication Sig Dispense Refill  . levothyroxine (SYNTHROID, LEVOTHROID) 100 MCG tablet Take 100 mcg by mouth daily.  3   No current facility-administered medications for this visit.     Allergies: No Known Allergies     Physical Exam: Blood pressure (!) 143/71, pulse 79, temperature 99.5 F (37.5 C), temperature source Tympanic, resp. rate 18, height 5\' 11"  (1.803 m), weight 184 lb 14.4 oz (83.9 kg), SpO2 99 %.     ECOG: 1   General appearance: Comfortable appearing without any discomfort Head: Normocephalic without any trauma Oropharynx: Mucous membranes are moist and pink without any thrush or ulcers. Eyes: Pupils are equal and round reactive to light. Lymph nodes: No cervical, supraclavicular, inguinal or axillary lymphadenopathy.   Heart:regular rate and rhythm.  S1 and S2 without leg edema. Lung: Clear without any rhonchi or wheezes.  No dullness  to percussion. Abdomin: Soft, nontender, nondistended with good bowel sounds.  No hepatosplenomegaly. Musculoskeletal: No joint deformity or effusion.  Full range of motion noted. Neurological: No deficits noted on motor, sensory and deep tendon reflex exam. Skin: No petechial rash or dryness.  Appeared moist.            Lab Results: Lab Results  Component Value Date   WBC 6.6 05/20/2020   HGB 13.5 05/20/2020   HCT 41.8 05/20/2020   MCV 92.7 05/20/2020   PLT 226 05/20/2020     Chemistry      Component Value Date/Time   NA 142 05/20/2020 0754   K 4.8 05/20/2020 0754   CL 107 05/20/2020 0754   CO2 28 05/20/2020 0754   BUN 14 05/20/2020 0754   CREATININE 1.46 (H) 05/20/2020 0754      Component Value Date/Time   CALCIUM 10.2 05/20/2020 0754   ALKPHOS 60 05/20/2020 0754   AST 14 (L) 05/20/2020 0754   ALT 7 05/20/2020 0754   BILITOT 0.6 05/20/2020 0754     IMPRESSION: Stable exam. No evidence of recurrent or metastatic carcinoma within the chest, abdomen, or pelvis.  Cholelithiasis. No radiographic evidence of cholecystitis.  Colonic diverticulosis, without radiographic evidence of diverticulitis.  Small bilateral inguinal hernias containing only fat.  Aortic Atherosclerosis (ICD10-I70.0).  Impression and Plan:  84 year old man with:  1.    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  The natural course of this disease was reviewed today and risk of relapse was assessed.  CT scan obtained on May 20, 2020 showed no evidence of relapsed disease at  this time.  I recommended continued active surveillance and he is scheduled to have a cystoscopy by Dr. Rosana Hoes in the near future.  I urged him to continue to do this part of the surveillance which is chronic in detecting localized disease.  He will have repeat imaging studies in 12 months.  Treatment options for advanced prostate cancer including immunotherapy systemic therapy  were reiterated and he is will only be deferred if he has disease relapse.  2.    Renal insufficiency: Kidney function remains stable at this time.  3.  Follow-up: He will return in 6 months and repeat imaging studies in 12 months.  30  minutes were dedicated to this encounter.  Time was spent on reviewing imaging studies, reviewing laboratory data, discussing treatment options and future plan of care review.    Zola Button, MD 8/26/20219:37 AM

## 2020-05-26 ENCOUNTER — Telehealth: Payer: Self-pay | Admitting: Oncology

## 2020-05-26 NOTE — Telephone Encounter (Signed)
Scheduled per 08/26 los, patient has been called and voicemail was left.

## 2020-06-30 DIAGNOSIS — N138 Other obstructive and reflux uropathy: Secondary | ICD-10-CM | POA: Diagnosis not present

## 2020-06-30 DIAGNOSIS — N401 Enlarged prostate with lower urinary tract symptoms: Secondary | ICD-10-CM | POA: Diagnosis not present

## 2020-06-30 DIAGNOSIS — Z8551 Personal history of malignant neoplasm of bladder: Secondary | ICD-10-CM | POA: Diagnosis not present

## 2020-06-30 DIAGNOSIS — Z87448 Personal history of other diseases of urinary system: Secondary | ICD-10-CM | POA: Diagnosis not present

## 2020-07-29 ENCOUNTER — Encounter: Payer: Self-pay | Admitting: Neurology

## 2020-07-29 DIAGNOSIS — R42 Dizziness and giddiness: Secondary | ICD-10-CM | POA: Diagnosis not present

## 2020-07-29 DIAGNOSIS — D489 Neoplasm of uncertain behavior, unspecified: Secondary | ICD-10-CM | POA: Diagnosis not present

## 2020-07-29 DIAGNOSIS — C44619 Basal cell carcinoma of skin of left upper limb, including shoulder: Secondary | ICD-10-CM | POA: Diagnosis not present

## 2020-07-29 DIAGNOSIS — R251 Tremor, unspecified: Secondary | ICD-10-CM | POA: Diagnosis not present

## 2020-08-05 NOTE — Progress Notes (Signed)
Assessment/Plan:   1.  Idiopathic Parkinson's disease.  The patient has tremor, bradykinesia, rigidity.  This is new dx 08/11/20  -We discussed the diagnosis as well as pathophysiology of the disease.  We discussed treatment options as well as prognostic indicators.  Patient education was provided.  -We discussed that it used to be thought that levodopa would increase risk of melanoma but now it is believed that Parkinsons itself likely increases risk of melanoma. he is to get regular skin checks.  He was just dx with a "skin CA" and has an upcoming appt soon for further evaluation.  -We decided to add carbidopa/levodopa 25/100.  1/2 tab tid x 1 wk, then 1/2 in am & noon & 1 at night for a week, then 1/2 in am &1 at noon &night for a week, then 1 po tid.  Risks, benefits, side effects and alternative therapies were discussed.  The opportunity to ask questions was given and they were answered to the best of my ability.  The patient expressed understanding and willingness to follow the outlined treatment protocols.  -We discussed community resources in the area including patient support groups and community exercise programs for PD and pt education was provided to the patient.   Subjective:   Anthony Pacheco was seen today in the movement disorders clinic for neurologic consultation at the request of [name], [name], MD.  The consultation is for the evaluation of rest tremor.  Outside records that were made available to me were reviewed.   Specific Symptoms:  Tremor: Yes.   , Patient describes both at rest and with activation.  Tremor is on the L.  Patient is left-handed.  Symptoms for about 7 months.  Patient attributes to a bug bite 7 months ago on the left wrist (note that primary care notes indicate that they think that this is suspicious for a cancer and a shave biopsy was done and pt confirms it was a CA - unknown type - he has an appt with derm soon).   Family hx of similar:   No. Voice: unknown Sleep: sleeps well  Vivid Dreams:  No.  Acting out dreams:  No. Wet Pillows: No. Postural symptoms:  No.  Falls?  No. Bradykinesia symptoms: slow movements but "I am 84 years old" Loss of smell:  No. Loss of taste:  No. Urinary Incontinence:  No. Difficulty Swallowing:  No. Handwriting, micrographia: No. but it has gotten shaky Trouble with ADL's:  No.  Trouble buttoning clothing: No. Depression:  No. Memory changes:  No. N/V:  No. Lightheaded:  Yes.  , only occurred one time.  Primary care records indicate that he was evaluated for an episode of dizziness that occurred at the end of October.  He had just finished lunch and was walking out to his car and started to feel the symptoms when he just started driving.  He stopped driving and the symptoms resolved.  It has never happened again per patient today  Syncope: No. Diplopia:  No. Dyskinesia:  No. Prior exposure to reglan/antipsychotics: No.  Neuroimaging of the brain has not previously been performed.    PREVIOUS MEDICATIONS: none to date  ALLERGIES:  No Known Allergies  CURRENT MEDICATIONS:  Current Outpatient Medications  Medication Instructions  . EUTHYROX 112 MCG tablet No dose, route, or frequency recorded.  Marland Kitchen levothyroxine (SYNTHROID) 100 mcg, Oral, Daily    Objective:   VITALS:   Vitals:   08/11/20 0902  BP: (!) 141/103  Pulse: 68  Resp: 20  SpO2: 99%  Weight: 180 lb (81.6 kg)  Height: 6' (1.829 m)    GEN:  The patient appears stated age and is in NAD. HEENT:  Normocephalic, atraumatic.  The mucous membranes are moist. The superficial temporal arteries are without ropiness or tenderness. CV:  RRR Lungs:  CTAB Neck/HEME:  There are no carotid bruits bilaterally.  Neurological examination:  Orientation: The patient is alert and oriented x3.  Cranial nerves: There is good facial symmetry. Extraocular muscles are intact. The visual fields are full to confrontational testing. The  speech is fluent and clear. Soft palate rises symmetrically and there is no tongue deviation. Hearing is decreased to conversational tone. Sensation: Sensation is intact to light and pinprick throughout (facial, trunk, extremities). Vibration is absent at the bilateral big toe but intact at the ankle. There is no extinction with double simultaneous stimulation. There is no sensory dermatomal level identified. Motor: Strength is 5/5 in the bilateral upper and lower extremities.   Shoulder shrug is equal and symmetric.  There is no pronator drift. Deep tendon reflexes: Deep tendon reflexes are 2/4 at the bilateral biceps, triceps, brachioradialis, patella and achilles. Plantar responses are downgoing bilaterally.  Movement examination: Tone: There is mild increased tone in the LUE/LLE.  Tone is nl on the R. Abnormal movements: there is LUE rest tremor that increases with distraction.  Rare RUE rest tremor with distraction ony Coordination:  There is mild decremation with RAM's, with any form of RAMS, including alternating supination and pronation of the forearm, hand opening and closing, finger taps, heel taps and toe taps on the right. Gait and Station: The patient has no difficulty arising out of a deep-seated chair without the use of the hands. The patient's stride length is good with decreased arm arm swing on the L.   I have reviewed and interpreted the following labs independently   Chemistry      Component Value Date/Time   NA 142 05/20/2020 0754   K 4.8 05/20/2020 0754   CL 107 05/20/2020 0754   CO2 28 05/20/2020 0754   BUN 14 05/20/2020 0754   CREATININE 1.46 (H) 05/20/2020 0754      Component Value Date/Time   CALCIUM 10.2 05/20/2020 0754   ALKPHOS 60 05/20/2020 0754   AST 14 (L) 05/20/2020 0754   ALT 7 05/20/2020 0754   BILITOT 0.6 05/20/2020 0754      No results found for: TSH Lab Results  Component Value Date   WBC 6.6 05/20/2020   HGB 13.5 05/20/2020   HCT 41.8  05/20/2020   MCV 92.7 05/20/2020   PLT 226 05/20/2020   Patient had lab work with primary care last in December, 2020.  His TSH at that point was 3.88.  Total time spent on today's visit was 60 minutes, including both face-to-face time and nonface-to-face time.  Time included that spent on review of records (prior notes available to me/labs/imaging if pertinent), discussing treatment and goals, answering patient's questions and coordinating care.  Cc:  [name], [name], MD

## 2020-08-11 ENCOUNTER — Encounter: Payer: Self-pay | Admitting: Neurology

## 2020-08-11 ENCOUNTER — Ambulatory Visit: Payer: Medicare PPO | Admitting: Neurology

## 2020-08-11 ENCOUNTER — Other Ambulatory Visit: Payer: Self-pay

## 2020-08-11 VITALS — BP 141/103 | HR 68 | Resp 20 | Ht 72.0 in | Wt 180.0 lb

## 2020-08-11 DIAGNOSIS — G20 Parkinson's disease: Secondary | ICD-10-CM

## 2020-08-11 MED ORDER — CARBIDOPA-LEVODOPA 25-100 MG PO TABS: 1.0000 | ORAL_TABLET | Freq: Three times a day (TID) | ORAL | 1 refills | Status: DC

## 2020-08-11 NOTE — Patient Instructions (Signed)
Start Carbidopa Levodopa as follows:  Take 1/2 tablet three times daily, at least 30 minutes before meals (approximately 8am/noon/4pm), for one week  Then take 1/2 tablet in the morning, 1/2 tablet in the afternoon, 1 tablet in the evening, at least 30 minutes before meals, for one week  Then take 1/2 tablet in the morning, 1 tablet in the afternoon, 1 tablet in the evening, at least 30 minutes before meals, for one week  Then take 1 tablet three times daily at 8am/noon/4pm, at least 30 minutes before meals   As a reminder, carbidopa/levodopa can be taken at the same time as a carbohydrate, but we like to have you take your pill either 30 minutes before a protein source or 1 hour after as protein can interfere with carbidopa/levodopa absorption.  Parkinsons [employer]   . Local [place] Online Groups  o Power over [employer] Group :   - Power Over Parkinson's Patient Education Group will be Wednesday, November 10th at 2pm via [place].   - Upcoming Power over Parkinson's Meetings:  2nd Wednesdays of the month at 2 pm:       December 8th, January 12th - Amy Marriott, PT at [facility] has resumed the lead of this group starting in July.  Contact Amy at [email or web address] if interested in participating in this online group o Parkinson's Care Partners Group:    3rd Mondays, Contact Corwin Levins o Atypical Parkinsonian Patient Group:   4th Wednesdays, Contact Corwin Levins o If you are interested in participating in these online groups with Judson Roch, please contact her directly for how to join those meetings.  Her contact information is [email or web address].  She will send you a link to join the [employer].  (Please note that Corwin Levins , MSW, LCSW, has resigned her position at [facility], but will continue to lead the online groups temporarily) .  Marland Kitchen [place]:  [email or web address] o PD Health at Home continues:   Mindfulness Mondays, Expert Briefing Tuesdays, Wellness Wednesdays, Take Time Thursdays, Fitness Fridays  o Upcoming Webinar:  The Skinny on Skin and Bone Health in Parkinson's.  Wednesday, December 1st at 1 pm o Please check out their website to sign up for emails and see their full online offerings .  Marland Kitchen [place]:  [email or web address]  o Upcoming Webinar:   Steps Closer to Stopping Parkinson's:  2021 Research Review, Thursday, November 18th at 12 noon. o Check out additional information on their website to see their full online offerings .  Marland Kitchen [place]:  [email or web address] o Upcoming Webinar:  Non-Motor Symptom Medications in Parkinson's.  Wednesday, November 10th at 2 pm. o Care Partner Monthly Meetup.  With Robin Searing [place].  First Tuesday of each month, 2 pm o Check out additional information to Live Well Today on their website .  Marland Kitchen Parkinson and Movement Disorders (PMD) Alliance:  www.pmdalliance.org o [employer] Online:  Online Education Events o Sign up for emails, which are sent weekly to give you updates on programming and online offerings .  Marland Kitchen Parkinson's Association of the [place]:  www.[email or web address] o Information on online support groups, online exercises including Yoga, Parkinson's exercises and more-LOTS of information on links to PD resources and online events o Virtual Support Group through Parkinson's Association of the [place]; next one is scheduled for Wednesday, August 27, 2020 at 2 pm. (These are typically scheduled for the 1st Wednesday of the month at 2 pm).  Visit website for details. .  . Additional  links for movement activities: o PWR! Moves Classes at [place] RESUMED, at a limited capacity.  We have several openings for Wednesday 10 am and 11 am classes.  Contact Amy Marriott, PT [email or web address] or [phone] if interested o Here is a link to the PWR!Moves classes  on Zoom from [place] - Daily Mon-Sat at 10:00. Via Zoom, FREE and open to all.  There is also a link below via Facebook if you use that platform. - [email or web address].si - [email or web address]://www.[email or web address].no o Parkinson's Wellness Recovery (PWR! Moves)  www.pwr4life.org - Info on the PWR! Virtual Experience:  You will have access to our expertise through self-assessment, guided plans that start with the PD-specific fundamentals, educational content, tips, Q&A with an expert, and a growing Art therapist of PD-specific pre-recorded and live exercise classes of varying types and intensity - both physical and cognitive! If that is not enough, we offer 1:1 wellness consultations (in-person or virtual) to personalize your PWR! Research scientist (medical).  - Check out the PWR! Move of the month on the [place] [facility] website:  [email or web address].[email or web address] o [employer] Fridays:  - As part of the PD Health @ Home program, this free video series focuses each week on one aspect of fitness designed to support people living with Parkinson's.  -  HollywoodSale.dk o Dance for PD website is offering free, live-stream classes throughout the week, as well as links to [employer] of classes:  https://danceforparkinsons.[email or web address]/ o Transport planner for Parkinson's Class:  [place] is back this Fall!  Free offering for people with Parkinson's and care partners; virtual class this Fall. The class will be Wednesdays 4-5pm beginning 10/13.  Classes will run for 9 weeks 10/13-12/15, with no class on 11/24.  Register  below: o https://app.[email or web address]/danceprojectinc/portal.sd?page=Enroll&meth=search&SEASON=Parkinsons+Dance-Fall+2021  o For more information, contact [phone] or email Ruffin Frederick at [email or web address] o Virtual dance and Pilates for Parkinson's classes: Click on the Community Tab> Parkinson's Movement Initiative Tab.  To register for classes and for more information, visit www.[email or web address].co.za and click the "community" tab.  o [employer] Parkinson's Cycling Classes  - [facility] [employer]: 1pm on Fridays-Live classes at [facility] [employer] at [email or web address] or [phone]) [facility] [employer]: Virtual Classes Mondays and Thursdays (contact [place] at [email or web address] or [phone]) .  o [employer] - Three levels of classes are offered Tuesdays and Thursdays:  10:30 am,  12 noon & 1:45 pm at [employer]. To observe a class or for  more information, call [phone] or email [email or web address] . Well[employer]Spring [employer]: o Chief Technology Officer Opportunities:  www.well-springsolutions.org/caregiver-education/caregiver-support-group.  You may also contact Vickki Muff at [email or web address] or [phone].   o Caregiver Virtual Event:   Well[employer]Spring is Partnering with [employer], on Friday, November 19th from 11:30-12:30 for a virtual event - Contact 10-08-1998 (above) for details o Well-Spring Navigator:  Just1Navigator program, a free service to help individuals and families through the journey of determining care for older adults.  The "Navigator" is a Vickki Muff, Education officer, museum, who will speak with a prospective client and/or loved ones to provide an assessment of the situation and a set of recommendations for a personalized care plan -- all free of charge, and whether [facility] [employer] offers the needed service or not. If the need is not a service we provide, we are  well-connected with reputable programs in town that we can refer you to.  www.well-springsolutions.org or to speak with the Navigator, call [phone].  The physicians and staff at [facility] are committed to providing excellent care. You may receive a survey requesting feedback about your experience at our office. We strive to receive "very good" responses to the survey questions. If you feel that your experience would prevent you from giving the office a "very good " response, please contact our office to try to remedy the situation. We may be reached at [phone]. Thank you for taking the time out of your busy day to complete the survey.

## 2020-08-22 DIAGNOSIS — R319 Hematuria, unspecified: Secondary | ICD-10-CM | POA: Diagnosis not present

## 2020-08-25 ENCOUNTER — Telehealth: Payer: Self-pay | Admitting: Neurology

## 2020-08-25 NOTE — Telephone Encounter (Signed)
Pt called and informed that the blood in his urine is unrelated to started the carbidopa/levodopa, pt stated that he went to the [facility] walk in clinic on Friday and called his urologist this morning and waiting on a return call from them

## 2020-08-25 NOTE — Telephone Encounter (Signed)
Blood in his urine is completely unrelated to starting the carbidopa/levodopa but it is serious.  He should contact his PCP today.

## 2020-08-26 DIAGNOSIS — R319 Hematuria, unspecified: Secondary | ICD-10-CM | POA: Diagnosis not present

## 2020-09-01 DIAGNOSIS — Z961 Presence of intraocular lens: Secondary | ICD-10-CM | POA: Diagnosis not present

## 2020-09-01 DIAGNOSIS — H25811 Combined forms of age-related cataract, right eye: Secondary | ICD-10-CM | POA: Diagnosis not present

## 2020-09-01 DIAGNOSIS — C44619 Basal cell carcinoma of skin of left upper limb, including shoulder: Secondary | ICD-10-CM | POA: Diagnosis not present

## 2020-09-01 DIAGNOSIS — H04123 Dry eye syndrome of bilateral lacrimal glands: Secondary | ICD-10-CM | POA: Diagnosis not present

## 2020-09-01 DIAGNOSIS — H40031 Anatomical narrow angle, right eye: Secondary | ICD-10-CM | POA: Diagnosis not present

## 2020-09-01 DIAGNOSIS — D3132 Benign neoplasm of left choroid: Secondary | ICD-10-CM | POA: Diagnosis not present

## 2020-10-01 DIAGNOSIS — C44619 Basal cell carcinoma of skin of left upper limb, including shoulder: Secondary | ICD-10-CM | POA: Diagnosis not present

## 2020-10-02 DIAGNOSIS — H9190 Unspecified hearing loss, unspecified ear: Secondary | ICD-10-CM | POA: Diagnosis not present

## 2020-10-02 DIAGNOSIS — R413 Other amnesia: Secondary | ICD-10-CM | POA: Diagnosis not present

## 2020-10-02 DIAGNOSIS — Z0000 Encounter for general adult medical examination without abnormal findings: Secondary | ICD-10-CM | POA: Diagnosis not present

## 2020-10-17 ENCOUNTER — Other Ambulatory Visit: Payer: Self-pay

## 2020-10-17 ENCOUNTER — Ambulatory Visit (HOSPITAL_COMMUNITY)
Admission: EM | Admit: 2020-10-17 | Discharge: 2020-10-17 | Discharge status: Against Medical Advice | Payer: Medicare PPO

## 2020-10-20 DIAGNOSIS — H6121 Impacted cerumen, right ear: Secondary | ICD-10-CM | POA: Diagnosis not present

## 2020-10-31 DIAGNOSIS — M79674 Pain in right toe(s): Secondary | ICD-10-CM | POA: Diagnosis not present

## 2020-11-19 ENCOUNTER — Other Ambulatory Visit: Payer: Self-pay

## 2020-11-19 ENCOUNTER — Inpatient Hospital Stay: Payer: Medicare PPO | Admitting: Oncology

## 2020-11-19 ENCOUNTER — Telehealth: Payer: Self-pay | Admitting: Oncology

## 2020-11-19 ENCOUNTER — Inpatient Hospital Stay: Payer: Medicare PPO | Attending: Oncology

## 2020-11-19 VITALS — BP 130/64 | HR 78 | Temp 97.0°F | Resp 16 | Ht 72.0 in | Wt 175.8 lb

## 2020-11-19 DIAGNOSIS — C679 Malignant neoplasm of bladder, unspecified: Secondary | ICD-10-CM

## 2020-11-19 DIAGNOSIS — N289 Disorder of kidney and ureter, unspecified: Secondary | ICD-10-CM | POA: Insufficient documentation

## 2020-11-19 DIAGNOSIS — G20 Parkinson's disease: Secondary | ICD-10-CM | POA: Diagnosis not present

## 2020-11-19 DIAGNOSIS — Z8551 Personal history of malignant neoplasm of bladder: Secondary | ICD-10-CM | POA: Insufficient documentation

## 2020-11-19 DIAGNOSIS — Z923 Personal history of irradiation: Secondary | ICD-10-CM | POA: Insufficient documentation

## 2020-11-19 DIAGNOSIS — Z79899 Other long term (current) drug therapy: Secondary | ICD-10-CM | POA: Insufficient documentation

## 2020-11-19 LAB — CBC WITH DIFFERENTIAL (CANCER CENTER ONLY)
Abs Immature Granulocytes: 0.04 10*3/uL (ref 0.00–0.07)
Basophils Absolute: 0.1 10*3/uL (ref 0.0–0.1)
Basophils Relative: 1 %
Eosinophils Absolute: 0.2 10*3/uL (ref 0.0–0.5)
Eosinophils Relative: 3 %
HCT: 42.1 % (ref 39.0–52.0)
Hemoglobin: 13.4 g/dL (ref 13.0–17.0)
Immature Granulocytes: 1 %
Lymphocytes Relative: 19 %
Lymphs Abs: 1.2 10*3/uL (ref 0.7–4.0)
MCH: 30.2 pg (ref 26.0–34.0)
MCHC: 31.8 g/dL (ref 30.0–36.0)
MCV: 95 fL (ref 80.0–100.0)
Monocytes Absolute: 0.5 10*3/uL (ref 0.1–1.0)
Monocytes Relative: 8 %
Neutro Abs: 4.1 10*3/uL (ref 1.7–7.7)
Neutrophils Relative %: 68 %
Platelet Count: 225 10*3/uL (ref 150–400)
RBC: 4.43 MIL/uL (ref 4.22–5.81)
RDW: 12.5 % (ref 11.5–15.5)
WBC Count: 6.1 10*3/uL (ref 4.0–10.5)
nRBC: 0 % (ref 0.0–0.2)

## 2020-11-19 LAB — CMP (CANCER CENTER ONLY)
ALT: 7 U/L (ref 0–44)
AST: 13 U/L — ABNORMAL LOW (ref 15–41)
Albumin: 4 g/dL (ref 3.5–5.0)
Alkaline Phosphatase: 58 U/L (ref 38–126)
Anion gap: 4 — ABNORMAL LOW (ref 5–15)
BUN: 14 mg/dL (ref 8–23)
CO2: 29 mmol/L (ref 22–32)
Calcium: 9.2 mg/dL (ref 8.9–10.3)
Chloride: 107 mmol/L (ref 98–111)
Creatinine: 1.42 mg/dL — ABNORMAL HIGH (ref 0.61–1.24)
GFR, Estimated: 49 mL/min — ABNORMAL LOW (ref >60)
Glucose, Bld: 104 mg/dL — ABNORMAL HIGH (ref 70–99)
Potassium: 5.4 mmol/L — ABNORMAL HIGH (ref 3.5–5.1)
Sodium: 140 mmol/L (ref 135–145)
Total Bilirubin: 0.6 mg/dL (ref 0.3–1.2)
Total Protein: 7 g/dL (ref 6.5–8.1)

## 2020-11-19 NOTE — Telephone Encounter (Signed)
Scheduled follow-up appointments per 2/23 los. Patient is aware. 

## 2020-11-19 NOTE — Progress Notes (Signed)
Hematology and Oncology Follow Up Visit  [name] [id number] [birth year] 85 y.o. 11/19/2020 10:14 AM [name], [name], MDMitchell, [name], MD   Principle Diagnosis: 85 year old with T2N0 urothelial carcinoma of the bladder diagnosed in 2019.    Prior Therapy:  TURBT on July 18, 2018.  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  He was found to have T2 lesion with grade 3 histology.   Radiation therapy with weekly carboplatin completed and January 2020.  Current therapy: Observation and surveillance.  Interim History: [name] returns today for a follow-up visit.  Since the last visit, he reports no major changes in his health.  He was diagnosed with Parkinson's disease and currently on Sinemet.  He does report some tremors on the left side of upper extremity but no rigidity or falls.  Still able to attend to activities of daily living without any decline.  Denies any frequency or urgency.  He denies any hematuria or dysuria.      Medications: Reviewed without changes. Current Outpatient Medications  Medication Sig Dispense Refill  . carbidopa-levodopa (SINEMET IR) 25-100 MG tablet Take 1 tablet by mouth 3 (three) times daily. 8am/noon/4pm 270 tablet 1  . EUTHYROX 112 MCG tablet     . levothyroxine (SYNTHROID, LEVOTHROID) 100 MCG tablet Take 100 mcg by mouth daily.  3   No current facility-administered medications for this visit.     Allergies: No Known Allergies     Physical Exam:   Blood pressure 130/64, pulse 78, temperature (!) 97 F (36.1 C), temperature source Tympanic, resp. rate 16, height 6' (1.829 m), weight 175 lb 12.8 oz (79.7 kg), SpO2 100 %.    ECOG: 1   General appearance: Alert, awake without any distress. Head: Atraumatic without abnormalities Oropharynx: Without any thrush or ulcers. Eyes: No scleral icterus. Lymph nodes: No lymphadenopathy noted in  the cervical, supraclavicular, or axillary nodes Heart:regular rate and rhythm, without any murmurs or gallops.   Lung: Clear to auscultation without any rhonchi, wheezes or dullness to percussion. Abdomin: Soft, nontender without any shifting dullness or ascites. Musculoskeletal: No clubbing or cyanosis. Neurological: No motor or sensory deficits. Skin: No rashes or lesions.            Lab Results: Lab Results  Component Value Date   WBC 6.6 05/20/2020   HGB 13.5 05/20/2020   HCT 41.8 05/20/2020   MCV 92.7 05/20/2020   PLT 226 05/20/2020     Chemistry      Component Value Date/Time   NA 142 05/20/2020 0754   K 4.8 05/20/2020 0754   CL 107 05/20/2020 0754   CO2 28 05/20/2020 0754   BUN 14 05/20/2020 0754   CREATININE 1.46 (H) 05/20/2020 0754      Component Value Date/Time   CALCIUM 10.2 05/20/2020 0754   ALKPHOS 60 05/20/2020 0754   AST 14 (L) 05/20/2020 0754   ALT 7 05/20/2020 0754   BILITOT 0.6 05/20/2020 0754       Impression and Plan:  85 year old man with:  1.    Bladder cancer diagnosed in October 2019.  He was found to have T2N0 high-grade urothelial carcinoma.   He is currently on active surveillance without any evidence of disease relapse at this time.  Imaging studies in August 2021 did not show any evidence of relapse.  Risk of relapse was discussed today and treatment options upon relapse were reviewed.  These include immunotherapy, oral targeted therapy  and antibiotic drug conjugate options.   For the time being, I recommended continued active surveillance we will repeat imaging studies in 6 months.  He is agreeable to proceed at this time.  2.    Renal insufficiency: Creatinine clearance remains close to 50 cc/min and will continue to monitor.  3.  Follow-up: He will return in 6 months for repeat follow-up and repeat imaging studies.  30  minutes were spent on this visit.  The time was dedicated to updating his disease status, discussing  treatment options and future plan of care review.    Zola Button, MD 2/23/202210:14 AM

## 2020-12-08 NOTE — Progress Notes (Signed)
Assessment/Plan:   1.  Parkinsons Disease, diagnosed November, 2021  -Continue carbidopa/levodopa 25/100, 1 tablet 3 times per day.  -He is following regularly with dermatology for previously diagnosed skin cancer.  He understands that Parkinson's disease slightly increases risk for melanoma.   Subjective:   Anthony Pacheco was seen today in follow up for newly diagnosed Parkinsons disease.  My previous records were reviewed prior to todays visit as well as outside records available to me. Pt denies falls.  Pt denies lightheadedness, near syncope.  No hallucinations.  Mood has been good.  Current prescribed movement disorder medications: Carbidopa/levodopa 25/100, 1 tablet 3 times daily (started last visit) - sometimes forgets the last one   PREVIOUS MEDICATIONS: Sinemet  ALLERGIES:  No Known Allergies  CURRENT MEDICATIONS:  Outpatient Encounter Medications as of 12/11/2020  Medication Sig  . carbidopa-levodopa (SINEMET IR) 25-100 MG tablet Take 1 tablet by mouth 3 (three) times daily. 8am/noon/4pm  . levothyroxine (SYNTHROID, LEVOTHROID) 100 MCG tablet Take 100 mcg by mouth daily.  Arna Medici 112 MCG tablet  (Patient not taking: Reported on 12/11/2020)   No facility-administered encounter medications on file as of 12/11/2020.    Objective:   PHYSICAL EXAMINATION:    VITALS:   Vitals:   12/11/20 1022  BP: (!) 154/74  Pulse: 94  SpO2: 96%  Weight: 173 lb (78.5 kg)    GEN:  The patient appears stated age and is in NAD. HEENT:  Normocephalic, atraumatic.  The mucous membranes are moist. The superficial temporal arteries are without ropiness or tenderness. CV:  RRR Lungs:  CTAB Neck/HEME:  There are no carotid bruits bilaterally.  Neurological examination:  Orientation: The patient is alert and oriented x3. Cranial nerves: There is good facial symmetry with min facial hypomimia. The speech is fluent and clear. Soft palate rises symmetrically and there is no tongue  deviation. Hearing is intact to conversational tone. Sensation: Sensation is intact to light touch throughout Motor: Strength is at least antigravity x4.  Movement examination: Tone: There is nl tone in the UE/LE Abnormal movements: there is LUE rest tremor that increases with distraction.   Coordination:  There is no decremation Gait and Station: The patient has no difficulty arising out of a deep-seated chair without the use of the hands. The patient's stride length is good with re-emergent tremor on the L  I have reviewed and interpreted the following labs independently    Chemistry      Component Value Date/Time   NA 140 11/19/2020 1011   K 5.4 (H) 11/19/2020 1011   CL 107 11/19/2020 1011   CO2 29 11/19/2020 1011   BUN 14 11/19/2020 1011   CREATININE 1.42 (H) 11/19/2020 1011      Component Value Date/Time   CALCIUM 9.2 11/19/2020 1011   ALKPHOS 58 11/19/2020 1011   AST 13 (L) 11/19/2020 1011   ALT 7 11/19/2020 1011   BILITOT 0.6 11/19/2020 1011       Lab Results  Component Value Date   WBC 6.1 11/19/2020   HGB 13.4 11/19/2020   HCT 42.1 11/19/2020   MCV 95.0 11/19/2020   PLT 225 11/19/2020    No results found for: TSH   Total time spent on today's visit was 20 minutes, including both face-to-face time and nonface-to-face time.  Time included that spent on review of records (prior notes available to me/labs/imaging if pertinent), discussing treatment and goals, answering patient's questions and coordinating care.  Cc:  [name], [name], MD

## 2020-12-11 ENCOUNTER — Other Ambulatory Visit: Payer: Self-pay

## 2020-12-11 ENCOUNTER — Ambulatory Visit: Payer: Medicare PPO | Admitting: Neurology

## 2020-12-11 ENCOUNTER — Encounter: Payer: Self-pay | Admitting: Neurology

## 2020-12-11 VITALS — BP 154/74 | HR 94 | Wt 173.0 lb

## 2020-12-11 DIAGNOSIS — G20 Parkinson's disease: Secondary | ICD-10-CM

## 2020-12-11 MED ORDER — CARBIDOPA-LEVODOPA 25-100 MG PO TABS: 1.0000 | ORAL_TABLET | Freq: Three times a day (TID) | ORAL | 1 refills | Status: DC

## 2020-12-11 NOTE — Patient Instructions (Signed)
Online Resources for Power over Parkinson's Group March 2022  . Local [place] Online Groups  o Power over Parkinson's Group :   - Power Over Parkinson's Patient Education Group will be Wednesday, March 9th at 2pm via Zoom.   - Upcoming Power over Parkinson's Meetings:  2nd Wednesdays of the month at 2 pm:       April 13th, May 11th - Contact Amy Marriott at [email or web address] if interested in participating in this online group o Parkinson's Care Partners Group:    3rd Mondays, Contact Sarah Chambers o Atypical Parkinsonian Patient Group:   4th Wednesdays, Contact Sarah Chambers o If you are interested in participating in these online groups with Sarah, please contact her directly for how to join those meetings.  Her contact information is [email or web address].  She will send you a link to join the Zoom meeting.  (Please note that Sarah Chambers , MSW, LCSW, has resigned her position at [employer], but will continue to lead the online groups temporarily)  . Parkinson Foundation:  www.parkinson.org o PD Health at Home continues:  Mindfulness Mondays, Expert Briefing Tuesdays, Wellness Wednesdays, Take Time Thursdays, Fitness Fridays -Listings for March 2022 are on the website o Upcoming Webinar:  Conversations about Complementary Therapies and PD.  Wednesday, March 2nd @ 1 pm o Upcoming Webinar:  Can We Put the Brakes on PD Progression?  Wednesday, April 6th @ 1 pm o Register for expert briefings (webinars) at [email or web address] o  Please check out their website to sign up for emails and see their full online offerings  . [employer] Foundation:  www.michaeljfox.org  o Upcoming Webinar:   Trouble Sleeping?  What to Know about Acting out Dreams and other Sleep Issues.  Thursday, March 17th @ 12 noon o Check out additional information on their website to see their full online offerings  . [place] Phinney Foundation:  www.davisphinneyfoundation.org o Upcoming  Webinar:  Women and Parkinson's.  Tuesday, March 8th at 2 pm o Care Partner Monthly Meetup.  With Connie Carpenter Phinney.  First Tuesday of each month, 2 pm o Check out additional information to Live Well Today on their website  . Parkinson and Movement Disorders (PMD) Alliance:  www.pmdalliance.org o [employer]:  Online Education Events o Sign up for emails, which are sent weekly to give you updates on programming and online offerings   . Parkinson's Association of [place]:  www.parkinsonassociation.org o Information on online support groups, education events, and online exercises including Yoga, Parkinson's exercises and more-LOTS of information on links to PD resources and online events o Virtual Support Group through Parkinson's Association of [place]; next one is scheduled for Wednesday, December 31, 2020 at 2 pm. (These are typically scheduled for the 1st Wednesday of the month at 2 pm).  Visit website for details.  . Additional links for movement activities: o PWR! Moves Classes at [facility] HAVE RESUMED!  Wednesdays 10 and 11 am.  Contact Amy Marriott, PT [email or web address] or [phone] if interested o Here is a link to the PWR!Moves classes on Zoom from [place] Parkinson's Foundation - Daily Mon-Sat at 10:00. Via Zoom, FREE and open to all.  There is also a link below via Facebook if you use that platform. - https://www.parkinsonsmi.org/mpf-programs/exercise-and-movement-activities - https://www.facebook.com/ParkinsonsMI.org/posts/pwr-moves-exercise-class-parkinson-wellness-recovery-online-with-angee-ludwa-pt-/10156827878021813/ o Parkinson's Wellness Recovery (PWR! Moves)  www.pwr4life.org - Info on the PWR! Virtual Experience:  You will have access to our expertise through self-assessment, guided plans that start with the PD-specific fundamentals, educational content, tips, Q&A with an expert,   and a growing library of PD-specific  pre-recorded and live exercise classes of varying types and intensity - both physical and cognitive! If that is not enough, we offer 1:1 wellness consultations (in-person or virtual) to personalize your PWR! Virtual Experience.  - Check out the PWR! Move of the month on the Parkinson Wellness Recovery website:  https://www.pwr4life.org/pwr-move-of-the-month-4/ o Parkinson Foundation Fitness Fridays:  - As part of the PD Health @ Home program, this free video series focuses each week on one aspect of fitness designed to support people living with Parkinson's.  These weekly videos highlight the Parkinson Foundation recent fitness guidelines for people with Parkinson's disease. -  www.parkinson.org/understanding-parkinsons/coronavirus/PD-health-at-home/Fitness-Fridays o Dance for PD website is offering free, live-stream classes throughout the week, as well as links to digital library of classes:  https://danceforparkinsons.org/ o Dance for Parkinson's Class:  [employer] [place].  Free offering for people with Parkinson's and care partners; virtual class.  o For more information, contact [phone] or email Magalli Morana at [email or web address] o Virtual dance and Pilates for Parkinson's classes: Click on the Community Tab> Parkinson's Movement Initiative Tab.  To register for classes and for more information, visit www.americandancefestival.org and click the "community" tab.     o [employer] Parkinson's [facility]ing Classes  - [employer]: 1pm on Fridays-Live classes at [employer] (Contact Beth McKinney at [email or web address] or [phone]) - [facility]: Virtual Classes Mondays and Thursdays (contact Priscilla at [email or web address] or [phone])   o  [employer] - Three levels of classes are offered Tuesdays and Thursdays:  10:30 am,  12 noon & 1:45 pm at [employer] [facility].  - Active Stretching with Maria, New Class starting in  March, on Fridays - To observe a class or for  more information, call [phone] or email [email or web address] . [facility]: o Online Caregiver Education Opportunities:  www.well-springsolutions.org/caregiver-education/caregiver-support-group.  You may also contact Jodi Kolada at [email or web address] or [phone].   o Virtual Caregiver Retreat, Monday, February 21st, 3-5 pm o Powerful Tools for Caregivers, 6-wk series for caregivers, in-person, Thursdays March 10, 17, 24, 31 and April 7, 14, from 10:30-12:30 at [facility] Conference Room, [address]  Contact Jodi Kolada (see above) to register o [facility] Navigator:  Just1Navigator program, a free service to help individuals and families through the journey of determining care for older adults.  The "Navigator" is a social worker, Nicole Reynolds, who will speak with a prospective client and/or loved ones to provide an assessment of the situation and a set of recommendations for a personalized care plan -- all free of charge, and whether [facility] offers the needed service or not. If the need is not a service we provide, we are well-connected with reputable programs in town that we can refer you to.  www.well-springsolutions.org or to speak with the Navigator, call [phone].  

## 2021-01-15 DIAGNOSIS — Z8551 Personal history of malignant neoplasm of bladder: Secondary | ICD-10-CM | POA: Diagnosis not present

## 2021-01-15 DIAGNOSIS — Z08 Encounter for follow-up examination after completed treatment for malignant neoplasm: Secondary | ICD-10-CM | POA: Diagnosis not present

## 2021-04-17 DIAGNOSIS — R634 Abnormal weight loss: Secondary | ICD-10-CM | POA: Diagnosis not present

## 2021-05-19 ENCOUNTER — Other Ambulatory Visit: Payer: Medicare PPO

## 2021-05-25 ENCOUNTER — Ambulatory Visit (HOSPITAL_COMMUNITY)
Admission: RE | Admit: 2021-05-25 | Discharge: 2021-05-25 | Disposition: A | Discharge status: Home / Self Care | Payer: Medicare PPO | Source: Ambulatory Visit | Attending: Oncology | Admitting: Oncology

## 2021-05-25 ENCOUNTER — Inpatient Hospital Stay: Payer: Medicare PPO | Attending: Oncology

## 2021-05-25 ENCOUNTER — Other Ambulatory Visit: Payer: Self-pay

## 2021-05-25 DIAGNOSIS — Z923 Personal history of irradiation: Secondary | ICD-10-CM | POA: Diagnosis not present

## 2021-05-25 DIAGNOSIS — C679 Malignant neoplasm of bladder, unspecified: Secondary | ICD-10-CM | POA: Diagnosis not present

## 2021-05-25 DIAGNOSIS — N289 Disorder of kidney and ureter, unspecified: Secondary | ICD-10-CM | POA: Insufficient documentation

## 2021-05-25 DIAGNOSIS — I700 Atherosclerosis of aorta: Secondary | ICD-10-CM | POA: Diagnosis not present

## 2021-05-25 DIAGNOSIS — Z8551 Personal history of malignant neoplasm of bladder: Secondary | ICD-10-CM | POA: Diagnosis not present

## 2021-05-25 DIAGNOSIS — Z79899 Other long term (current) drug therapy: Secondary | ICD-10-CM | POA: Insufficient documentation

## 2021-05-25 DIAGNOSIS — K5730 Diverticulosis of large intestine without perforation or abscess without bleeding: Secondary | ICD-10-CM | POA: Diagnosis not present

## 2021-05-25 DIAGNOSIS — K8020 Calculus of gallbladder without cholecystitis without obstruction: Secondary | ICD-10-CM | POA: Diagnosis not present

## 2021-05-25 LAB — CBC WITH DIFFERENTIAL (CANCER CENTER ONLY)
Abs Immature Granulocytes: 0.03 10*3/uL (ref 0.00–0.07)
Basophils Absolute: 0.1 10*3/uL (ref 0.0–0.1)
Basophils Relative: 1 %
Eosinophils Absolute: 0.1 10*3/uL (ref 0.0–0.5)
Eosinophils Relative: 1 %
HCT: 39.7 % (ref 39.0–52.0)
Hemoglobin: 12.8 g/dL — ABNORMAL LOW (ref 13.0–17.0)
Immature Granulocytes: 0 %
Lymphocytes Relative: 21 %
Lymphs Abs: 1.5 10*3/uL (ref 0.7–4.0)
MCH: 30.1 pg (ref 26.0–34.0)
MCHC: 32.2 g/dL (ref 30.0–36.0)
MCV: 93.4 fL (ref 80.0–100.0)
Monocytes Absolute: 0.6 10*3/uL (ref 0.1–1.0)
Monocytes Relative: 8 %
Neutro Abs: 5.1 10*3/uL (ref 1.7–7.7)
Neutrophils Relative %: 69 %
Platelet Count: 250 10*3/uL (ref 150–400)
RBC: 4.25 MIL/uL (ref 4.22–5.81)
RDW: 12.7 % (ref 11.5–15.5)
WBC Count: 7.3 10*3/uL (ref 4.0–10.5)
nRBC: 0 % (ref 0.0–0.2)

## 2021-05-25 LAB — CMP (CANCER CENTER ONLY)
ALT: <6 U/L (ref 0–44)
AST: 12 U/L — ABNORMAL LOW (ref 15–41)
Albumin: 3.9 g/dL (ref 3.5–5.0)
Alkaline Phosphatase: 59 U/L (ref 38–126)
Anion gap: 10 (ref 5–15)
BUN: 16 mg/dL (ref 8–23)
CO2: 27 mmol/L (ref 22–32)
Calcium: 9.4 mg/dL (ref 8.9–10.3)
Chloride: 106 mmol/L (ref 98–111)
Creatinine: 1.41 mg/dL — ABNORMAL HIGH (ref 0.61–1.24)
GFR, Estimated: 50 mL/min — ABNORMAL LOW (ref >60)
Glucose, Bld: 89 mg/dL (ref 70–99)
Potassium: 4.3 mmol/L (ref 3.5–5.1)
Sodium: 143 mmol/L (ref 135–145)
Total Bilirubin: 0.6 mg/dL (ref 0.3–1.2)
Total Protein: 7.2 g/dL (ref 6.5–8.1)

## 2021-05-26 ENCOUNTER — Telehealth: Payer: Self-pay | Admitting: *Deleted

## 2021-05-26 ENCOUNTER — Inpatient Hospital Stay: Payer: Medicare PPO | Admitting: Oncology

## 2021-05-26 VITALS — BP 132/89 | HR 89 | Temp 97.7°F | Resp 18 | Ht 72.0 in | Wt 165.7 lb

## 2021-05-26 DIAGNOSIS — Z923 Personal history of irradiation: Secondary | ICD-10-CM | POA: Diagnosis not present

## 2021-05-26 DIAGNOSIS — Z8551 Personal history of malignant neoplasm of bladder: Secondary | ICD-10-CM | POA: Diagnosis not present

## 2021-05-26 DIAGNOSIS — C679 Malignant neoplasm of bladder, unspecified: Secondary | ICD-10-CM | POA: Diagnosis not present

## 2021-05-26 DIAGNOSIS — Z79899 Other long term (current) drug therapy: Secondary | ICD-10-CM | POA: Diagnosis not present

## 2021-05-26 DIAGNOSIS — N289 Disorder of kidney and ureter, unspecified: Secondary | ICD-10-CM | POA: Diagnosis not present

## 2021-05-26 NOTE — Telephone Encounter (Signed)
Patient did not answer - LVM office will call tomorrow.  Will call patient tomorrow with results

## 2021-05-26 NOTE — Progress Notes (Signed)
Hematology and Oncology Follow Up Visit  Anthony Pacheco [id number] [birth year] 85 y.o. 05/26/2021 8:54 AM [name], [name], MDMitchell, [name], MD   Principle Diagnosis: 85 year old man with bladder cancer diagnosed in 2019.  He was found to have T2N0 urothelial carcinoma.  Prior Therapy:  TURBT on July 18, 2018.  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  He was found to have T2 lesion with grade 3 histology.   Radiation therapy with weekly carboplatin completed and January 2020.  Current therapy: Active surveillance.  Interim History: Anthony Pacheco is here for repeat evaluation.  Since the last visit, he reports feeling well without any major complaints.  He denies any nausea, vomiting or abdominal pain.  He denies any frequency urgency or hesitancy.  He denies any hematuria or dysuria.      Medications: Updated on review. Current Outpatient Medications  Medication Sig Dispense Refill   carbidopa-levodopa (SINEMET IR) 25-100 MG tablet Take 1 tablet by mouth 3 (three) times daily. 8am/noon/4pm 270 tablet 1   EUTHYROX 112 MCG tablet  (Patient not taking: Reported on 12/11/2020)     levothyroxine (SYNTHROID, LEVOTHROID) 100 MCG tablet Take 100 mcg by mouth daily.  3   No current facility-administered medications for this visit.     Allergies: No Known Allergies     Physical Exam:   Blood pressure 132/89, pulse 89, temperature 97.7 F (36.5 C), temperature source Tympanic, resp. rate 18, height 6' (1.829 m), weight 165 lb 11.2 oz (75.2 kg), SpO2 100 %.  Blood pressure 132/89, pulse 89, temperature 97.7 F (36.5 C), temperature source Tympanic, resp. rate 18, height 6' (1.829 m), weight 165 lb 11.2 oz (75.2 kg), SpO2 100 %.   ECOG: 1    General appearance: Comfortable appearing without any discomfort Head: Normocephalic without any trauma Oropharynx: Mucous membranes are moist and  pink without any thrush or ulcers. Eyes: Pupils are equal and round reactive to light. Lymph nodes: No cervical, supraclavicular, inguinal or axillary lymphadenopathy.   Heart:regular rate and rhythm.  S1 and S2 without leg edema. Lung: Clear without any rhonchi or wheezes.  No dullness to percussion. Abdomin: Soft, nontender, nondistended with good bowel sounds.  No hepatosplenomegaly. Musculoskeletal: No joint deformity or effusion.  Full range of motion noted. Neurological: No deficits noted on motor, sensory and deep tendon reflex exam. Skin: No petechial rash or dryness.  Appeared moist.            Lab Results: Lab Results  Component Value Date   WBC 7.3 05/25/2021   HGB 12.8 (L) 05/25/2021   HCT 39.7 05/25/2021   MCV 93.4 05/25/2021   PLT 250 05/25/2021     Chemistry      Component Value Date/Time   NA 143 05/25/2021 1452   K 4.3 05/25/2021 1452   CL 106 05/25/2021 1452   CO2 27 05/25/2021 1452   BUN 16 05/25/2021 1452   CREATININE 1.41 (H) 05/25/2021 1452      Component Value Date/Time   CALCIUM 9.4 05/25/2021 1452   ALKPHOS 59 05/25/2021 1452   AST 12 (L) 05/25/2021 1452   ALT <6 05/25/2021 1452   BILITOT 0.6 05/25/2021 1452       Impression and Plan:  85 year old man with:   1.   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  His disease status was updated at this time and treatment choices were reviewed.  CT scan obtained on  May 25, 2021 currently pending.  Laboratory data reviewed and showed no clear abnormalities.  If his CT scan showed no evidence of relapsed disease, I recommended continued active surveillance.  Liver salvage therapy options such as chemotherapy, immunotherapy among others reviewed only if he has relapsed disease.  We will discontinue imaging studies after 2023.     2.    Renal insufficiency: Kidney function remained stable at this time after platinum based therapy.    3.  Follow-up: In12 months for  repeat imaging studies.  No further imaging studies will be needed after that.   30  minutes were dedicated to this encounter.  The time spent on reviewing laboratory data, disease status update, treatment choices and future plan of care discussion.     Zola Button, MD 8/30/20228:54 AM

## 2021-06-18 DIAGNOSIS — E039 Hypothyroidism, unspecified: Secondary | ICD-10-CM | POA: Diagnosis not present

## 2021-06-18 NOTE — Progress Notes (Deleted)
    Assessment/Plan:   1.  Parkinsons Disease, diagnosed November, 2021  -Continue carbidopa/levodopa 25/100, 1 tablet 3 times per day.  2.  History of bladder cancer  -Doing well.  No history of metastatic disease or recurrent disease.  Subjective:   Anthony Pacheco was seen today in follow up for newly diagnosed Parkinsons disease.  My previous records were reviewed prior to todays visit as well as outside records available to me. Pt denies falls.  Pt denies lightheadedness, near syncope.  No hallucinations.  Mood has been good.  Last saw oncology August 30 for his history of bladder cancer.  Patient had CT done and demonstrated no evidence of recurrent disease.  Current prescribed movement disorder medications: Carbidopa/levodopa 25/100, 1 tablet 3 times daily    PREVIOUS MEDICATIONS: Sinemet  ALLERGIES:  No Known Allergies  CURRENT MEDICATIONS:  Outpatient Encounter Medications as of 06/22/2021  Medication Sig   carbidopa-levodopa (SINEMET IR) 25-100 MG tablet Take 1 tablet by mouth 3 (three) times daily. 8am/noon/4pm   EUTHYROX 112 MCG tablet  (Patient not taking: Reported on 12/11/2020)   levothyroxine (SYNTHROID, LEVOTHROID) 100 MCG tablet Take 100 mcg by mouth daily.   No facility-administered encounter medications on file as of 06/22/2021.    Objective:   PHYSICAL EXAMINATION:    VITALS:   There were no vitals filed for this visit.   GEN:  The patient appears stated age and is in NAD. HEENT:  Normocephalic, atraumatic.  The mucous membranes are moist. The superficial temporal arteries are without ropiness or tenderness. CV:  RRR Lungs:  CTAB Neck/HEME:  There are no carotid bruits bilaterally.  Neurological examination:  Orientation: The patient is alert and oriented x3. Cranial nerves: There is good facial symmetry with min facial hypomimia. The speech is fluent and clear. Soft palate rises symmetrically and there is no tongue deviation. Hearing is intact to  conversational tone. Sensation: Sensation is intact to light touch throughout Motor: Strength is at least antigravity x4.  Movement examination: Tone: There is nl tone in the UE/LE Abnormal movements: there is LUE rest tremor that increases with distraction.   Coordination:  There is no decremation Gait and Station: The patient has no difficulty arising out of a deep-seated chair without the use of the hands. The patient's stride length is good with re-emergent tremor on the L  I have reviewed and interpreted the following labs independently    Chemistry      Component Value Date/Time   NA 143 05/25/2021 1452   K 4.3 05/25/2021 1452   CL 106 05/25/2021 1452   CO2 27 05/25/2021 1452   BUN 16 05/25/2021 1452   CREATININE 1.41 (H) 05/25/2021 1452      Component Value Date/Time   CALCIUM 9.4 05/25/2021 1452   ALKPHOS 59 05/25/2021 1452   AST 12 (L) 05/25/2021 1452   ALT <6 05/25/2021 1452   BILITOT 0.6 05/25/2021 1452       Lab Results  Component Value Date   WBC 7.3 05/25/2021   HGB 12.8 (L) 05/25/2021   HCT 39.7 05/25/2021   MCV 93.4 05/25/2021   PLT 250 05/25/2021    No results found for: TSH   Total time spent on today's visit was *** minutes, including both face-to-face time and nonface-to-face time.  Time included that spent on review of records (prior notes available to me/labs/imaging if pertinent), discussing treatment and goals, answering patient's questions and coordinating care.  Cc:  [name], [name], MD

## 2021-06-22 ENCOUNTER — Ambulatory Visit: Payer: Medicare PPO | Admitting: Neurology

## 2021-07-06 NOTE — Progress Notes (Signed)
Assessment/Plan:   1.  Parkinsons Disease, diagnosed November, 2021  -Pt to take carbidopa/levodopa 25/100, 1 tablet 3 times per day.  Discussed importance of timing of med.  He is now only taking in AM.  He will try to take tid  -We discussed that it used to be thought that levodopa would increase risk of melanoma but now it is believed that Parkinsons itself likely increases risk of melanoma. he is to get regular skin checks.   2.  History of bladder cancer  -Doing well.  No history of metastatic disease or recurrent disease.  Subjective:   Anthony Pacheco was seen today in follow up for newly diagnosed Parkinsons disease.  My previous records were reviewed prior to todays visit as well as outside records available to me. Pt denies falls.  He does not feel worse despite fact that med isn't always taken. Pt denies lightheadedness, near syncope.  No hallucinations.  Mood has been good.  Last saw oncology August 30 for his history of bladder cancer.  Patient had CT done and demonstrated no evidence of recurrent disease.    Current prescribed movement disorder medications: Carbidopa/levodopa 25/100, 1 tablet 3 times daily (admits that he is not taking med at middle of the day and evening - only gets AM dose in usually - misses dosages about 1/3 of the time)   PREVIOUS MEDICATIONS: Sinemet  ALLERGIES:  No Known Allergies  CURRENT MEDICATIONS:  Outpatient Encounter Medications as of 07/07/2021  Medication Sig   carbidopa-levodopa (SINEMET IR) 25-100 MG tablet Take 1 tablet by mouth 3 (three) times daily. 8am/noon/4pm   levothyroxine (SYNTHROID, LEVOTHROID) 100 MCG tablet Take 100 mcg by mouth daily.   [DISCONTINUED] EUTHYROX 112 MCG tablet  (Patient not taking: Reported on 12/11/2020)   No facility-administered encounter medications on file as of 07/07/2021.    Objective:   PHYSICAL EXAMINATION:    VITALS:   Vitals:   07/07/21 0822  BP: 121/62  Pulse: 89  SpO2: 99%   Weight: 172 lb 3.2 oz (78.1 kg)  Height: 5\' 9"  (1.753 m)     GEN:  The patient appears stated age and is in NAD. HEENT:  Normocephalic, atraumatic.  The mucous membranes are moist. The superficial temporal arteries are without ropiness or tenderness. CV:  RRR Lungs:  CTAB Neck/HEME:  There are no carotid bruits bilaterally.  Neurological examination:  Orientation: The patient is alert and oriented x3. Cranial nerves: There is good facial symmetry with min facial hypomimia. The speech is fluent and clear. Soft palate rises symmetrically and there is no tongue deviation. Hearing is intact to conversational tone. Sensation: Sensation is intact to light touch throughout Motor: Strength is at least antigravity x4.  Movement examination: Tone: There is nl tone in the UE/LE Abnormal movements: there is LUE rest tremor that increases with distraction.   Coordination:  There is no decremation Gait and Station: The patient has no difficulty arising out of a deep-seated chair without the use of the hands. The patient's stride length is good with re-emergent tremor on the L  I have reviewed and interpreted the following labs independently    Chemistry      Component Value Date/Time   NA 143 05/25/2021 1452   K 4.3 05/25/2021 1452   CL 106 05/25/2021 1452   CO2 27 05/25/2021 1452   BUN 16 05/25/2021 1452   CREATININE 1.41 (H) 05/25/2021 1452      Component Value Date/Time   CALCIUM 9.4  05/25/2021 1452   ALKPHOS 59 05/25/2021 1452   AST 12 (L) 05/25/2021 1452   ALT <6 05/25/2021 1452   BILITOT 0.6 05/25/2021 1452       Lab Results  Component Value Date   WBC 7.3 05/25/2021   HGB 12.8 (L) 05/25/2021   HCT 39.7 05/25/2021   MCV 93.4 05/25/2021   PLT 250 05/25/2021    No results found for: TSH   Total time spent on today's visit was 20 minutes, including both face-to-face time and nonface-to-face time.  Time included that spent on review of records (prior notes available to  me/labs/imaging if pertinent), discussing treatment and goals, answering patient's questions and coordinating care.  Cc:  [name], [name], MD

## 2021-07-07 ENCOUNTER — Encounter: Payer: Self-pay | Admitting: Neurology

## 2021-07-07 ENCOUNTER — Other Ambulatory Visit: Payer: Self-pay

## 2021-07-07 ENCOUNTER — Ambulatory Visit: Payer: Medicare PPO | Admitting: Neurology

## 2021-07-07 VITALS — BP 121/62 | HR 89 | Ht 69.0 in | Wt 172.2 lb

## 2021-07-07 DIAGNOSIS — G20A1 Parkinson's disease without dyskinesia, without mention of fluctuations: Secondary | ICD-10-CM | POA: Insufficient documentation

## 2021-07-07 DIAGNOSIS — G20 Parkinson's disease: Secondary | ICD-10-CM | POA: Diagnosis not present

## 2021-07-07 NOTE — Patient Instructions (Addendum)
Take your medication at 8am/11am/4pm.    As we discussed, it used to be thought that levodopa would increase risk of melanoma but now it is believed that Parkinsons itself likely increases risk of melanoma. I recommend yearly skin checks with a board certified dermatologist.  You can call [facility] or [facility] for an appointment.  [facility] Address: [address] [place], [place], [place] [address] Phone: [phone]  [facility] [place], [place], [place] Phone: [phone]

## 2021-09-01 DIAGNOSIS — H04123 Dry eye syndrome of bilateral lacrimal glands: Secondary | ICD-10-CM | POA: Diagnosis not present

## 2021-09-01 DIAGNOSIS — H26492 Other secondary cataract, left eye: Secondary | ICD-10-CM | POA: Diagnosis not present

## 2021-09-01 DIAGNOSIS — H25811 Combined forms of age-related cataract, right eye: Secondary | ICD-10-CM | POA: Diagnosis not present

## 2021-09-01 DIAGNOSIS — Z961 Presence of intraocular lens: Secondary | ICD-10-CM | POA: Diagnosis not present

## 2021-09-01 DIAGNOSIS — D3132 Benign neoplasm of left choroid: Secondary | ICD-10-CM | POA: Diagnosis not present

## 2021-09-01 DIAGNOSIS — H40031 Anatomical narrow angle, right eye: Secondary | ICD-10-CM | POA: Diagnosis not present

## 2021-09-02 ENCOUNTER — Other Ambulatory Visit: Payer: Self-pay | Admitting: Neurology

## 2021-10-16 DIAGNOSIS — E039 Hypothyroidism, unspecified: Secondary | ICD-10-CM | POA: Diagnosis not present

## 2021-10-16 DIAGNOSIS — I700 Atherosclerosis of aorta: Secondary | ICD-10-CM | POA: Diagnosis not present

## 2021-10-16 DIAGNOSIS — Z0000 Encounter for general adult medical examination without abnormal findings: Secondary | ICD-10-CM | POA: Diagnosis not present

## 2021-10-16 DIAGNOSIS — H9190 Unspecified hearing loss, unspecified ear: Secondary | ICD-10-CM | POA: Diagnosis not present

## 2021-12-17 DIAGNOSIS — R310 Gross hematuria: Secondary | ICD-10-CM | POA: Diagnosis not present

## 2021-12-17 DIAGNOSIS — Z8551 Personal history of malignant neoplasm of bladder: Secondary | ICD-10-CM | POA: Diagnosis not present

## 2021-12-17 DIAGNOSIS — N35014 Post-traumatic urethral stricture, male, unspecified: Secondary | ICD-10-CM | POA: Diagnosis not present

## 2021-12-21 DIAGNOSIS — E039 Hypothyroidism, unspecified: Secondary | ICD-10-CM | POA: Diagnosis not present

## 2021-12-25 ENCOUNTER — Other Ambulatory Visit: Payer: Self-pay | Admitting: Neurology

## 2021-12-25 DIAGNOSIS — G20 Parkinson's disease: Secondary | ICD-10-CM

## 2022-01-05 ENCOUNTER — Ambulatory Visit: Payer: Medicare PPO | Admitting: Neurology

## 2022-01-13 NOTE — Progress Notes (Signed)
? ? ?  Assessment/Plan:  ? ?1.  Parkinsons Disease, diagnosed November, 2021 ? -Pt to take carbidopa/levodopa 25/100, 1 tablet 3 times per day.  He is now taking his medication tid.   ? -We discussed that it used to be thought that levodopa would increase risk of melanoma but now it is believed that Parkinsons itself likely increases risk of melanoma. he is to get regular skin checks. ? ? ?2.  History of bladder cancer ? -Doing well.  No history of metastatic disease or recurrent disease. ? ?Subjective:  ? ?Anthony Pacheco was seen today in follow up for newly diagnosed Parkinsons disease.  My previous records were reviewed prior to todays visit as well as outside records available to me.  No falls since our last visit.  Last visit, we discussed the importance of taking levodopa 3 times per day (he was only taking it once per day).  Today, he states that he is taking it three times per day.  No falls.  No near syncope.  No exercise except yard work.   ? ?Current prescribed movement disorder medications: ?Carbidopa/levodopa 25/100, 1 tablet 3 times daily  ? ? ?PREVIOUS MEDICATIONS: Sinemet ? ?ALLERGIES:  No Known Allergies ? ? ?Objective:  ? ?PHYSICAL EXAMINATION:   ? ?VITALS:   ?Vitals:  ? 01/18/22 0842  ?BP: 115/62  ?Pulse: 80  ?SpO2: 97%  ?Weight: 165 lb (74.8 kg)  ?Height: '6\' 1"'$  (1.854 m)  ? ? ? ? ?GEN:  The patient appears stated age and is in NAD. ?HEENT:  Normocephalic, atraumatic.  The mucous membranes are moist. The superficial temporal arteries are without ropiness or tenderness. ?CV:  RRR ?Lungs:  CTAB ?Neck/HEME:  There are no carotid bruits bilaterally. ? ?Neurological examination: ? ?Orientation: The patient is alert and oriented x3. ?Cranial nerves: There is good facial symmetry with min facial hypomimia. The speech is fluent and clear. Soft palate rises symmetrically and there is no tongue deviation. Hearing is intact to conversational tone. ?Sensation: Sensation is intact to light touch  throughout ?Motor: Strength is at least antigravity x4. ? ?Movement examination: ?Tone: There is just mild increased tone in the LUE ?Abnormal movements: there is LUE rest tremor that increases with distraction.   ?Coordination:  There is no decremation with any form of RAMS, including alternating supination and pronation of the forearm, hand opening and closing, finger taps, heel taps and toe taps. ?Gait and Station: The patient has no difficulty arising out of a deep-seated chair without the use of the hands. The patient's stride length is good with re-emergent tremor on the L ? ?I have reviewed and interpreted the following labs independently ? ?  Chemistry   ?   ?Component Value Date/Time  ? NA 143 05/25/2021 1452  ? K 4.3 05/25/2021 1452  ? CL 106 05/25/2021 1452  ? CO2 27 05/25/2021 1452  ? BUN 16 05/25/2021 1452  ? CREATININE 1.41 (H) 05/25/2021 1452  ?    ?Component Value Date/Time  ? CALCIUM 9.4 05/25/2021 1452  ? ALKPHOS 59 05/25/2021 1452  ? AST 12 (L) 05/25/2021 1452  ? ALT <6 05/25/2021 1452  ? BILITOT 0.6 05/25/2021 1452  ?  ? ? ? ?Lab Results  ?Component Value Date  ? WBC 7.3 05/25/2021  ? HGB 12.8 (L) 05/25/2021  ? HCT 39.7 05/25/2021  ? MCV 93.4 05/25/2021  ? PLT 250 05/25/2021  ? ? ?No results found for: TSH ? ? ? ?Cc:  [name], [name], MD ? ?

## 2022-01-18 ENCOUNTER — Ambulatory Visit: Payer: Medicare PPO | Admitting: Neurology

## 2022-01-18 ENCOUNTER — Encounter: Payer: Self-pay | Admitting: Neurology

## 2022-01-18 DIAGNOSIS — G20A1 Parkinson's disease without dyskinesia, without mention of fluctuations: Secondary | ICD-10-CM

## 2022-01-18 DIAGNOSIS — G20 Parkinson's disease: Secondary | ICD-10-CM

## 2022-01-18 MED ORDER — CARBIDOPA-LEVODOPA 25-100 MG PO TABS: ORAL_TABLET | ORAL | 1 refills | Status: DC

## 2022-01-18 NOTE — Patient Instructions (Signed)
As we discussed, it used to be thought that levodopa would increase risk of melanoma but now it is believed that Parkinsons itself likely increases risk of melanoma. I recommend yearly skin checks with a board certified dermatologist.  You can call [facility] or [facility] for an appointment. ° °[facility] °Address: [address], [place], [place] [address] °Phone: [phone] ° °[facility] °[address], [place], [place] °Phone: [phone] ° °

## 2022-03-02 ENCOUNTER — Other Ambulatory Visit: Payer: Self-pay | Admitting: Nurse Practitioner

## 2022-05-24 ENCOUNTER — Inpatient Hospital Stay: Payer: Medicare PPO | Attending: Oncology

## 2022-05-24 ENCOUNTER — Ambulatory Visit (HOSPITAL_COMMUNITY)
Admission: RE | Admit: 2022-05-24 | Discharge: 2022-05-24 | Disposition: A | Discharge status: Home / Self Care | Payer: Medicare PPO | Source: Ambulatory Visit | Attending: Oncology | Admitting: Oncology

## 2022-05-24 ENCOUNTER — Other Ambulatory Visit: Payer: Self-pay

## 2022-05-24 ENCOUNTER — Encounter (HOSPITAL_COMMUNITY): Payer: Self-pay

## 2022-05-24 DIAGNOSIS — Z8551 Personal history of malignant neoplasm of bladder: Secondary | ICD-10-CM | POA: Insufficient documentation

## 2022-05-24 DIAGNOSIS — C679 Malignant neoplasm of bladder, unspecified: Secondary | ICD-10-CM | POA: Diagnosis not present

## 2022-05-24 DIAGNOSIS — K8020 Calculus of gallbladder without cholecystitis without obstruction: Secondary | ICD-10-CM | POA: Diagnosis not present

## 2022-05-24 DIAGNOSIS — J8410 Pulmonary fibrosis, unspecified: Secondary | ICD-10-CM | POA: Diagnosis not present

## 2022-05-24 DIAGNOSIS — J984 Other disorders of lung: Secondary | ICD-10-CM | POA: Diagnosis not present

## 2022-05-24 DIAGNOSIS — J929 Pleural plaque without asbestos: Secondary | ICD-10-CM | POA: Diagnosis not present

## 2022-05-24 DIAGNOSIS — I700 Atherosclerosis of aorta: Secondary | ICD-10-CM | POA: Diagnosis not present

## 2022-05-24 DIAGNOSIS — N3289 Other specified disorders of bladder: Secondary | ICD-10-CM | POA: Diagnosis not present

## 2022-05-24 DIAGNOSIS — K429 Umbilical hernia without obstruction or gangrene: Secondary | ICD-10-CM | POA: Diagnosis not present

## 2022-05-24 LAB — CMP (CANCER CENTER ONLY)
ALT: 9 U/L (ref 0–44)
AST: 14 U/L — ABNORMAL LOW (ref 15–41)
Albumin: 4.2 g/dL (ref 3.5–5.0)
Alkaline Phosphatase: 61 U/L (ref 38–126)
Anion gap: 4 — ABNORMAL LOW (ref 5–15)
BUN: 16 mg/dL (ref 8–23)
CO2: 31 mmol/L (ref 22–32)
Calcium: 9.3 mg/dL (ref 8.9–10.3)
Chloride: 106 mmol/L (ref 98–111)
Creatinine: 1.3 mg/dL — ABNORMAL HIGH (ref 0.61–1.24)
GFR, Estimated: 55 mL/min — ABNORMAL LOW (ref >60)
Glucose, Bld: 97 mg/dL (ref 70–99)
Potassium: 4.7 mmol/L (ref 3.5–5.1)
Sodium: 141 mmol/L (ref 135–145)
Total Bilirubin: 0.7 mg/dL (ref 0.3–1.2)
Total Protein: 6.7 g/dL (ref 6.5–8.1)

## 2022-05-24 LAB — CBC WITH DIFFERENTIAL (CANCER CENTER ONLY)
Abs Immature Granulocytes: 0.04 10*3/uL (ref 0.00–0.07)
Basophils Absolute: 0.1 10*3/uL (ref 0.0–0.1)
Basophils Relative: 1 %
Eosinophils Absolute: 0.1 10*3/uL (ref 0.0–0.5)
Eosinophils Relative: 2 %
HCT: 39.4 % (ref 39.0–52.0)
Hemoglobin: 13.1 g/dL (ref 13.0–17.0)
Immature Granulocytes: 1 %
Lymphocytes Relative: 18 %
Lymphs Abs: 1.3 10*3/uL (ref 0.7–4.0)
MCH: 30.8 pg (ref 26.0–34.0)
MCHC: 33.2 g/dL (ref 30.0–36.0)
MCV: 92.5 fL (ref 80.0–100.0)
Monocytes Absolute: 0.6 10*3/uL (ref 0.1–1.0)
Monocytes Relative: 8 %
Neutro Abs: 4.8 10*3/uL (ref 1.7–7.7)
Neutrophils Relative %: 70 %
Platelet Count: 230 10*3/uL (ref 150–400)
RBC: 4.26 MIL/uL (ref 4.22–5.81)
RDW: 12.7 % (ref 11.5–15.5)
WBC Count: 6.9 10*3/uL (ref 4.0–10.5)
nRBC: 0 % (ref 0.0–0.2)

## 2022-05-24 MED ORDER — SODIUM CHLORIDE 0.9 % IV SOLN
INTRAVENOUS | Status: AC
  Filled 2022-05-24: qty 250

## 2022-05-24 MED ORDER — IOHEXOL 300 MG/ML  SOLN
100.0000 mL | Freq: Once | INTRAMUSCULAR | Status: AC | PRN
  Administered 2022-05-24: 100 mL via INTRAVENOUS

## 2022-05-24 MED ORDER — SODIUM CHLORIDE (PF) 0.9 % IJ SOLN
INTRAMUSCULAR | Status: AC
  Filled 2022-05-24: qty 50

## 2022-06-01 ENCOUNTER — Inpatient Hospital Stay: Payer: Medicare PPO | Attending: Oncology | Admitting: Oncology

## 2022-06-01 ENCOUNTER — Other Ambulatory Visit: Payer: Self-pay

## 2022-06-01 DIAGNOSIS — C679 Malignant neoplasm of bladder, unspecified: Secondary | ICD-10-CM

## 2022-06-01 DIAGNOSIS — Z923 Personal history of irradiation: Secondary | ICD-10-CM | POA: Insufficient documentation

## 2022-06-01 DIAGNOSIS — G20 Parkinson's disease: Secondary | ICD-10-CM | POA: Diagnosis not present

## 2022-06-01 DIAGNOSIS — Z8551 Personal history of malignant neoplasm of bladder: Secondary | ICD-10-CM | POA: Diagnosis not present

## 2022-06-01 DIAGNOSIS — N289 Disorder of kidney and ureter, unspecified: Secondary | ICD-10-CM | POA: Insufficient documentation

## 2022-06-01 DIAGNOSIS — Z79899 Other long term (current) drug therapy: Secondary | ICD-10-CM | POA: Insufficient documentation

## 2022-06-01 NOTE — Progress Notes (Signed)
Hematology and Oncology Follow Up Visit  Anthony Pacheco [id number] Jan 14, 1936 86 y.o. 06/01/2022 9:46 AM [name], [name], MDMitchell, [name], MD   Principle Diagnosis: 86 year old man with T2N0 urothelial carcinoma of the bladder diagnosed in 2019.   Prior Therapy:  TURBT on July 18, 2018.  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  He was found to have T2 lesion with grade 3 histology.   Radiation therapy with weekly carboplatin completed and January 2020.  Current therapy: Active surveillance.  Interim History: [name] returns today for a follow-up.  Since last visit, he reports no major changes in his health.  He was diagnosed with Parkinson's disease and was prescribed Sinemet 1 tablet 3 times daily which have helped with his symptoms.  He denies any nausea, vomiting or abdominal pain.  He denies any excessive fatigue or tiredness.  He is able to attend to activities of daily living.  He denies any urinary symptoms including hematuria or dysuria.      Medications: Reviewed without changes. Current Outpatient Medications  Medication Sig Dispense Refill   carbidopa-levodopa (SINEMET IR) 25-100 MG tablet 1 po tid at 8am/noon/4pm 270 tablet 1   levothyroxine (SYNTHROID, LEVOTHROID) 100 MCG tablet Take 100 mcg by mouth daily.  3   No current facility-administered medications for this visit.     Allergies: No Known Allergies     Physical Exam:      ECOG: 1   General appearance: Alert, awake without any distress. Head: Atraumatic without abnormalities Oropharynx: Without any thrush or ulcers. Eyes: No scleral icterus. Lymph nodes: No lymphadenopathy noted in the cervical, supraclavicular, or axillary nodes Heart:regular rate and rhythm, without any murmurs or gallops.   Lung: Clear to auscultation without any rhonchi, wheezes or dullness to percussion. Abdomin: Soft,  nontender without any shifting dullness or ascites. Musculoskeletal: No clubbing or cyanosis. Neurological: No motor or sensory deficits. Skin: No rashes or lesions.           Lab Results: Lab Results  Component Value Date   WBC 6.9 05/24/2022   HGB 13.1 05/24/2022   HCT 39.4 05/24/2022   MCV 92.5 05/24/2022   PLT 230 05/24/2022     Chemistry      Component Value Date/Time   NA 141 05/24/2022 1013   K 4.7 05/24/2022 1013   CL 106 05/24/2022 1013   CO2 31 05/24/2022 1013   BUN 16 05/24/2022 1013   CREATININE 1.30 (H) 05/24/2022 1013      Component Value Date/Time   CALCIUM 9.3 05/24/2022 1013   ALKPHOS 61 05/24/2022 1013   AST 14 (L) 05/24/2022 1013   ALT 9 05/24/2022 1013   BILITOT 0.7 05/24/2022 1013       Impression and Plan:  86 year old man with:   1.   Bladder cancer diagnosed in 2019.  He was found to have T2N0 high-grade urothelial carcinoma.  The natural course of this disease was reviewed at this time and treatment choices were discussed.  He continues to be on active surveillance without any evidence of relapsed disease.  Laboratory data and CT scan obtained on May 24, 2022 were reviewed and showed no evidence of relapse.  At this time, I do not recommend any additional imaging studies and recommend follow-up with urology for surveillance cystoscopy.  He will follow-up with medical oncology in 12 months and discontinue follow-up after that.  His next cystoscopy is tentatively in 6 months under the  care of Dr. Tresa Endo.    2.    Renal insufficiency: Creatinine clearance remains stable at this time.    3.  Follow-up: He will return in 1 year for a follow-up.   30  minutes were spent on this visit.  The time was dedicated to reviewing laboratory data, disease status update and outlining future plan of care discussion.     Zola Button, MD 9/5/20239:46 AM seconds they were trying based on  Waiting for

## 2022-07-20 NOTE — Progress Notes (Unsigned)
Assessment/Plan:   1.  Parkinsons Disease, diagnosed November, 2021  -slight increase carbidopa/levodopa 25/100, 2/1/1.  May need to increase further but will determine that next visit.  -We discussed that it used to be thought that levodopa would increase risk of melanoma but now it is believed that Parkinsons itself likely increases risk of melanoma. he is to get regular skin checks.  We discussed this again today as he doesn't do this.  Resources given.   2.  History of bladder cancer  -Doing well.  No history of metastatic disease or recurrent disease.  He does not even have a follow-up with oncology until 1 year, since he is doing so well.  Subjective:   Lyrik Buresh Duffey was seen today in follow up for newly diagnosed Parkinsons disease.  My previous records were reviewed prior to todays visit as well as outside records available to me.  He reports that he is doing well - "I'm doing fantastic".  Handwriting is not good.    He reports that he is taking his levodopa 3 times per day.  He denies falls, lightheadedness or near syncope.  He continues to follow with oncology for his history of bladder cancer, last being seen on September 5.  Notes reviewed.  Current prescribed movement disorder medications: Carbidopa/levodopa 25/100, 1 tablet 3 times daily    PREVIOUS MEDICATIONS: Sinemet  ALLERGIES:  No Known Allergies   Objective:   PHYSICAL EXAMINATION:    VITALS:   Vitals:   07/22/22 0842  BP: 121/78  Pulse: 74  SpO2: 97%  Weight: 162 lb (73.5 kg)  Height: 6' (1.829 m)       GEN:  The patient appears stated age and is in NAD. HEENT:  Normocephalic, atraumatic.  The mucous membranes are moist. The superficial temporal arteries are without ropiness or tenderness. CV:  RRR Lungs:  CTAB Neck/HEME:  There are no carotid bruits bilaterally.  Neurological examination:  Orientation: The patient is alert and oriented x3. Cranial nerves: There is good facial symmetry  with min facial hypomimia. The speech is fluent and clear. Soft palate rises symmetrically and there is no tongue deviation. Hearing is intact to conversational tone. Sensation: Sensation is intact to light touch throughout Motor: Strength is at least antigravity x4.  Movement examination: Tone: There is mod increased tone in the LUE and mild in the LLE  Abnormal movements: there is LUE rest tremor that increases with distraction.   Coordination:  There is mild decremation with any form of RAMS, including alternating supination and pronation of the forearm, hand opening and closing, finger taps, heel taps and toe taps on the L Gait and Station: The patient has no difficulty arising out of a deep-seated chair without the use of the hands. The patient slightly drags the L leg  I have reviewed and interpreted the following labs independently    Chemistry      Component Value Date/Time   NA 141 05/24/2022 1013   K 4.7 05/24/2022 1013   CL 106 05/24/2022 1013   CO2 31 05/24/2022 1013   BUN 16 05/24/2022 1013   CREATININE 1.30 (H) 05/24/2022 1013      Component Value Date/Time   CALCIUM 9.3 05/24/2022 1013   ALKPHOS 61 05/24/2022 1013   AST 14 (L) 05/24/2022 1013   ALT 9 05/24/2022 1013   BILITOT 0.7 05/24/2022 1013       Lab Results  Component Value Date   WBC 6.9 05/24/2022   HGB  13.1 05/24/2022   HCT 39.4 05/24/2022   MCV 92.5 05/24/2022   PLT 230 05/24/2022    No results found for: "TSH"    Cc:  [name], Anthony Sa, MD

## 2022-07-22 ENCOUNTER — Encounter: Payer: Self-pay | Admitting: Neurology

## 2022-07-22 ENCOUNTER — Ambulatory Visit: Payer: Medicare PPO | Admitting: Neurology

## 2022-07-22 VITALS — BP 121/78 | HR 74 | Ht 72.0 in | Wt 162.0 lb

## 2022-07-22 DIAGNOSIS — G20A1 Parkinson's disease without dyskinesia, without mention of fluctuations: Secondary | ICD-10-CM | POA: Diagnosis not present

## 2022-07-22 MED ORDER — CARBIDOPA-LEVODOPA 25-100 MG PO TABS: ORAL_TABLET | ORAL | 1 refills | Status: DC

## 2022-07-22 NOTE — Patient Instructions (Addendum)
Increase carbidopa/levodopa 25/100 to 2 at 7am, 1 at 11am, 1 at 4pm  As we discussed, it used to be thought that levodopa would increase risk of melanoma but now it is believed that Parkinsons itself likely increases risk of melanoma. I recommend yearly skin checks with a board certified dermatologist.  You can call [facility] or [facility] for an appointment.  [facility] Address: [place], [place], [place] [address] Phone: [phone]  [facility] [place], [place], [place] Phone: [phone]  Local and Online Resources for Power over Parkinson's Group  October 2023    [place] over Parkinson's Group:    Power Over Parkinson's Patient Education Group will be Wednesday, October 11th-*Hybrid meting*- in person at [facility] location and via [facility] at 2 pm.   Upcoming Power over Parkinson's Meetings:  2nd Wednesdays of the month at 2 pm:   October 11th, November 8th, December 13th  Contact Amy Marriott at [email or web address] if interested in participating in this group    [place]! Moves [employer] Instructor-Led Classes offering at [employer]!  TUESDAYS and Wednesdays 1-2 pm.   Contact Vonna Kotyk at  [email or web address] or Caron Presume at [place], [email or web address]  Dance for Parkinson 's classes will be on Tuesdays 9:30am-10:30am starting October 3-December 12 with a break the week of November 21st. Located in the [employer]  which is in the first floor of the [employer] ([place] To register:  [email or web address] or [phone]  Drumming for Parkinson's will be held on 2nd and 4th Mondays at 11:00 am.   Located at the [place] ([place]  [place] at  [email or web address] or [phone]  Through support from the [place] for Parkinson's classes are free for both patients and caregivers.    [employer] Parkinson's Tai Chi Class, Mondays at 11 am.  Call [phone] for details   [place]:  [email or web address]  PD Health at Home continues:  Mindfulness Mondays, Wellness Wednesdays, Fitness Fridays   Upcoming Education:    Parkinson's 101:  What you and your family should know.  Wednesday, Oct. 4th 1-2 pm  Expert Briefing:     Parkinson's and the Gut-Brain Connection.  Wednesday, Oct. 11th 1-2 pm  Hallucinations and Delusions in Parkinson's.  Wednesday, Nov. 8th, 1-2 pm  Register for expert briefings (webinars) at [email or web address]  Please check out their website to sign up for emails and see their full online offerings      [place]:  [email or web address]   Third Thursday Webinars:  On the third Thursday of every month at 12 p.m. ET, join our free live webinars to learn about various aspects of living with Parkinson's disease and our work to speed medical breakthroughs.  Upcoming Webinar:  Surveyor, mining for [employer]. (Replay).  Thursday, Oct. 12th at 12 noon  Check out additional information on their website to see their full online offerings    [place]:  [email or web address]  Upcoming Webinar:   Stay tuned  Webinar Series:  Living with Parkinson's Meetup.   Third Thursdays each month, 3 pm  Care Partner Monthly Meetup.  With Robin Searing [place]inney.  First Tuesday of each month, 2 pm  Check out additional information to Live Well Today on  their website    Parkinson and Movement Disorders (PMD) Alliance:  www.pmdalliance.org  [employer]:  Online Education Events  Sign up for emails, which are sent  weekly to give you updates on programming and online offerings    Parkinson's Association of the [place]:  www.parkinsonassociation.org  Information on online support groups, education events, and online exercises including Yoga, Parkinson's exercises and more-LOTS of information on links to PD resources and online events  Virtual Support Group through Parkinson's Association of the [place]; next one is scheduled for Wednesday, October 4th at 2 pm.  (These are typically scheduled for the 1st Wednesday of the month at 2 pm).  Visit website for details.   MOVEMENT AND EXERCISE OPPORTUNITIES  PWR! Moves Classes at [place].  Wednesdays 10 and 11 am.   Contact Amy Marriott, PT [email or web address] if interested.  NEW PWR! Moves Class offerings at [employer].  *TUESDAYS* and Wednesdays 1-2 pm.  Contact Vonna Kotyk at  [email or web address] or Caron Presume at [place],  [email or web address]  Parkinson's Wellness Recovery (PWR! Moves)  www.[email or web address]  Info on the PWR! Virtual Experience:  You will have access to our expertise?through self-assessment, guided plans that start with the PD-specific fundamentals, educational content, tips, Q&A with an expert, and a growing [employer] of PD-specific pre-recorded and live exercise classes of varying types and intensity - both physical and cognitive! If that is not enough, we offer 1:1 wellness consultations (in-person or virtual) to personalize your PWR! Art therapist.   [place] Fridays:   As part of the PD Health @ Home program, this free video series focuses each week on one aspect of fitness designed to support people living with Parkinson's.? These weekly videos highlight the [place] fitness guidelines for people with Parkinson's disease.  [address]  Dance for PD website is offering free, live-stream classes throughout  the week, as well as links to [email or web address].si of classes:  [email or web address]  Virtual dance and Pilates for Parkinson's classes: Click on the Community Tab> Parkinson's Movement Initiative Tab.  To register for classes and for more information, visit www.[employer] and click the "community" tab.   [employer] Parkinson's Cycling Classes   [employer]:  Thursdays @ Noon-Live classes at 11-11-1983 ([employer] at [email or web address]?or [phone])  [facility] [employer]: Virtual Classes Mondays and Thursdays 11-11-1983 classes Tuesday, Wednesday and Thursday (contact [place] at [email or web address] ?or [phone])  [place]  Varied levels of classes are offered Tuesdays and Thursdays at 11-11-1983.   Stretching with [employer] weekly class is also offered for people with Parkinson's  To observe a class or for more information, call [phone] or email [phone] at [email or web address]@[email or web address]   ADDITIONAL SUPPORT AND RESOURCES  [employer]:Online Caregiver Education Opportunities:  www.well-springsolutions.org/caregiver-education/caregiver-support-group.  You may also contact Hezzie Bump at [email or web address] or [phone].     Well-Spring Navigator:  Just1Navigator program, a?free service to help individuals and families through the journey of determining care for older adults.  The "Navigator" is a 11-27-1986, [phone], who will speak with a prospective client and/or loved ones to provide an assessment of the situation and a set of recommendations for a personalized care plan -- all free of charge, and whether?Well[employer]Spring [employer] offers the needed service or not. If the need is not a service we provide, we are well-connected with reputable programs in town that we can refer you to.  www.well-springsolutions.org or to speak with the Navigator, call [phone].

## 2022-09-02 DIAGNOSIS — H40031 Anatomical narrow angle, right eye: Secondary | ICD-10-CM | POA: Diagnosis not present

## 2022-09-02 DIAGNOSIS — H43812 Vitreous degeneration, left eye: Secondary | ICD-10-CM | POA: Diagnosis not present

## 2022-09-02 DIAGNOSIS — H26492 Other secondary cataract, left eye: Secondary | ICD-10-CM | POA: Diagnosis not present

## 2022-09-02 DIAGNOSIS — H04123 Dry eye syndrome of bilateral lacrimal glands: Secondary | ICD-10-CM | POA: Diagnosis not present

## 2022-09-02 DIAGNOSIS — D3132 Benign neoplasm of left choroid: Secondary | ICD-10-CM | POA: Diagnosis not present

## 2022-09-02 DIAGNOSIS — H25811 Combined forms of age-related cataract, right eye: Secondary | ICD-10-CM | POA: Diagnosis not present

## 2022-10-08 ENCOUNTER — Telehealth: Payer: Self-pay | Admitting: Oncology

## 2022-10-08 NOTE — Telephone Encounter (Signed)
Called patient per Dr. Alen Blew transition. Patient OK going to [facility] with Ned Card. Forwarding to location scheduler.

## 2022-10-19 DIAGNOSIS — H9190 Unspecified hearing loss, unspecified ear: Secondary | ICD-10-CM | POA: Diagnosis not present

## 2022-10-19 DIAGNOSIS — Z23 Encounter for immunization: Secondary | ICD-10-CM | POA: Diagnosis not present

## 2022-10-19 DIAGNOSIS — E039 Hypothyroidism, unspecified: Secondary | ICD-10-CM | POA: Diagnosis not present

## 2022-10-19 DIAGNOSIS — I700 Atherosclerosis of aorta: Secondary | ICD-10-CM | POA: Diagnosis not present

## 2022-10-19 DIAGNOSIS — Z0000 Encounter for general adult medical examination without abnormal findings: Secondary | ICD-10-CM | POA: Diagnosis not present

## 2022-10-19 DIAGNOSIS — G20A1 Parkinson's disease without dyskinesia, without mention of fluctuations: Secondary | ICD-10-CM | POA: Diagnosis not present

## 2022-11-21 ENCOUNTER — Other Ambulatory Visit: Payer: Self-pay | Admitting: Neurology

## 2022-11-21 DIAGNOSIS — G20A1 Parkinson's disease without dyskinesia, without mention of fluctuations: Secondary | ICD-10-CM

## 2022-11-24 NOTE — Telephone Encounter (Signed)
Patient called in stating that he is now out of medication, and pharmacy has sent several requests with no response.

## 2022-12-23 DIAGNOSIS — E039 Hypothyroidism, unspecified: Secondary | ICD-10-CM | POA: Diagnosis not present

## 2022-12-28 DIAGNOSIS — N3000 Acute cystitis without hematuria: Secondary | ICD-10-CM | POA: Diagnosis not present

## 2022-12-28 DIAGNOSIS — R3915 Urgency of urination: Secondary | ICD-10-CM | POA: Diagnosis not present

## 2023-01-11 DIAGNOSIS — N3000 Acute cystitis without hematuria: Secondary | ICD-10-CM | POA: Diagnosis not present

## 2023-01-24 NOTE — Progress Notes (Unsigned)
Assessment/Plan:   1.  Parkinsons Disease, diagnosed November, 2021  -Continue carbidopa/levodopa 25/100, 2/1/1.  May need to increase further but will determine that next visit.  -We discussed that it used to be thought that levodopa would increase risk of melanoma but now it is believed that Parkinsons itself likely increases risk of melanoma. he is to get regular skin checks.  We discussed this again today as he doesn't do this.  Resources given.   2.  History of bladder cancer  -Doing well.  No history of metastatic disease or recurrent disease.  He does not even have a follow-up with oncology until 1 year, since he is doing so well.  Subjective:   Anthony Pacheco was seen today in follow up for newly diagnosed Parkinsons disease.  My previous records were reviewed prior to todays visit as well as outside records available to me.  Last visit we just slightly increased his levodopa, and told him we may do it again this visit since it was such a small increase.  He reports he tolerated it well.  He has had no falls.  No hallucinations.  No lightheadedness or near syncope.  Current prescribed movement disorder medications: Carbidopa/levodopa 25/100, 2/1/1   PREVIOUS MEDICATIONS: Sinemet  ALLERGIES:  No Known Allergies   Objective:   PHYSICAL EXAMINATION:    VITALS:   Vitals:   01/25/23 0829  BP: 122/74  Pulse: 74  SpO2: 97%  Weight: 158 lb 9.6 oz (71.9 kg)  Height: 6' (1.829 m)        GEN:  The patient appears stated age and is in NAD. HEENT:  Normocephalic, atraumatic.  The mucous membranes are moist. The superficial temporal arteries are without ropiness or tenderness. CV:  RRR Lungs:  CTAB Neck/HEME:  There are no carotid bruits bilaterally.  Neurological examination:  Orientation: The patient is alert and oriented x3. Cranial nerves: There is good facial symmetry with min facial hypomimia. The speech is fluent and clear. Soft palate rises symmetrically  and there is no tongue deviation. Hearing is intact to conversational tone. Sensation: Sensation is intact to light touch throughout Motor: Strength is at least antigravity x4.  Movement examination: Tone: There is min increased tone in the LUE (improved) Abnormal movements: there is LUE rest tremor that increases with distraction.   Coordination:  There is normal decremation with any form of RAMS, including alternating supination and pronation of the forearm, hand opening and closing, finger taps, heel taps and toe taps.  Gait and Station: The patient has no difficulty arising out of a deep-seated chair without the use of the hands. The patient ambulates well today with slightly decreased arm swing   I have reviewed and interpreted the following labs independently    Chemistry      Component Value Date/Time   NA 141 05/24/2022 1013   K 4.7 05/24/2022 1013   CL 106 05/24/2022 1013   CO2 31 05/24/2022 1013   BUN 16 05/24/2022 1013   CREATININE 1.30 (H) 05/24/2022 1013      Component Value Date/Time   CALCIUM 9.3 05/24/2022 1013   ALKPHOS 61 05/24/2022 1013   AST 14 (L) 05/24/2022 1013   ALT 9 05/24/2022 1013   BILITOT 0.7 05/24/2022 1013       Lab Results  Component Value Date   WBC 6.9 05/24/2022   HGB 13.1 05/24/2022   HCT 39.4 05/24/2022   MCV 92.5 05/24/2022   PLT 230 05/24/2022  No results found for: "TSH"    Cc:  [name], L.August Saucer, MD

## 2023-01-25 ENCOUNTER — Ambulatory Visit: Payer: Medicare PPO | Admitting: Neurology

## 2023-01-25 ENCOUNTER — Encounter: Payer: Self-pay | Admitting: Neurology

## 2023-01-25 VITALS — BP 122/74 | HR 74 | Ht 72.0 in | Wt 158.6 lb

## 2023-01-25 DIAGNOSIS — G20A1 Parkinson's disease without dyskinesia, without mention of fluctuations: Secondary | ICD-10-CM

## 2023-01-25 MED ORDER — CARBIDOPA-LEVODOPA 25-100 MG PO TABS: ORAL_TABLET | ORAL | 1 refills | Status: DC

## 2023-01-25 NOTE — Patient Instructions (Signed)
You look great today!  Local and Online Resources for Power over Parkinson's Group  April 2024   LOCAL [place] PARKINSON'S GROUPS   Power over Parkinson's Group:    Power Over Parkinson's Patient Education Group will be Wednesday, April 10th-*Hybrid meting*- in person at [place] [facility] location and via WEBEX, 2:00-3:00 pm.   Power over Parkinson's and Care Partner Groups will meet together, with plans for separate break out session for caregivers, depending on topic/speaker Upcoming Power over Parkinson's Meetings/Care Partner Support:  2nd Wednesdays of the month at 2 pm:   April 10th, May 8th Contact Amy Marriott at [email or web address] if interested in participating in this group    LOCAL EVENTS AND NEW OFFERINGS  NEW:  Parkinson's Social Game Night.  First Thursday of each month, 2:00-4:00 pm.  *Next date is April 4th*.  [facility], [place].  Contact [email or web address] if interested. Parkinson's CarePartner Group for Men is in the works, if interested email [email or web address] ACT [employer] Chair Yoga classes "Train and Gain", Fridays 10 am, [employer].  Contact Gina at [phone].  Community Fitness Instructor-Led Parkinson's Exercises Classes offering at [employer]!  TUESDAYS (Chair Yoga)  and Wednesdays (PWR! Moves)  1:00 pm.   Contact Christy Weaver at  [email or web address]  or [phone]  Drumming for Parkinson's will be held on 2nd and 4th Mondays at 11:00 am.   Located at the [employer] ([address] [place]  Contact Jane Maydian at [email or web address] or [phone]  Dance for Parkinson 's classes will be on Tuesdays 10-11 am. Located in the [facility], in the first floor of the [facility] ([address] To register:  [email or web address] or [phone] [employer] Parkinson's Tai Chi Class, Mondays at 11 am.  Call  [phone] for details [place] Challenge.  Bike, Run, Walk Fundraiser for Parkinson's.  Saturday, April 6th at [place].  To register, visit [email or web address] Moving Day [place].  Saturday, May 4th, 10 am start.  Register at [email or web address]    ONLINE EDUCATION AND SUPPORT  Parkinson Foundation:  www.parkinson.org  PD Health at Home continues:  Mindfulness Mondays, Wellness Wednesdays, Fitness Fridays  (PWR! Moves as part of Fitness Fridays March 22nd, 1-1:45 pm) Upcoming Education:   Parkinson's 101.  Wednesday, April 3rd, 1-2 pm Movement for Parkinson's.  Wednesday, May 1st, 1-2 pm Expert Briefing:  Research Update:  Working to Halt PD.  Wednesday, April 10th, 1-2 pm Trouble with Zzz's:  Sleep Challenges with Parkinson's.  Wed, May 8th 1-2 pm Register for virtual education and expert briefings (webinars) at www.parkinson.org/resources-support/online-education Please check out their website to sign up for emails and see their full online offerings     Michael J Fox Foundation:  www.michaeljfox.org   Third Thursday Webinars:  On the third Thursday of every month at 12 p.m. ET, join our free live webinars to learn about various aspects of living with Parkinson's disease and our work to speed medical breakthroughs.  Upcoming Webinar:  Let's Talk Taboos:  Hard-to-Discuss Parkinson's Symptoms.  Thursday, April 18th at 12 noon. Check out additional information on their website to see their full online offerings    Davis Phinney Foundation:  www.davisphinneyfoundation.org  Upcoming Webinar:   [facility] [employer].  Wednesday, April 2nd, 4 pm Series:  Living with Parkinson's Meetup.   Third Thursdays each month, 3 pm  Care Partner Monthly Meetup.  With Connie Carpenter Phinney.  First Tuesday of each month, 2   pm  Check out additional information to Live Well Today on their website    Parkinson and Movement Disorders (PMD) Alliance:  www.pmdalliance.org   [employer]:  Online Education Events  Sign up for emails, which are sent weekly to give you updates on programming and online offerings    Parkinson's Association of [place]:  www.parkinsonassociation.org  Information on online support groups, education events, and online exercises including Yoga, Parkinson's exercises and more-LOTS of information on links to PD resources and online events  Virtual Support Group through Parkinson's Association of the [place]; next one is scheduled for Wednesday, April 3rd  MOVEMENT AND EXERCISE OPPORTUNITIES  PWR! Moves Classes at [place] [facility].  Wednesdays 10 and 11 am.   Contact Amy Marriott, PT [email or web address] if interested.  Parkinson's Exercise Class offerings at [employer]. *TUESDAYS* (Chair yoga) and Wednesdays (PWR! Moves)  1:00 pm.    Contact Christy Weaver at [email or web address]    Parkinson's Wellness Recovery (PWR! Moves)  www.pwr4life.org  Info on the PWR! Virtual Experience:  You will have access to our expertise?through self-assessment, guided plans that start with the PD-specific fundamentals, educational content, tips, Q&A with an expert, and a growing library of PD-specific pre-recorded and live exercise classes of varying types and intensity - both physical and cognitive! If that is not enough, we offer 1:1 wellness consultations (in-person or virtual) to personalize your PWR! Virtual Experience.   Parkinson Foundation Fitness Fridays:   As part of the PD Health @ Home program, this free video series focuses each week on one aspect of fitness designed to support people living with Parkinson's.? These weekly videos highlight the Parkinson Foundation fitness guidelines for people with Parkinson's disease.  www.parkinson.org/resources-support/online-education/pdhealth#ff  Dance for PD website is offering free, live-stream classes throughout the week, as well as links to digital library of  classes:  https://danceforparkinsons.org/  Virtual dance and Pilates for Parkinson's classes: Click on the Community Tab> Parkinson's Movement Initiative Tab.  To register for classes and for more information, visit www.americandancefestival.org and click the "community" tab.   [employer] Parkinson's Cycling Classes   [employer]:  Thursdays @ Noon-Live classes at [employer] (Contact Margaret Hazen at [email or web address]?or [phone])  [employer]: Classes Tuesday, Wednesday and Thursday (contact Marlee at [email or web address] ?or [phone])  [place] [employer] Boxing  Varied levels of classes are offered Tuesdays and Thursdays at [employer] [facility] [facility].   Stretching with Maria weekly class is also offered for people with Parkinson's  To observe a class or for more information, call [phone] or email Hillary Savage at [email or web address]   ADDITIONAL SUPPORT AND RESOURCES  [employer]:  Online Caregiver Education Opportunities:  www.well-springsolutions.org/caregiver-education/caregiver-support-group.  You may also contact Jodi Kolada at [email or web address] or [phone].     [employer] April Offerings National HealthCare Decisions Day:  The Most Critical Legal and Medical Decisions to Consider Now!  Tuesday, April 16th, 1-3 pm at [facility] at [address].  Contact Jodi Kolada at [email or web address] or [phone] Powerful Tools for Caregivers.  6 week educational series for caregivers.  April 18-May 23, 10:30 am-12:15 pm at [facility] 3rd Floor Conference Room.   Contact Jodi Kolada at [email or web address] or [phone] to register Well-Spring Navigator:  Just1Navigator program, a?free service to help individuals and families through the journey of determining care for older adults.  The "Navigator" is a social worker, Nicole Reynolds, who will speak with a prospective client and/or  loved ones to provide an assessment of the situation and a set of   recommendations for a personalized care plan -- all free of charge, and whether?[employer] offers the needed service or not. If the need is not a service we provide, we are well-connected with reputable programs in town that we can refer you to.  www.well-springsolutions.org or to speak with the Navigator, call [phone].     

## 2023-02-18 ENCOUNTER — Other Ambulatory Visit: Payer: Self-pay | Admitting: Neurology

## 2023-02-18 DIAGNOSIS — G20A1 Parkinson's disease without dyskinesia, without mention of fluctuations: Secondary | ICD-10-CM

## 2023-04-28 DIAGNOSIS — E039 Hypothyroidism, unspecified: Secondary | ICD-10-CM | POA: Diagnosis not present

## 2023-05-24 DIAGNOSIS — Z8551 Personal history of malignant neoplasm of bladder: Secondary | ICD-10-CM | POA: Diagnosis not present

## 2023-05-24 DIAGNOSIS — R319 Hematuria, unspecified: Secondary | ICD-10-CM | POA: Diagnosis not present

## 2023-05-24 DIAGNOSIS — R300 Dysuria: Secondary | ICD-10-CM | POA: Diagnosis not present

## 2023-05-24 DIAGNOSIS — Z08 Encounter for follow-up examination after completed treatment for malignant neoplasm: Secondary | ICD-10-CM | POA: Diagnosis not present

## 2023-06-01 NOTE — Progress Notes (Unsigned)
  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  He underwent TURBT 07/18/2018.  He was found to have high-grade invasive urothelial carcinoma; with suspicion for invasion into the muscularis propria, T2 lesion with grade 3 histology.  He completed radiation therapy with weekly carboplatin 09/04/2018 through 10/25/2018.  He was previously followed by Dr. Clelia Croft, last visit 06/01/2022.  Objective:  Vital signs in last 24 hours:  There were no vitals taken for this visit.    HEENT: *** Lymphatics: *** Resp: *** Cardio: *** GI: *** Vascular: *** Neuro: ***  Skin: ***    Lab Results:  Lab Results  Component Value Date   WBC 6.9 05/24/2022   HGB 13.1 05/24/2022   HCT 39.4 05/24/2022   MCV 92.5 05/24/2022   PLT 230 05/24/2022   NEUTROABS 4.8 05/24/2022    Imaging:  No results found.  Medications: I have reviewed the patient's current medications.  Assessment/Plan:   Disposition:    Lonna Cobb ANP/GNP-BC   06/01/2023  5:30 PM

## 2023-06-02 ENCOUNTER — Encounter: Payer: Self-pay | Admitting: Nurse Practitioner

## 2023-06-02 ENCOUNTER — Inpatient Hospital Stay: Payer: Medicare PPO

## 2023-06-02 ENCOUNTER — Telehealth: Payer: Self-pay

## 2023-06-02 ENCOUNTER — Ambulatory Visit: Payer: Medicare PPO | Admitting: Oncology

## 2023-06-02 ENCOUNTER — Inpatient Hospital Stay: Payer: Medicare PPO | Attending: Nurse Practitioner | Admitting: Nurse Practitioner

## 2023-06-02 ENCOUNTER — Other Ambulatory Visit: Payer: Medicare PPO

## 2023-06-02 VITALS — BP 140/68 | HR 79 | Temp 98.1°F | Resp 18 | Ht 72.0 in | Wt 149.5 lb

## 2023-06-02 DIAGNOSIS — G20A1 Parkinson's disease without dyskinesia, without mention of fluctuations: Secondary | ICD-10-CM | POA: Diagnosis not present

## 2023-06-02 DIAGNOSIS — Z923 Personal history of irradiation: Secondary | ICD-10-CM | POA: Insufficient documentation

## 2023-06-02 DIAGNOSIS — Z9221 Personal history of antineoplastic chemotherapy: Secondary | ICD-10-CM | POA: Insufficient documentation

## 2023-06-02 DIAGNOSIS — R300 Dysuria: Secondary | ICD-10-CM | POA: Insufficient documentation

## 2023-06-02 DIAGNOSIS — E039 Hypothyroidism, unspecified: Secondary | ICD-10-CM | POA: Diagnosis not present

## 2023-06-02 DIAGNOSIS — Z8551 Personal history of malignant neoplasm of bladder: Secondary | ICD-10-CM | POA: Insufficient documentation

## 2023-06-02 DIAGNOSIS — C679 Malignant neoplasm of bladder, unspecified: Secondary | ICD-10-CM

## 2023-06-02 LAB — CBC WITH DIFFERENTIAL (CANCER CENTER ONLY)
Abs Immature Granulocytes: 0.03 10*3/uL (ref 0.00–0.07)
Basophils Absolute: 0.1 10*3/uL (ref 0.0–0.1)
Basophils Relative: 1 %
Eosinophils Absolute: 0.2 10*3/uL (ref 0.0–0.5)
Eosinophils Relative: 2 %
HCT: 44.3 % (ref 39.0–52.0)
Hemoglobin: 14.3 g/dL (ref 13.0–17.0)
Immature Granulocytes: 0 %
Lymphocytes Relative: 22 %
Lymphs Abs: 1.7 10*3/uL (ref 0.7–4.0)
MCH: 31.1 pg (ref 26.0–34.0)
MCHC: 32.3 g/dL (ref 30.0–36.0)
MCV: 96.3 fL (ref 80.0–100.0)
Monocytes Absolute: 0.6 10*3/uL (ref 0.1–1.0)
Monocytes Relative: 7 %
Neutro Abs: 5.3 10*3/uL (ref 1.7–7.7)
Neutrophils Relative %: 68 %
Platelet Count: 253 10*3/uL (ref 150–400)
RBC: 4.6 MIL/uL (ref 4.22–5.81)
RDW: 12.6 % (ref 11.5–15.5)
WBC Count: 7.9 10*3/uL (ref 4.0–10.5)
nRBC: 0 % (ref 0.0–0.2)

## 2023-06-02 LAB — CMP (CANCER CENTER ONLY)
ALT: 5 U/L (ref 0–44)
AST: 14 U/L — ABNORMAL LOW (ref 15–41)
Albumin: 4.4 g/dL (ref 3.5–5.0)
Alkaline Phosphatase: 51 U/L (ref 38–126)
Anion gap: 7 (ref 5–15)
BUN: 19 mg/dL (ref 8–23)
CO2: 29 mmol/L (ref 22–32)
Calcium: 9.4 mg/dL (ref 8.9–10.3)
Chloride: 108 mmol/L (ref 98–111)
Creatinine: 1.44 mg/dL — ABNORMAL HIGH (ref 0.61–1.24)
GFR, Estimated: 48 mL/min — ABNORMAL LOW (ref >60)
Glucose, Bld: 99 mg/dL (ref 70–99)
Potassium: 5.3 mmol/L — ABNORMAL HIGH (ref 3.5–5.1)
Sodium: 144 mmol/L (ref 135–145)
Total Bilirubin: 0.5 mg/dL (ref 0.3–1.2)
Total Protein: 7.3 g/dL (ref 6.5–8.1)

## 2023-06-02 LAB — URINALYSIS, COMPLETE (UACMP) WITH MICROSCOPIC
Bacteria, UA: NONE SEEN
Bilirubin Urine: NEGATIVE
Glucose, UA: NEGATIVE mg/dL
Ketones, ur: NEGATIVE mg/dL
Nitrite: NEGATIVE
RBC / HPF: >50 RBC/hpf (ref 0–5)
Specific Gravity, Urine: 1.021 (ref 1.005–1.030)
pH: 5 (ref 5.0–8.0)

## 2023-06-02 MED ORDER — SULFAMETHOXAZOLE-TRIMETHOPRIM 800-160 MG PO TABS: 1.0000 | ORAL_TABLET | Freq: Two times a day (BID) | ORAL | 0 refills | Status: AC

## 2023-06-02 NOTE — Telephone Encounter (Signed)
-----   Message from Lonna Cobb sent at 06/02/2023  3:35 PM EDT ----- Please let him know I sent a prescription to his pharmacy for Bactrim and confirm I sent it to the correct pharmacy.  Thanks

## 2023-06-02 NOTE — Telephone Encounter (Signed)
I routine the lab result to the Dr. Clovis Riley.

## 2023-06-02 NOTE — Telephone Encounter (Signed)
-----   Message from Lonna Cobb sent at 06/02/2023  3:37 PM EDT ----- Please send a copy of today's labs to his PCP for follow-up of hyperkalemia.  Thanks

## 2023-06-03 ENCOUNTER — Telehealth: Payer: Self-pay | Admitting: *Deleted

## 2023-06-03 ENCOUNTER — Telehealth: Payer: Self-pay

## 2023-06-03 LAB — URINE CULTURE: Culture: NO GROWTH

## 2023-06-03 NOTE — Telephone Encounter (Signed)
Patient gave verbal understanding and had no further questions or concerns  

## 2023-06-03 NOTE — Telephone Encounter (Signed)
-----   Message from Lonna Cobb sent at 06/03/2023  8:03 AM EDT ----- Please let him know we would like him to follow-up with PCP regarding renal insufficiency, hyperkalemia, and weight loss.

## 2023-06-03 NOTE — Telephone Encounter (Signed)
Faxed urology referral to [facility] and was informed Dr. Gaynelle Arabian is no longer at that facility. Reached out to Dr. Jinny Sanders office with [facility] and was told his PCP referred him there 8/27 and they have been trying to reach him, but he is not returning call.  Called Anthony Pacheco and made him aware of above and provided him phone # [phone] to call to make appointment. He agrees to do so today. Faxed chart note to [phone]

## 2023-06-09 DIAGNOSIS — C672 Malignant neoplasm of lateral wall of bladder: Secondary | ICD-10-CM | POA: Diagnosis not present

## 2023-06-10 DIAGNOSIS — E039 Hypothyroidism, unspecified: Secondary | ICD-10-CM | POA: Diagnosis not present

## 2023-06-21 ENCOUNTER — Telehealth: Payer: Self-pay

## 2023-06-21 NOTE — Telephone Encounter (Signed)
-----   Message from Lonna Cobb sent at 06/17/2023  2:09 PM EDT ----- Please call Dr. Rudi Coco office, [facility].  Dr. Truett Perna would like Dr. Earlene Plater know he is out of town until Wednesday of next week and will call him when he returns.  Please find out the best number for him to reach Dr. Earlene Plater.

## 2023-06-21 NOTE — Telephone Encounter (Signed)
I spoke with a Elease Hashimoto, and she will rely the message to Dr. Earlene Plater.

## 2023-06-24 ENCOUNTER — Telehealth: Payer: Self-pay

## 2023-06-24 NOTE — Telephone Encounter (Signed)
-----   Message from Lonna Cobb sent at 06/17/2023  2:09 PM EDT ----- Please call Dr. Rudi Coco office, [facility].  Dr. Truett Perna would like Dr. Earlene Plater know he is out of town until Wednesday of next week and will call him when he returns.  Please find out the best number for him to reach Dr. Earlene Plater.

## 2023-06-27 ENCOUNTER — Other Ambulatory Visit: Payer: Medicare PPO

## 2023-06-29 ENCOUNTER — Encounter: Payer: Self-pay | Admitting: Nurse Practitioner

## 2023-06-29 ENCOUNTER — Inpatient Hospital Stay: Payer: Medicare PPO

## 2023-06-29 ENCOUNTER — Inpatient Hospital Stay: Payer: Medicare PPO | Attending: Nurse Practitioner | Admitting: Nurse Practitioner

## 2023-06-29 ENCOUNTER — Telehealth: Payer: Self-pay

## 2023-06-29 VITALS — BP 138/79 | HR 75 | Temp 97.5°F | Resp 18 | Ht 72.0 in | Wt 149.1 lb

## 2023-06-29 DIAGNOSIS — E039 Hypothyroidism, unspecified: Secondary | ICD-10-CM | POA: Insufficient documentation

## 2023-06-29 DIAGNOSIS — C679 Malignant neoplasm of bladder, unspecified: Secondary | ICD-10-CM

## 2023-06-29 DIAGNOSIS — Z8551 Personal history of malignant neoplasm of bladder: Secondary | ICD-10-CM | POA: Diagnosis not present

## 2023-06-29 DIAGNOSIS — G20A1 Parkinson's disease without dyskinesia, without mention of fluctuations: Secondary | ICD-10-CM | POA: Insufficient documentation

## 2023-06-29 DIAGNOSIS — Z923 Personal history of irradiation: Secondary | ICD-10-CM | POA: Insufficient documentation

## 2023-06-29 DIAGNOSIS — Z9221 Personal history of antineoplastic chemotherapy: Secondary | ICD-10-CM | POA: Diagnosis not present

## 2023-06-29 LAB — BASIC METABOLIC PANEL - CANCER CENTER ONLY
Anion gap: 7 (ref 5–15)
BUN: 23 mg/dL (ref 8–23)
CO2: 30 mmol/L (ref 22–32)
Calcium: 10.1 mg/dL (ref 8.9–10.3)
Chloride: 107 mmol/L (ref 98–111)
Creatinine: 1.47 mg/dL — ABNORMAL HIGH (ref 0.61–1.24)
GFR, Estimated: 46 mL/min — ABNORMAL LOW (ref >60)
Glucose, Bld: 95 mg/dL (ref 70–99)
Potassium: 5.1 mmol/L (ref 3.5–5.1)
Sodium: 144 mmol/L (ref 135–145)

## 2023-06-29 NOTE — Progress Notes (Unsigned)
  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  He saw Dr. Earlene Plater on 06/09/2023.  Cystoscopy showed a nodular lesion left anterior bladder wall, 2 other smaller lesions to the right.  No hematuria for the past 2 to 3 weeks.  He complains of frequent nighttime urination.  Appetite has been diminished for the past 3 to 4 months.  He is losing weight.  He denies pain.  Objective:  Vital signs in last 24 hours:  Blood pressure 138/79, pulse 75, temperature (!) 97.5 F (36.4 C), temperature source Oral, resp. rate 18, height 6' (1.829 m), weight 149 lb 1.4 oz (67.6 kg), SpO2 100%.    Lymphatics: No palpable cervical, supraclavicular, axillary or inguinal lymph nodes. Resp: Lungs clear bilaterally. Cardio: Regular rate and rhythm. GI: No hepatosplenomegaly. Vascular: No leg edema.   Lab Results:  Lab Results  Component Value Date   WBC 7.9 06/02/2023   HGB 14.3 06/02/2023   HCT 44.3 06/02/2023   MCV 96.3 06/02/2023   PLT 253 06/02/2023   NEUTROABS 5.3 06/02/2023    Imaging:  No results found.  Medications: I have reviewed the patient's current medications.  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  TURBT 07/18/2018.  Found to have high-grade invasive urothelial carcinoma; with suspicion for invasion into the muscularis propria, T2 lesion with grade 3 histology.  He completed radiation therapy with weekly carboplatin 09/04/2018 through 10/25/2018. Cystoscopy 06/09/2023-nodular lesion left anterior bladder wall, 2 other smaller lesions to the right Recent diagnosis of Parkinson's, followed by Dr. Arbutus Leas Hypothyroid  Disposition: Anthony Pacheco appears unchanged.  Dr. Truett Pacheco has spoken with Dr. Earlene Plater.  The plan is to obtain a biopsy to determine invasive versus noninvasive bladder cancer.  We are referring Anthony Pacheco  for baseline CT scans.  He will return for follow-up in 2 to 3 weeks.  We are available to see him sooner if needed.  Patient seen with Dr. Truett Pacheco.    Lonna Cobb ANP/GNP-BC   06/29/2023  11:43 AM  This was a shared visit with Lonna Cobb.  Anthony Pacheco has a history of invasive urothelial carcinoma.  He had recent hematuria.  I discussed the case with Dr. Earlene Plater.  He feels Anthony Pacheco most likely has recurrence of the invasive bladder cancer.  Dr. Earlene Plater will perform a cystoscopy and biopsy.  We will refer Anthony Pacheco for staging CTs.  He will return for an office visit after the cystoscopy and CTs to coordinate a treatment plan.  I was present for greater than 50% of today's visit.  I performed medical decision making.  Mancel Bale, MD

## 2023-06-29 NOTE — Telephone Encounter (Signed)
I contacted the office of Dr. Earlene Plater at [facility] to arrange an appointment for the patient. I spoke with Gunnar Fusi, who informed me that she will reach out to the patient to coordinate scheduling for the upcoming week.

## 2023-07-03 ENCOUNTER — Ambulatory Visit (HOSPITAL_BASED_OUTPATIENT_CLINIC_OR_DEPARTMENT_OTHER)
Admission: RE | Admit: 2023-07-03 | Discharge: 2023-07-03 | Disposition: A | Discharge status: Home / Self Care | Payer: Medicare PPO | Source: Ambulatory Visit | Attending: Nurse Practitioner | Admitting: Nurse Practitioner

## 2023-07-03 DIAGNOSIS — K8020 Calculus of gallbladder without cholecystitis without obstruction: Secondary | ICD-10-CM | POA: Diagnosis not present

## 2023-07-03 DIAGNOSIS — C679 Malignant neoplasm of bladder, unspecified: Secondary | ICD-10-CM | POA: Insufficient documentation

## 2023-07-03 DIAGNOSIS — C801 Malignant (primary) neoplasm, unspecified: Secondary | ICD-10-CM | POA: Diagnosis not present

## 2023-07-04 DIAGNOSIS — N35014 Post-traumatic urethral stricture, male, unspecified: Secondary | ICD-10-CM | POA: Diagnosis not present

## 2023-07-04 DIAGNOSIS — C672 Malignant neoplasm of lateral wall of bladder: Secondary | ICD-10-CM | POA: Diagnosis not present

## 2023-07-12 ENCOUNTER — Inpatient Hospital Stay: Payer: Medicare PPO | Admitting: Nurse Practitioner

## 2023-07-13 ENCOUNTER — Telehealth: Payer: Self-pay

## 2023-07-13 NOTE — Telephone Encounter (Signed)
He is schedule on 08/30/2023.

## 2023-07-13 NOTE — Telephone Encounter (Signed)
-----   Message from Lonna Cobb sent at 07/11/2023 12:49 PM EDT ----- Please follow-up with Dr Earlene Plater office and find out when the cystoscopy is planned.  Thanks ----- Message ----- From: Dimitri Ped, LPN Sent: 16/06/9603   3:14 PM EDT To: Rana Snare, NP  The patient reported that he visited Dr. Jinny Sanders office yesterday but did not have an appointment with him; instead, he was seen by a different provider. He is unsure of that provider's name and is uncertain when the cystoscopy will be scheduled. ----- Message ----- From: Rana Snare, NP Sent: 07/08/2023  12:10 PM EDT To: [facility]  Please call him.  Has he seen Dr. Earlene Plater and had the repeat cystoscopy yet?  If not, when is he scheduled to do this.

## 2023-07-13 NOTE — Telephone Encounter (Signed)
I contacted Dr. Jinny Sanders office and spoke with [facility] from [facility] regarding a request to be transferred to his nurse. She informed me that the nurse was not available at that time but assured me that she would prioritize my request for the nurse to return my call concerning the patient's schedule, aiming for an appointment earlier than August 30, 2023.

## 2023-07-14 ENCOUNTER — Telehealth: Payer: Self-pay

## 2023-07-14 NOTE — Telephone Encounter (Signed)
Anthony Pacheco from [facility] contacted [place] to inform that the patient's surgery has been scheduled for July 26, 2023.

## 2023-07-20 DIAGNOSIS — C672 Malignant neoplasm of lateral wall of bladder: Secondary | ICD-10-CM | POA: Diagnosis not present

## 2023-07-20 DIAGNOSIS — N35811 Other urethral stricture, male, meatal: Secondary | ICD-10-CM | POA: Diagnosis not present

## 2023-07-20 DIAGNOSIS — Z79899 Other long term (current) drug therapy: Secondary | ICD-10-CM | POA: Diagnosis not present

## 2023-07-20 DIAGNOSIS — G20A1 Parkinson's disease without dyskinesia, without mention of fluctuations: Secondary | ICD-10-CM | POA: Diagnosis not present

## 2023-07-26 ENCOUNTER — Telehealth: Payer: Self-pay

## 2023-07-26 DIAGNOSIS — C678 Malignant neoplasm of overlapping sites of bladder: Secondary | ICD-10-CM | POA: Diagnosis not present

## 2023-07-26 DIAGNOSIS — N35811 Other urethral stricture, male, meatal: Secondary | ICD-10-CM | POA: Diagnosis not present

## 2023-07-26 DIAGNOSIS — Z79899 Other long term (current) drug therapy: Secondary | ICD-10-CM | POA: Diagnosis not present

## 2023-07-26 DIAGNOSIS — C672 Malignant neoplasm of lateral wall of bladder: Secondary | ICD-10-CM | POA: Diagnosis not present

## 2023-07-26 DIAGNOSIS — G20A1 Parkinson's disease without dyskinesia, without mention of fluctuations: Secondary | ICD-10-CM | POA: Diagnosis not present

## 2023-07-26 DIAGNOSIS — D414 Neoplasm of uncertain behavior of bladder: Secondary | ICD-10-CM | POA: Diagnosis not present

## 2023-07-26 NOTE — Telephone Encounter (Signed)
Patient called and left voicemail to cancel appt on 07/29/23 due to having surgery per Eber Shubham Thackston. Appointment has been cancelled and a voicemail was left for patient to call back to reschedule on 07/26/23 @11 :59AM

## 2023-07-27 DIAGNOSIS — Z79899 Other long term (current) drug therapy: Secondary | ICD-10-CM | POA: Diagnosis not present

## 2023-07-27 DIAGNOSIS — G20A1 Parkinson's disease without dyskinesia, without mention of fluctuations: Secondary | ICD-10-CM | POA: Diagnosis not present

## 2023-07-27 DIAGNOSIS — N35811 Other urethral stricture, male, meatal: Secondary | ICD-10-CM | POA: Diagnosis not present

## 2023-07-27 DIAGNOSIS — C672 Malignant neoplasm of lateral wall of bladder: Secondary | ICD-10-CM | POA: Diagnosis not present

## 2023-07-28 NOTE — Progress Notes (Deleted)
Assessment/Plan:   1.  Parkinsons Disease, diagnosed November, 2021  -Continue carbidopa/levodopa 25/100, 2/1/1.  May need to increase further but will determine that next visit.  -We discussed that it used to be thought that levodopa would increase risk of melanoma but now it is believed that Parkinsons itself likely increases risk of melanoma. he is to get regular skin checks.  We discussed this again today as he doesn't do this.  Resources given.   2.  History of bladder cancer  -Doing well.  He was found to have bladder cancer in 2019 and had nodular lesions recur in the bladder wall in 2024 with recent biopsy in 06/2023  Subjective:   Anthony Pacheco was seen today in follow up for newly diagnosed Parkinsons disease.  My previous records were reviewed prior to todays visit as well as outside records available to me.  Last visit we just slightly increased his levodopa, and told him we may do it again this visit since it was such a small increase.  He reports he tolerated it well.  He has had no falls.  No hallucinations.  No lightheadedness or near syncope.  Current prescribed movement disorder medications: Carbidopa/levodopa 25/100, 2/1/1   PREVIOUS MEDICATIONS: Sinemet  ALLERGIES:  No Known Allergies   Objective:   PHYSICAL EXAMINATION:    VITALS:   There were no vitals filed for this visit.       GEN:  The patient appears stated age and is in NAD. HEENT:  Normocephalic, atraumatic.  The mucous membranes are moist. The superficial temporal arteries are without ropiness or tenderness. CV:  RRR Lungs:  CTAB Neck/HEME:  There are no carotid bruits bilaterally.  Neurological examination:  Orientation: The patient is alert and oriented x3. Cranial nerves: There is good facial symmetry with min facial hypomimia. The speech is fluent and clear. Soft palate rises symmetrically and there is no tongue deviation. Hearing is intact to conversational tone. Sensation:  Sensation is intact to light touch throughout Motor: Strength is at least antigravity x4.  Movement examination: Tone: There is min increased tone in the LUE (improved) Abnormal movements: there is LUE rest tremor that increases with distraction.   Coordination:  There is normal decremation with any form of RAMS, including alternating supination and pronation of the forearm, hand opening and closing, finger taps, heel taps and toe taps.  Gait and Station: The patient has no difficulty arising out of a deep-seated chair without the use of the hands. The patient ambulates well today with slightly decreased arm swing   I have reviewed and interpreted the following labs independently    Chemistry      Component Value Date/Time   NA 144 06/29/2023 1216   K 5.1 06/29/2023 1216   CL 107 06/29/2023 1216   CO2 30 06/29/2023 1216   BUN 23 06/29/2023 1216   CREATININE 1.47 (H) 06/29/2023 1216      Component Value Date/Time   CALCIUM 10.1 06/29/2023 1216   ALKPHOS 51 06/02/2023 1453   AST 14 (L) 06/02/2023 1453   ALT 5 06/02/2023 1453   BILITOT 0.5 06/02/2023 1453       Lab Results  Component Value Date   WBC 7.9 06/02/2023   HGB 14.3 06/02/2023   HCT 44.3 06/02/2023   MCV 96.3 06/02/2023   PLT 253 06/02/2023    No results found for: "TSH"  Total time spent on today's visit was *** minutes, including both face-to-face time and nonface-to-face time.  Time included that spent on review of records (prior notes available to me/labs/imaging if pertinent), discussing treatment and goals, answering patient's questions and coordinating care.   Cc:  Clovis Riley, L.August Saucer, MD (Inactive)

## 2023-07-29 ENCOUNTER — Inpatient Hospital Stay: Payer: Medicare PPO | Admitting: Nurse Practitioner

## 2023-08-02 ENCOUNTER — Ambulatory Visit: Payer: Medicare PPO | Admitting: Neurology

## 2023-08-02 ENCOUNTER — Inpatient Hospital Stay: Payer: Medicare PPO | Attending: Nurse Practitioner | Admitting: Nurse Practitioner

## 2023-08-02 ENCOUNTER — Encounter: Payer: Self-pay | Admitting: Nurse Practitioner

## 2023-08-02 ENCOUNTER — Encounter: Payer: Self-pay | Admitting: *Deleted

## 2023-08-02 VITALS — BP 142/74 | HR 99 | Temp 98.2°F | Resp 18 | Ht 72.0 in | Wt 150.5 lb

## 2023-08-02 DIAGNOSIS — Z923 Personal history of irradiation: Secondary | ICD-10-CM | POA: Diagnosis not present

## 2023-08-02 DIAGNOSIS — R350 Frequency of micturition: Secondary | ICD-10-CM | POA: Insufficient documentation

## 2023-08-02 DIAGNOSIS — G20A1 Parkinson's disease without dyskinesia, without mention of fluctuations: Secondary | ICD-10-CM | POA: Insufficient documentation

## 2023-08-02 DIAGNOSIS — R300 Dysuria: Secondary | ICD-10-CM | POA: Insufficient documentation

## 2023-08-02 DIAGNOSIS — Z8551 Personal history of malignant neoplasm of bladder: Secondary | ICD-10-CM | POA: Diagnosis not present

## 2023-08-02 DIAGNOSIS — E039 Hypothyroidism, unspecified: Secondary | ICD-10-CM | POA: Insufficient documentation

## 2023-08-02 DIAGNOSIS — C679 Malignant neoplasm of bladder, unspecified: Secondary | ICD-10-CM

## 2023-08-02 DIAGNOSIS — Z9221 Personal history of antineoplastic chemotherapy: Secondary | ICD-10-CM | POA: Insufficient documentation

## 2023-08-02 NOTE — Progress Notes (Signed)
[facility] Pathology to request PDL1 testing on bladder tumor from procedure 07/26/2023 per request of Lonna Cobb, NP.  I spoke with April who said she would notify Dr. Elnoria Howard, Pathologist and request the additional testing.

## 2023-08-02 NOTE — Progress Notes (Signed)
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  No recent hematuria.  He underwent a cystectomy/TURBT procedure 07/26/2023.  He has an indwelling urinary catheter.  He thinks this will be removed tomorrow.  No hematuria.  Stable appetite.  Objective:  Vital signs in last 24 hours:  Blood pressure (!) 142/74, pulse 99, temperature 98.2 F (36.8 C), temperature source Oral, resp. rate 18, height 6' (1.829 m), weight 150 lb 8 oz (68.3 kg), SpO2 100%.    Resp: Distant breath sounds.  No respiratory distress. Cardio: Regular rate and rhythm. GI: No hepatosplenomegaly. Vascular: No leg edema.   Lab Results:  Lab Results  Component Value Date   WBC 7.9 06/02/2023   HGB 14.3 06/02/2023   HCT 44.3 06/02/2023   MCV 96.3 06/02/2023   PLT 253 06/02/2023   NEUTROABS 5.3 06/02/2023    Imaging:  No results found.  Medications: I have reviewed the patient's current medications.  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  TURBT 07/18/2018.  Found to have high-grade invasive urothelial carcinoma; with suspicion for invasion into the muscularis propria, T2 lesion with grade 3 histology.  He completed radiation therapy with weekly carboplatin 09/04/2018 through 10/25/2018. Cystoscopy 06/09/2023-nodular lesion left anterior bladder wall, 2 other smaller lesions to the right CTs 07/03/2023-negative for metastatic disease Cystoscopy/TURBT 07/26/2023-scattered patches of sessile appearing tumors with associated dystrophic calcifications along the left anterior bladder wall and dome; additional small patches of sessile appearing tumor along the posterior bladder wall left greater than right and right anterior bladder wall; entirety of the left anterior sessile tumor was resected, remainder of the tumors were cauterized,  pathology-high-grade invasive urothelial carcinoma, muscularis propria not present, lymphovascular invasion not identified, tumor invades at least lamina propria. Recent diagnosis of Parkinson's, followed by Dr. Arbutus Leas Hypothyroid  Disposition: Anthony Pacheco appears stable.  We reviewed the results of the CT scan completed 07/03/2023 and results of the cystoscopy/TURBT 07/26/2023.  We discussed different options that could be considered including observation, cystectomy, chemo/radiation (he may not be a candidate for additional radiation), systemic therapy (chemotherapy, immunotherapy).  He indicates that he is not interested in cystectomy.  We have reached out to radiation oncology regarding the possibility of additional radiation.  We have requested PD-L1 testing on the bladder tumor from 07/26/2023.  He will return for additional discussion on 08/15/2023.  Patient seen with Dr. Truett Perna.    Lonna Cobb ANP/GNP-BC   08/02/2023  12:13 PM This was a shared visit with Lonna Cobb.  Anthony Pacheco has been diagnosed with recurrent bladder cancer.  The dominant lesion was resected and others were cauterized.  The pathology reveals high-grade urothelial carcinoma with at least lamina propria invasion.  We discussed treatment options with Mr. Tippet.  He appears to have a recurrence of high-grade urothelial carcinoma with at least T1 disease.  There is no evidence of distant metastatic disease.  He indicates he does not wish to consider a cystectomy.  I discussed the case with radiation oncology.  They feel he is not a candidate for further radiation.  We will recommend single agent pembrolizumab if Dr. Earlene Plater is in agreement.  We will check a PD-L1 score and he will return for further discussion in 2 weeks.  I was present for greater than 50% of today's visit.  I performed medical decision making.  Mancel Bale, MD

## 2023-08-03 DIAGNOSIS — R339 Retention of urine, unspecified: Secondary | ICD-10-CM | POA: Diagnosis not present

## 2023-08-15 ENCOUNTER — Encounter: Payer: Self-pay | Admitting: Nurse Practitioner

## 2023-08-15 ENCOUNTER — Inpatient Hospital Stay: Payer: Medicare PPO | Admitting: Nurse Practitioner

## 2023-08-15 ENCOUNTER — Inpatient Hospital Stay: Payer: Medicare PPO

## 2023-08-15 VITALS — BP 114/60 | HR 75 | Temp 97.7°F | Resp 18 | Ht 72.0 in | Wt 149.1 lb

## 2023-08-15 DIAGNOSIS — C679 Malignant neoplasm of bladder, unspecified: Secondary | ICD-10-CM | POA: Diagnosis not present

## 2023-08-15 DIAGNOSIS — Z923 Personal history of irradiation: Secondary | ICD-10-CM | POA: Diagnosis not present

## 2023-08-15 DIAGNOSIS — G20A1 Parkinson's disease without dyskinesia, without mention of fluctuations: Secondary | ICD-10-CM | POA: Diagnosis not present

## 2023-08-15 DIAGNOSIS — Z8551 Personal history of malignant neoplasm of bladder: Secondary | ICD-10-CM | POA: Diagnosis not present

## 2023-08-15 DIAGNOSIS — R300 Dysuria: Secondary | ICD-10-CM | POA: Diagnosis not present

## 2023-08-15 DIAGNOSIS — E039 Hypothyroidism, unspecified: Secondary | ICD-10-CM | POA: Diagnosis not present

## 2023-08-15 DIAGNOSIS — Z9221 Personal history of antineoplastic chemotherapy: Secondary | ICD-10-CM | POA: Diagnosis not present

## 2023-08-15 DIAGNOSIS — R350 Frequency of micturition: Secondary | ICD-10-CM | POA: Diagnosis not present

## 2023-08-15 LAB — URINALYSIS, COMPLETE (UACMP) WITH MICROSCOPIC
Bilirubin Urine: NEGATIVE
Glucose, UA: NEGATIVE mg/dL
Nitrite: NEGATIVE
Protein, ur: 100 mg/dL — AB
RBC / HPF: >50 RBC/hpf (ref 0–5)
Specific Gravity, Urine: 1.022 (ref 1.005–1.030)
WBC, UA: >50 WBC/hpf (ref 0–5)
pH: 5.5 (ref 5.0–8.0)

## 2023-08-15 MED ORDER — CIPROFLOXACIN HCL 500 MG PO TABS: 500.0000 mg | ORAL_TABLET | Freq: Two times a day (BID) | ORAL | 0 refills | Status: AC

## 2023-08-15 NOTE — Progress Notes (Unsigned)
  [facility] OFFICE PROGRESS NOTE   Diagnosis: Bladder cancer  INTERVAL HISTORY:   Anthony Pacheco returns as scheduled.  He reports frequent urination, intermittent dysuria.  No hematuria.  He estimates the urge to void every 30 minutes to 1 hour.  No fever.  Stable appetite.  Objective:  Vital signs in last 24 hours:  Blood pressure 114/60, pulse 75, temperature 97.7 F (36.5 C), temperature source Temporal, resp. rate 18, height 6' (1.829 m), weight 149 lb 1.6 oz (67.6 kg), SpO2 98%.     Resp: Distant breath sounds. Cardio: Regular rate and rhythm. GI: No hepatosplenomegaly. Vascular: No leg edema.   Lab Results:  Lab Results  Component Value Date   WBC 7.9 06/02/2023   HGB 14.3 06/02/2023   HCT 44.3 06/02/2023   MCV 96.3 06/02/2023   PLT 253 06/02/2023   NEUTROABS 5.3 06/02/2023    Imaging:  No results found.  Medications: I have reviewed the patient's current medications.  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  TURBT 07/18/2018.  Found to have high-grade invasive urothelial carcinoma; with suspicion for invasion into the muscularis propria, T2 lesion with grade 3 histology.  He completed radiation therapy with weekly carboplatin 09/04/2018 through 10/25/2018. Cystoscopy 06/09/2023-nodular lesion left anterior bladder wall, 2 other smaller lesions to the right CTs 07/03/2023-negative for metastatic disease Cystoscopy/TURBT 07/26/2023-scattered patches of sessile appearing tumors with associated dystrophic calcifications along the left anterior bladder wall and dome; additional small patches of sessile appearing tumor along the posterior bladder wall left greater than right and right anterior bladder wall; entirety of the left anterior sessile tumor was resected, remainder of the tumors were cauterized, pathology-high-grade invasive urothelial carcinoma, muscularis propria not present, lymphovascular invasion not identified, tumor  invades at least lamina propria.  PD-L1 CPS 3 Recent diagnosis of Parkinson's, followed by Anthony Pacheco Hypothyroid    Disposition: Anthony Pacheco appears unchanged.  We again reviewed the pathology report from 07/26/2023.  He understands he is not a candidate for additional radiation.  He declines cystectomy.  We discussed adjuvant chemotherapy, immunotherapy, observation.  He was provided with printed information on the chemotherapy.  He understands a Port-A-Cath will be needed if the decision is chemotherapy.  We have placed a call to Anthony Pacheco to discuss his case.  We reviewed the urinalysis from today, consistent with infection.  He will complete a course of ciprofloxacin.  We discussed C. difficile and tendon rupture associated with Cipro.  He agrees to proceed.  He will return for follow-up in 2 weeks.  We are available to see him sooner if needed.  Patient seen with Dr. Truett Pacheco.    Anthony Pacheco ANP/GNP-BC   08/15/2023  8:10 AM  This was a shared visit with Anthony Pacheco.  Anthony Pacheco appears to have a urinary tract infection.  He will begin antibiotic therapy today.  We will check a urine culture.  Anthony Pacheco does not wish to undergo a cystectomy.  He is not a candidate for cisplatin due to renal insufficiency.  I discussed the case with Anthony Pacheco.  He feels Anthony Pacheco as invasive cancer and is at high risk for relapse.  I we will recommended adjuvant pembrolizumab.  We will ask Anthony Pacheco to return for further discussion after he completes a course of antibiotics.  I was present for greater than 50% of today's visit.  I performed medical decision making.  Anthony Bale, MD

## 2023-08-16 LAB — URINE CULTURE: Culture: <10000 — AB

## 2023-08-17 ENCOUNTER — Telehealth: Payer: Self-pay | Admitting: Nurse Practitioner

## 2023-08-17 DIAGNOSIS — E039 Hypothyroidism, unspecified: Secondary | ICD-10-CM | POA: Diagnosis not present

## 2023-08-17 NOTE — Telephone Encounter (Signed)
I contacted Anthony Pacheco to review Dr. Kalman Drape recommendation for immunotherapy with pembrolizumab.  We discussed various potential side effects including diarrhea/colitis, rash, pneumonitis, hepatitis, neurologic toxicities, endocrinopathies, myocarditis, arthritis.  We will send him information through the mail as well.  He indicates interest in beginning immunotherapy.  He will return as scheduled on 08/29/2023 for additional discussion, possible treatment.

## 2023-08-18 ENCOUNTER — Telehealth: Payer: Self-pay

## 2023-08-18 ENCOUNTER — Encounter: Payer: Self-pay | Admitting: Oncology

## 2023-08-18 NOTE — Addendum Note (Signed)
Addended by: Thornton Papas B on: 08/18/2023 08:05 AM   Modules accepted: Orders

## 2023-08-18 NOTE — Progress Notes (Signed)
DISCONTINUE OFF PATHWAY REGIMEN - Bladder   [id number]:Carboplatin AUC=2:   Administer weekly:     Carboplatin   **Always confirm dose/schedule in your pharmacy ordering system**  REASON: Other Reason PRIOR TREATMENT: Off Pathway: Carboplatin AUC=2 TREATMENT RESPONSE: Unable to Evaluate  START OFF PATHWAY REGIMEN - Bladder   [id number]:Pembrolizumab 200 mg IV D1 q21 Days:   A cycle is every 21 days:     Pembrolizumab   **Always confirm dose/schedule in your pharmacy ordering system**  Patient Characteristics: Pre-Cystectomy or Nonsurgical Candidate, M0 (Clinical Staging), cT2-4a, cN0-1, M0, Cystectomy Ineligible Therapeutic Status: Pre-Cystectomy or Nonsurgical Candidate, M0 (Clinical Staging) AJCC M Category: cM0 AJCC 8 Stage Grouping: Unknown AJCC T Category: cTX AJCC N Category: cN0 Intent of Therapy: Curative Intent, Discussed with Patient

## 2023-08-18 NOTE — Telephone Encounter (Signed)
I mailed  an information sheet on Pembrolizumab to the patient.

## 2023-08-19 ENCOUNTER — Other Ambulatory Visit: Payer: Self-pay

## 2023-08-23 NOTE — Progress Notes (Signed)
Pharmacist Chemotherapy Monitoring - Initial Assessment    Anticipated start date: 08/29/23   The following has been reviewed per standard work regarding the patient's treatment regimen: The patient's diagnosis, treatment plan and drug doses, and organ/hematologic function Lab orders and baseline tests specific to treatment regimen  The treatment plan start date, drug sequencing, and pre-medications Prior authorization status  Patient's documented medication list, including drug-drug interaction screen and prescriptions for anti-emetics and supportive care specific to the treatment regimen The drug concentrations, fluid compatibility, administration routes, and timing of the medications to be used The patient's access for treatment and lifetime cumulative dose history, if applicable  The patient's medication allergies and previous infusion related reactions, if applicable   Changes made to treatment plan:  N/A  Follow up needed:  N/A   Daylene Katayama, RPH, 08/23/2023  11:30 AM

## 2023-08-27 ENCOUNTER — Other Ambulatory Visit: Payer: Self-pay | Admitting: Oncology

## 2023-08-29 ENCOUNTER — Inpatient Hospital Stay: Payer: Medicare PPO

## 2023-08-29 ENCOUNTER — Encounter: Payer: Self-pay | Admitting: Nurse Practitioner

## 2023-08-29 ENCOUNTER — Inpatient Hospital Stay: Payer: Medicare PPO | Attending: Nurse Practitioner | Admitting: Nurse Practitioner

## 2023-08-29 VITALS — BP 122/63 | HR 87 | Temp 98.2°F | Resp 18 | Ht 73.0 in | Wt 152.6 lb

## 2023-08-29 VITALS — BP 142/70 | HR 66 | Resp 18

## 2023-08-29 DIAGNOSIS — C679 Malignant neoplasm of bladder, unspecified: Secondary | ICD-10-CM | POA: Diagnosis present

## 2023-08-29 DIAGNOSIS — E039 Hypothyroidism, unspecified: Secondary | ICD-10-CM | POA: Diagnosis not present

## 2023-08-29 DIAGNOSIS — Z79899 Other long term (current) drug therapy: Secondary | ICD-10-CM | POA: Diagnosis not present

## 2023-08-29 DIAGNOSIS — Z5112 Encounter for antineoplastic immunotherapy: Secondary | ICD-10-CM | POA: Diagnosis not present

## 2023-08-29 LAB — CBC WITH DIFFERENTIAL (CANCER CENTER ONLY)
Abs Immature Granulocytes: 0.03 10*3/uL (ref 0.00–0.07)
Basophils Absolute: 0.1 10*3/uL (ref 0.0–0.1)
Basophils Relative: 1 %
Eosinophils Absolute: 0.1 10*3/uL (ref 0.0–0.5)
Eosinophils Relative: 2 %
HCT: 40.5 % (ref 39.0–52.0)
Hemoglobin: 12.9 g/dL — ABNORMAL LOW (ref 13.0–17.0)
Immature Granulocytes: 0 %
Lymphocytes Relative: 21 %
Lymphs Abs: 1.4 10*3/uL (ref 0.7–4.0)
MCH: 30.6 pg (ref 26.0–34.0)
MCHC: 31.9 g/dL (ref 30.0–36.0)
MCV: 96.2 fL (ref 80.0–100.0)
Monocytes Absolute: 0.4 10*3/uL (ref 0.1–1.0)
Monocytes Relative: 6 %
Neutro Abs: 4.6 10*3/uL (ref 1.7–7.7)
Neutrophils Relative %: 70 %
Platelet Count: 210 10*3/uL (ref 150–400)
RBC: 4.21 MIL/uL — ABNORMAL LOW (ref 4.22–5.81)
RDW: 13 % (ref 11.5–15.5)
WBC Count: 6.7 10*3/uL (ref 4.0–10.5)
nRBC: 0 % (ref 0.0–0.2)

## 2023-08-29 LAB — CMP (CANCER CENTER ONLY)
ALT: <5 U/L (ref 0–44)
AST: 12 U/L — ABNORMAL LOW (ref 15–41)
Albumin: 4 g/dL (ref 3.5–5.0)
Alkaline Phosphatase: 45 U/L (ref 38–126)
Anion gap: 7 (ref 5–15)
BUN: 20 mg/dL (ref 8–23)
CO2: 27 mmol/L (ref 22–32)
Calcium: 9 mg/dL (ref 8.9–10.3)
Chloride: 110 mmol/L (ref 98–111)
Creatinine: 1.41 mg/dL — ABNORMAL HIGH (ref 0.61–1.24)
GFR, Estimated: 49 mL/min — ABNORMAL LOW (ref >60)
Glucose, Bld: 94 mg/dL (ref 70–99)
Potassium: 3.8 mmol/L (ref 3.5–5.1)
Sodium: 144 mmol/L (ref 135–145)
Total Bilirubin: 0.5 mg/dL (ref <1.2)
Total Protein: 6.3 g/dL — ABNORMAL LOW (ref 6.5–8.1)

## 2023-08-29 LAB — TSH: TSH: 1.018 u[IU]/mL (ref 0.350–4.500)

## 2023-08-29 MED ORDER — SODIUM CHLORIDE 0.9 % IV SOLN
200.0000 mg | Freq: Once | INTRAVENOUS | Status: AC
  Administered 2023-08-29: 200 mg via INTRAVENOUS
  Filled 2023-08-29: qty 8

## 2023-08-29 MED ORDER — SODIUM CHLORIDE 0.9 % IV SOLN: INTRAVENOUS | Status: DC

## 2023-08-29 NOTE — Progress Notes (Unsigned)
[facility] OFFICE PROGRESS NOTE   Diagnosis: Bladder cancer  INTERVAL HISTORY:   Anthony Pacheco returns as scheduled.  He reports recent constipation.  He estimates last bowel movement was 2 weeks ago.  He is passing gas.  No nausea or vomiting.  Over the past 2 weeks he has taken 3 doses of MiraLAX.  He continues to have frequent urination.  He noted no improvement following the recent antibiotic.  Objective:  Vital signs in last 24 hours:  Blood pressure 122/63, pulse 87, temperature 98.2 F (36.8 C), temperature source Temporal, resp. rate 18, height 6\' 1"  (1.854 m), weight 152 lb 9.6 oz (69.2 kg), SpO2 100%.    HEENT: No thrush or ulcers. Resp: Lungs clear bilaterally. Cardio: Regular rate and rhythm. GI: Abdomen soft and nontender.  No hepatosplenomegaly. Vascular: No leg edema.   Lab Results:  Lab Results  Component Value Date   WBC 6.7 08/29/2023   HGB 12.9 (L) 08/29/2023   HCT 40.5 08/29/2023   MCV 96.2 08/29/2023   PLT 210 08/29/2023   NEUTROABS 4.6 08/29/2023    Imaging:  No results found.  Medications: I have reviewed the patient's current medications.  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  TURBT 07/18/2018.  Found to have high-grade invasive urothelial carcinoma; with suspicion for invasion into the muscularis propria, T2 lesion with grade 3 histology.  He completed radiation therapy with weekly carboplatin 09/04/2018 through 10/25/2018. Cystoscopy 06/09/2023-nodular lesion left anterior bladder wall, 2 other smaller lesions to the right CTs 07/03/2023-negative for metastatic disease Cystoscopy/TURBT 07/26/2023-scattered patches of sessile appearing tumors with associated dystrophic calcifications along the left anterior bladder wall and dome; additional small patches of sessile appearing tumor along the posterior bladder wall left greater than right and right anterior bladder wall; entirety of the left anterior  sessile tumor was resected, remainder of the tumors were cauterized, pathology-high-grade invasive urothelial carcinoma, muscularis propria not present, lymphovascular invasion not identified, tumor invades at least lamina propria.  PD-L1 CPS 3 Cycle 1 Pembrolizumab 08/29/2023 Recent diagnosis of Parkinson's, followed by Dr. Arbutus Leas Hypothyroid  Disposition: Anthony Pacheco appears stable.  He is scheduled to begin treatment today with adjuvant Pembrolizumab.  We again reviewed potential toxicities.  He agrees to proceed.  Plan to proceed with cycle 1 today as scheduled.  CBC and chemistry panel reviewed.  Labs adequate to proceed as above.  For constipation he will take MiraLAX twice a day for the next 2 to 3 days.  He will contact the office if he does not have a bowel movement.  He will return for follow-up and cycle 2 Pembrolizumab in 3 weeks.  We are available to see him sooner if needed.  Patient seen with Dr. Truett Perna.  Lonna Cobb ANP/GNP-BC   08/29/2023  9:06 AM  This was a shared visit with Lonna Cobb.  We discussed the indication for "adjuvant "therapy with Anthony Pacheco.  He does not wish to undergo a cystectomy.  He previously received bladder radiation.  I discussed the case with Dr. Earlene Plater.  He feels Anthony Pacheco has a more deeply invasive cancer.  Muscle was not present on the recent biopsy.  He is at high risk for developing recurrent disease.  We discussed observation versus a trial of immunotherapy.  He understands immunotherapy is generally given in the adjuvant setting in patients who have undergone a cystectomy, or for treatment of metastatic disease.  We reviewed potential toxicities associated with pembrolizumab.  He would like  to proceed with treatment.  We will asked Dr. Earlene Plater to perform a repeat cystoscopy after several months of pembrolizumab therapy.  I was present for greater than 50% of today's visit.  I performed medical decision making.  Mancel Bale, MD

## 2023-08-29 NOTE — Patient Instructions (Signed)
 [facility] - A DEPT OF [facility]  Discharge Instructions: Thank you for choosing [facility] to provide your oncology and hematology care.   If you have a lab appointment with the Cancer Center, please go directly to the Cancer Center and check in at the registration area.   Wear comfortable clothing and clothing appropriate for easy access to any Portacath or PICC line.   We strive to give you quality time with your provider. You may need to reschedule your appointment if you arrive late (15 or more minutes).  Arriving late affects you and other patients whose appointments are after yours.  Also, if you miss three or more appointments without notifying the office, you may be dismissed from the clinic at the provider's discretion.      For prescription refill requests, have your pharmacy contact our office and allow 72 hours for refills to be completed.    Today you received the following chemotherapy and/or immunotherapy agents: pembrolizumab      To help prevent nausea and vomiting after your treatment, we encourage you to take your nausea medication as directed.  BELOW ARE SYMPTOMS THAT SHOULD BE REPORTED IMMEDIATELY: *FEVER GREATER THAN 100.4 F (38 C) OR HIGHER *CHILLS OR SWEATING *NAUSEA AND VOMITING THAT IS NOT CONTROLLED WITH YOUR NAUSEA MEDICATION *UNUSUAL SHORTNESS OF BREATH *UNUSUAL BRUISING OR BLEEDING *URINARY PROBLEMS (pain or burning when urinating, or frequent urination) *BOWEL PROBLEMS (unusual diarrhea, constipation, pain near the anus) TENDERNESS IN MOUTH AND THROAT WITH OR WITHOUT PRESENCE OF ULCERS (sore throat, sores in mouth, or a toothache) UNUSUAL RASH, SWELLING OR PAIN  UNUSUAL VAGINAL DISCHARGE OR ITCHING   Items with * indicate a potential emergency and should be followed up as soon as possible or go to the Emergency Department if any problems should occur.  Please show the CHEMOTHERAPY ALERT CARD or  IMMUNOTHERAPY ALERT CARD at check-in to the Emergency Department and triage nurse.  Should you have questions after your visit or need to cancel or reschedule your appointment, please contact [facility] - A DEPT OF [facility]  Dept: [phone]  and follow the prompts.  Office hours are 8:00 a.m. to 4:30 p.m. Monday - Friday. Please note that voicemails left after 4:00 p.m. may not be returned until the following business day.  We are closed weekends and major holidays. You have access to a nurse at all times for urgent questions. Please call the main number to the clinic Dept: [phone] and follow the prompts.   For any non-urgent questions, you may also contact your provider using MyChart. We now offer e-Visits for anyone 53 and older to request care online for non-urgent symptoms. For details visit [email or web address].   Also download the MyChart app! Go to the app store, search "MyChart", open the app, select [facility], and log in with your MyChart username and password.  Pembrolizumab Injection What is this medication? PEMBROLIZUMAB (PEM broe LIZ ue mab) treats some types of cancer. It works by helping your immune system slow or stop the spread of cancer cells. It is a monoclonal antibody. This medicine may be used for other purposes; ask your health care provider or pharmacist if you have questions. COMMON BRAND NAME(S): Keytruda What should I tell my care team before I take this medication? They need to know if you have any of these conditions: Allogeneic stem cell transplant (uses someone else's stem cells) Autoimmune diseases, such as  Crohn disease, ulcerative colitis, lupus History of chest radiation Nervous system problems, such as Guillain-Barre syndrome, myasthenia gravis Organ transplant An unusual or allergic reaction to pembrolizumab, other medications, foods, dyes, or preservatives Pregnant or trying to get  pregnant Breast-feeding How should I use this medication? This medication is injected into a vein. It is given by your care team in a hospital or clinic setting. A special MedGuide will be given to you before each treatment. Be sure to read this information carefully each time. Talk to your care team about the use of this medication in children. While it may be prescribed for children as young as 6 months for selected conditions, precautions do apply. Overdosage: If you think you have taken too much of this medicine contact a poison control center or emergency room at once. NOTE: This medicine is only for you. Do not share this medicine with others. What if I miss a dose? Keep appointments for follow-up doses. It is important not to miss your dose. Call your care team if you are unable to keep an appointment. What may interact with this medication? Interactions have not been studied. This list may not describe all possible interactions. Give your health care provider a list of all the medicines, herbs, non-prescription drugs, or dietary supplements you use. Also tell them if you smoke, drink alcohol, or use illegal drugs. Some items may interact with your medicine. What should I watch for while using this medication? Your condition will be monitored carefully while you are receiving this medication. You may need blood work while taking this medication. This medication may cause serious skin reactions. They can happen weeks to months after starting the medication. Contact your care team right away if you notice fevers or flu-like symptoms with a rash. The rash may be red or purple and then turn into blisters or peeling of the skin. You may also notice a red rash with swelling of the face, lips, or lymph nodes in your neck or under your arms. Tell your care team right away if you have any change in your eyesight. Talk to your care team if you may be pregnant. Serious birth defects can occur if you  take this medication during pregnancy and for 4 months after the last dose. You will need a negative pregnancy test before starting this medication. Contraception is recommended while taking this medication and for 4 months after the last dose. Your care team can help you find the option that works for you. Do not breastfeed while taking this medication and for 4 months after the last dose. What side effects may I notice from receiving this medication? Side effects that you should report to your care team as soon as possible: Allergic reactions--skin rash, itching, hives, swelling of the face, lips, tongue, or throat Dry cough, shortness of breath or trouble breathing Eye pain, redness, irritation, or discharge with blurry or decreased vision Heart muscle inflammation--unusual weakness or fatigue, shortness of breath, chest pain, fast or irregular heartbeat, dizziness, swelling of the ankles, feet, or hands Hormone gland problems--headache, sensitivity to light, unusual weakness or fatigue, dizziness, fast or irregular heartbeat, increased sensitivity to cold or heat, excessive sweating, constipation, hair loss, increased thirst or amount of urine, tremors or shaking, irritability Infusion reactions--chest pain, shortness of breath or trouble breathing, feeling faint or lightheaded Kidney injury (glomerulonephritis)--decrease in the amount of urine, red or dark brown urine, foamy or bubbly urine, swelling of the ankles, hands, or feet Liver injury--right upper belly  pain, loss of appetite, nausea, light-colored stool, dark yellow or brown urine, yellowing skin or eyes, unusual weakness or fatigue Pain, tingling, or numbness in the hands or feet, muscle weakness, change in vision, confusion or trouble speaking, loss of balance or coordination, trouble walking, seizures Rash, fever, and swollen lymph nodes Redness, blistering, peeling, or loosening of the skin, including inside the mouth Sudden or  severe stomach pain, bloody diarrhea, fever, nausea, vomiting Side effects that usually do not require medical attention (report to your care team if they continue or are bothersome): Bone, joint, or muscle pain Diarrhea Fatigue Loss of appetite Nausea Skin rash This list may not describe all possible side effects. Call your doctor for medical advice about side effects. You may report side effects to FDA at 1-800-FDA-1088. Where should I keep my medication? This medication is given in a hospital or clinic. It will not be stored at home. NOTE: This sheet is a summary. It may not cover all possible information. If you have questions about this medicine, talk to your doctor, pharmacist, or health care provider.  2024 Elsevier/Gold Standard (2022-01-26 00:00:00)

## 2023-08-29 NOTE — Progress Notes (Unsigned)
Patient seen by Lonna Cobb NP today  Vitals are within treatment parameters:Yes   Labs are within treatment parameters: Yes   Treatment plan has been signed: Yes   Per physician team, Patient is ready for treatment and there are NO modifications to the treatment plan.

## 2023-08-30 ENCOUNTER — Telehealth: Payer: Self-pay

## 2023-08-30 ENCOUNTER — Encounter: Payer: Self-pay | Admitting: Oncology

## 2023-08-30 LAB — T4: T4, Total: 8.6 ug/dL (ref 4.5–12.0)

## 2023-08-30 NOTE — Telephone Encounter (Signed)
Called patient for first time chemo follow-up.  Patient stated he felt well and reported no issues, questions, or concerns.  Patient instructed to contact office with any future questions or concerns.  Patient verbalized understanding. All questions were answered during phone call.

## 2023-09-06 NOTE — Progress Notes (Unsigned)
    Assessment/Plan:   1.  Parkinsons Disease, diagnosed November, 2021  -Continue carbidopa/levodopa 25/100, 2/1/1.  Slightly underdosed but he is doing well clinically so we decided to continue on current dosage  -We discussed that it used to be thought that levodopa would increase risk of melanoma but now it is believed that Parkinsons itself likely increases risk of melanoma. he is to get regular skin checks.     2.  Bladder cancer  -On chemotherapy  Subjective:   Anthony Pacheco was seen today in follow up for Parkinson's disease.  He remains on levodopa and continues to do well with that.  Since our last visit, he did have a bladder cystoscopy demonstrating a nodular lesion in the left anterior bladder wall and 2 other smaller lesions to the right.  He has started chemotherapy.  No SE with his chemo.  No falls.  He hasn't seen dermatology yet.    Current prescribed movement disorder medications: Carbidopa/levodopa 25/100, 2/1/1   PREVIOUS MEDICATIONS: Sinemet  ALLERGIES:  No Known Allergies   Objective:   PHYSICAL EXAMINATION:    VITALS:   Vitals:   09/08/23 1425  BP: 136/60  Pulse: 72  SpO2: 98%  Weight: 154 lb 6.4 oz (70 kg)  Height: 5\' 9"  (1.753 m)    GEN:  The patient appears stated age and is in NAD. HEENT:  Normocephalic, atraumatic.  The mucous membranes are moist. The superficial temporal arteries are without ropiness or tenderness. CV:  RRR Lungs:  CTAB Neck/HEME:  There are no carotid bruits bilaterally.  Neurological examination:  Orientation: The patient is alert and oriented x3. Cranial nerves: There is good facial symmetry with min facial hypomimia. The speech is fluent and clear. Soft palate rises symmetrically and there is no tongue deviation. Hearing is intact to conversational tone. Sensation: Sensation is intact to light touch throughout Motor: Strength is at least antigravity x4.  Movement examination: Tone: There is min increased tone  in the LUE (stable) Abnormal movements: there is LUE rest tremor that increases with distraction.   Coordination:  There is normal decremation with any form of RAMS, including alternating supination and pronation of the forearm, hand opening and closing, finger taps, heel taps and toe taps.  Gait and Station: The patient has no difficulty arising out of a deep-seated chair without the use of the hands. The patient ambulates well today with slightly decreased arm swing.  He is just slightly forward flexed  I have reviewed and interpreted the following labs independently    Chemistry      Component Value Date/Time   NA 144 08/29/2023 0816   K 3.8 08/29/2023 0816   CL 110 08/29/2023 0816   CO2 27 08/29/2023 0816   BUN 20 08/29/2023 0816   CREATININE 1.41 (H) 08/29/2023 0816      Component Value Date/Time   CALCIUM 9.0 08/29/2023 0816   ALKPHOS 45 08/29/2023 0816   AST 12 (L) 08/29/2023 0816   ALT <5 08/29/2023 0816   BILITOT 0.5 08/29/2023 0816       Lab Results  Component Value Date   WBC 6.7 08/29/2023   HGB 12.9 (L) 08/29/2023   HCT 40.5 08/29/2023   MCV 96.2 08/29/2023   PLT 210 08/29/2023    Lab Results  Component Value Date   TSH 1.018 08/29/2023      Cc:  No primary care provider on file.

## 2023-09-07 DIAGNOSIS — H25811 Combined forms of age-related cataract, right eye: Secondary | ICD-10-CM | POA: Diagnosis not present

## 2023-09-07 DIAGNOSIS — H04123 Dry eye syndrome of bilateral lacrimal glands: Secondary | ICD-10-CM | POA: Diagnosis not present

## 2023-09-07 DIAGNOSIS — H40031 Anatomical narrow angle, right eye: Secondary | ICD-10-CM | POA: Diagnosis not present

## 2023-09-07 DIAGNOSIS — D3132 Benign neoplasm of left choroid: Secondary | ICD-10-CM | POA: Diagnosis not present

## 2023-09-07 DIAGNOSIS — Z961 Presence of intraocular lens: Secondary | ICD-10-CM | POA: Diagnosis not present

## 2023-09-07 DIAGNOSIS — H43812 Vitreous degeneration, left eye: Secondary | ICD-10-CM | POA: Diagnosis not present

## 2023-09-07 DIAGNOSIS — H26492 Other secondary cataract, left eye: Secondary | ICD-10-CM | POA: Diagnosis not present

## 2023-09-08 ENCOUNTER — Encounter: Payer: Self-pay | Admitting: Neurology

## 2023-09-08 ENCOUNTER — Ambulatory Visit: Payer: Medicare PPO | Admitting: Neurology

## 2023-09-08 VITALS — BP 136/60 | HR 72 | Ht 69.0 in | Wt 154.4 lb

## 2023-09-08 DIAGNOSIS — G20A1 Parkinson's disease without dyskinesia, without mention of fluctuations: Secondary | ICD-10-CM | POA: Diagnosis not present

## 2023-09-08 MED ORDER — CARBIDOPA-LEVODOPA 25-100 MG PO TABS: ORAL_TABLET | ORAL | 1 refills | Status: DC

## 2023-09-08 NOTE — Patient Instructions (Signed)
 Local and Online Resources for Power over Parkinson's Group?  November 2024  ?  LOCAL [place] PARKINSON'S GROUPS??  Power over [employer] Group:???  Power Over Parkinson's Patient Education Group will be Wednesday November 13th Upcoming Power over [employer] Meetings/Care Partner Support:? 2nd Wednesdays of the month at 2 pm:  November 13th, December 11th Contact Amy Marriott at [email or web address] or Lynwood Dawley at [email or web address] if interested in participating in this group?  ?  LOCAL EVENTS AND NEW OFFERINGS?  Parkinson's Social Game Night.  First Thursday of each month, 2:00-4:00 pm.  Rossie Muskrat [facility] [place], [place].  Contact [email or web address] if interested.  Parkinson's CarePartner Group for Men is in the works, if interested email Sarah ?[email or web address]  ACT FITNESS Chair Yoga classes "Train and Gain", Fridays 10 am, [employer].  Contact Gina at [phone].   PWR! Moves [place] class!  Wednesdays at 10 am.  Please contact Lonia Blood, PT at [email or web address] if interested. Health visitor Classes offering at [employer]!? Tuesdays (Chair Yoga)  and Wednesdays (PWR! Moves)  1:00 pm.?? Contact Aldona Lento [phone] or [email or web address] Drumming for Parkinson's will be held on 2nd and 4th Mondays at 11:00 am.?? Located at the [place] of the [employer] ([address] [place].)? Contact Albertina Parr at [email or web address] or [phone]?  [employer] Parkinson's Tai Chi Class, Mondays at 11 am.  Call [phone] for details  [facility] [address], [place] Wednesdays- 4:00 - 5:00 PM - specifically for Parkinson's Disease.  Free!  Contact Denny Peon, [place] - [phone] (clinic) or  [phone] (cell) or by email: [email or web address]   ?ONLINE EDUCATION AND  SUPPORT?  Parkinson Foundation:? www.parkinson.org?  PD Health at Home continues:? Mindfulness Mondays, Wellness Wednesdays, Fitness Fridays??  Upcoming Education:??  Gene and Cell Based therapies in Parkinson's.  Wednesday, October 30th, 1-2 pm Care Partner Summit:  Discovering Adaptability and Resilience.  Saturday, November 2nd, 1-3 pm  Care Partner Summit:  Designer, fashion/clothing.  Wednesday, November 20th, 1-2 pm  Expert Briefing:    What's on your mind?  Thinking and memory changes.  Wednesday, November 13th, 1-2 pm Register for virtual education and Photographer (webinars) at [email or web address].gl  Please check out their website to sign up for emails and see their full online offerings??  ?  [employer] Foundation:? www.michaeljfox.org??  Third Thursday Webinars:? On the third Thursday of every month at 12 p.m. ET, join our free live webinars to learn about various aspects of living with Parkinson's disease and our work to speed medical breakthroughs.?  Upcoming Webinar:? Year of Momentum:  What Parkinson's Research Accomplished in 2024.  Thursday, November 21st  at 12 noon.  Check out additional information on their website to see their full online offerings?  ?  [employer]:? www.[email or web address].org?  Upcoming Webinar:   Living Well with Advancing Parkinson's:  Tips and Tools for Care Partners and People with Parkinson's.  Friday, November 8th, 3 pm Series:? Living with Parkinson's Meetup.?? Third Thursdays each month, 3 pm?  Care Partner Monthly Meetup.? With Jillene Bucks [place].? First Tuesday of each month, 2 pm?  Check out additional information to Live Well Today on their website?  ?  Parkinson and Movement Disorders (PMD) Alliance:? www.pmdalliance.org?  [employer] Online:? Online Education Events?  Sign up for emails, which are sent weekly to give you updates on programming and online offerings?  ?  Parkinson's  Association of [place]:? www.parkinsonassociation.org?  Information on online  support groups, education events, and online exercises including Yoga, Parkinson's exercises and more-LOTS of information on links to PD resources and online events?  Virtual Support Group through [employer] of the [place]-November 6th at 2 pm   MOVEMENT AND EXERCISE OPPORTUNITIES?  PWR! Moves [place] class has returned!  Wednesdays at 10 am.  Please contact Lonia Blood, PT at [email or web address] if interested. Parkinson's Exercise Class offerings at [employer]. Tuesdays (Chair yoga) and Wednesdays (PWR! Moves)  1:00 pm.?  Contact Aldona Lento [phone] or [email or web address]  Parkinson's Wellness Recovery (PWR! Moves)? www.pwr4life.org?  Info on the PWR! Virtual Experience:? You will have access to our expertise?through self-assessment, guided plans that start with the PD-specific fundamentals, educational content, tips, Q&A with an expert, and a growing Engineering geologist of PD-specific pre-recorded and live exercise classes of varying types and intensity - both physical and cognitive! If that is not enough, we offer 1:1 wellness consultations (in-person or virtual) to personalize your PWR! Dance movement psychotherapist.??  Parkinson [employer] Fridays:??  As part of the PD Health @ Home program, this free video series focuses each week on one aspect of fitness designed to support people living with Parkinson's.? These weekly videos highlight the Parkinson Foundation fitness guidelines for people with Parkinson's disease.?  MenusLocal.com.br?  Dance for PD website is offering free, live-stream classes throughout the week, as well as links to [employer] of classes:? [email or web address]://[email or web address]/?  Virtual dance and Pilates for Parkinson's classes: Click on the Community Tab> Parkinson's Movement Initiative Tab.? To register for classes  and for more information, visit www.[email or web address].co.za and click the "community" tab.??  [employer] Parkinson's Cycling Classes??  [employer]:? Thursdays @ Noon-Live classes at [employer] ([employer] at [email or web address]?or [phone])?  [employer] [employer]: Classes Tuesday, Wednesday and Thursday (contact [place] at [email or web address] ?or [phone])?  [employer]?  Varied levels of classes are offered Tuesdays and Thursdays at [facility].??  Stretching with Byrd Hesselbach weekly class is also offered for people with Parkinson's?  To observe a class or for more information, call [phone] or email Patricia Nettle at [email or web address]?    ADDITIONAL SUPPORT AND RESOURCES?  [employer]:  Chiropractor:? www.well-springsolutions.org/caregiver-education/caregiver-support-group.? You may also contact Loleta Chance at [facility] -spring.org or [phone].????  Well-Spring Navigator:? Just1Navigator program, a?free service to help individuals and families through the journey of determining care for older adults.? The "Navigator" is a Child psychotherapist, Sidney Ace, who will speak with a prospective client and/or loved ones to provide an assessment of the situation and a set of recommendations for a personalized care plan -- all free of charge, and whether?[employer]Spring [employer] offers the needed service or not. If the need is not a service we provide, we are well-connected with reputable programs in town that we can refer you to.? www.well-springsolutions.org or to speak with the Navigator, call [phone].?

## 2023-09-10 ENCOUNTER — Other Ambulatory Visit: Payer: Self-pay

## 2023-09-19 ENCOUNTER — Encounter: Payer: Self-pay | Admitting: Nurse Practitioner

## 2023-09-19 ENCOUNTER — Inpatient Hospital Stay: Payer: Medicare PPO | Admitting: Nurse Practitioner

## 2023-09-19 ENCOUNTER — Inpatient Hospital Stay: Payer: Medicare PPO

## 2023-09-19 VITALS — BP 123/73 | HR 65

## 2023-09-19 VITALS — BP 134/77 | HR 62 | Temp 98.1°F | Resp 16 | Ht 69.0 in | Wt 151.2 lb

## 2023-09-19 DIAGNOSIS — C679 Malignant neoplasm of bladder, unspecified: Secondary | ICD-10-CM

## 2023-09-19 DIAGNOSIS — Z79899 Other long term (current) drug therapy: Secondary | ICD-10-CM | POA: Diagnosis not present

## 2023-09-19 DIAGNOSIS — E039 Hypothyroidism, unspecified: Secondary | ICD-10-CM | POA: Diagnosis not present

## 2023-09-19 DIAGNOSIS — Z5112 Encounter for antineoplastic immunotherapy: Secondary | ICD-10-CM | POA: Diagnosis not present

## 2023-09-19 LAB — CBC WITH DIFFERENTIAL (CANCER CENTER ONLY)
Abs Immature Granulocytes: 0.07 10*3/uL (ref 0.00–0.07)
Basophils Absolute: 0.1 10*3/uL (ref 0.0–0.1)
Basophils Relative: 2 %
Eosinophils Absolute: 0.2 10*3/uL (ref 0.0–0.5)
Eosinophils Relative: 2 %
HCT: 41.5 % (ref 39.0–52.0)
Hemoglobin: 13.3 g/dL (ref 13.0–17.0)
Immature Granulocytes: 1 %
Lymphocytes Relative: 24 %
Lymphs Abs: 2.3 10*3/uL (ref 0.7–4.0)
MCH: 31 pg (ref 26.0–34.0)
MCHC: 32 g/dL (ref 30.0–36.0)
MCV: 96.7 fL (ref 80.0–100.0)
Monocytes Absolute: 0.8 10*3/uL (ref 0.1–1.0)
Monocytes Relative: 9 %
Neutro Abs: 6.1 10*3/uL (ref 1.7–7.7)
Neutrophils Relative %: 62 %
Platelet Count: 239 10*3/uL (ref 150–400)
RBC: 4.29 MIL/uL (ref 4.22–5.81)
RDW: 12.7 % (ref 11.5–15.5)
WBC Count: 9.6 10*3/uL (ref 4.0–10.5)
nRBC: 0 % (ref 0.0–0.2)

## 2023-09-19 LAB — CMP (CANCER CENTER ONLY)
ALT: <5 U/L (ref 0–44)
AST: 17 U/L (ref 15–41)
Albumin: 4.2 g/dL (ref 3.5–5.0)
Alkaline Phosphatase: 56 U/L (ref 38–126)
Anion gap: 7 (ref 5–15)
BUN: 19 mg/dL (ref 8–23)
CO2: 26 mmol/L (ref 22–32)
Calcium: 9.2 mg/dL (ref 8.9–10.3)
Chloride: 108 mmol/L (ref 98–111)
Creatinine: 1.45 mg/dL — ABNORMAL HIGH (ref 0.61–1.24)
GFR, Estimated: 47 mL/min — ABNORMAL LOW (ref >60)
Glucose, Bld: 85 mg/dL (ref 70–99)
Potassium: 4.1 mmol/L (ref 3.5–5.1)
Sodium: 141 mmol/L (ref 135–145)
Total Bilirubin: 0.6 mg/dL (ref <1.2)
Total Protein: 7 g/dL (ref 6.5–8.1)

## 2023-09-19 MED ORDER — SODIUM CHLORIDE 0.9 % IV SOLN
200.0000 mg | Freq: Once | INTRAVENOUS | Status: AC
  Administered 2023-09-19: 200 mg via INTRAVENOUS
  Filled 2023-09-19: qty 8

## 2023-09-19 MED ORDER — SODIUM CHLORIDE 0.9 % IV SOLN
INTRAVENOUS | Status: DC
Start: 2023-09-19 — End: 2023-09-19

## 2023-09-19 NOTE — Progress Notes (Signed)
  [facility] OFFICE PROGRESS NOTE   Diagnosis: Bladder cancer  INTERVAL HISTORY:   [name] returns as scheduled.  He completed cycle 1 Pembrolizumab 08/29/2023.  No rash or diarrhea.  No nausea or vomiting.  He denies pain.  No hematuria.  He has a good appetite.  Objective:  Vital signs in last 24 hours:  Blood pressure 134/77, pulse 62, temperature 98.1 F (36.7 C), temperature source Temporal, resp. rate 16, height 5\' 9"  (1.753 m), weight 151 lb 3.2 oz (68.6 kg).    HEENT: No thrush or ulcers. Resp: Lungs clear bilaterally. Cardio: Regular rate and rhythm. GI: No hepatosplenomegaly. Vascular: No leg edema. Skin: No rash.   Lab Results:  Lab Results  Component Value Date   WBC 9.6 09/19/2023   HGB 13.3 09/19/2023   HCT 41.5 09/19/2023   MCV 96.7 09/19/2023   PLT 239 09/19/2023   NEUTROABS 6.1 09/19/2023    Imaging:  No results found.  Medications: I have reviewed the patient's current medications.  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  TURBT 07/18/2018.  Found to have high-grade invasive urothelial carcinoma; with suspicion for invasion into the muscularis propria, T2 lesion with grade 3 histology.  He completed radiation therapy with weekly carboplatin 09/04/2018 through 10/25/2018. Cystoscopy 06/09/2023-nodular lesion left anterior bladder wall, 2 other smaller lesions to the right CTs 07/03/2023-negative for metastatic disease Cystoscopy/TURBT 07/26/2023-scattered patches of sessile appearing tumors with associated dystrophic calcifications along the left anterior bladder wall and dome; additional small patches of sessile appearing tumor along the posterior bladder wall left greater than right and right anterior bladder wall; entirety of the left anterior sessile tumor was resected, remainder of the tumors were cauterized, pathology-high-grade invasive urothelial carcinoma, muscularis propria not present, lymphovascular  invasion not identified, tumor invades at least lamina propria.  PD-L1 CPS 3 Cycle 1 Pembrolizumab 08/29/2023 Cycle 2 Pembrolizumab 09/19/2023 Recent diagnosis of Parkinson's, followed by Dr. Arbutus Leas Hypothyroid  Disposition: [name] appears stable.  He has completed 1 cycle of Pembrolizumab.  Plan to proceed with cycle 2 today as scheduled.  CBC and chemistry panel reviewed.  Labs adequate to proceed as above.  He will return for follow-up and Pembrolizumab in 3 weeks.  We are available to see him sooner if needed.  Lonna Cobb ANP/GNP-BC   09/19/2023  11:47 AM

## 2023-09-19 NOTE — Patient Instructions (Signed)
 [facility] - A DEPT OF [facility]  Discharge Instructions: Thank you for choosing [facility] to provide your oncology and hematology care.   If you have a lab appointment with the Cancer Center, please go directly to the Cancer Center and check in at the registration area.   Wear comfortable clothing and clothing appropriate for easy access to any Portacath or PICC line.   We strive to give you quality time with your provider. You may need to reschedule your appointment if you arrive late (15 or more minutes).  Arriving late affects you and other patients whose appointments are after yours.  Also, if you miss three or more appointments without notifying the office, you may be dismissed from the clinic at the provider's discretion.      For prescription refill requests, have your pharmacy contact our office and allow 72 hours for refills to be completed.    Today you received the following chemotherapy and/or immunotherapy agents KEYTRUDA      To help prevent nausea and vomiting after your treatment, we encourage you to take your nausea medication as directed.  BELOW ARE SYMPTOMS THAT SHOULD BE REPORTED IMMEDIATELY: *FEVER GREATER THAN 100.4 F (38 C) OR HIGHER *CHILLS OR SWEATING *NAUSEA AND VOMITING THAT IS NOT CONTROLLED WITH YOUR NAUSEA MEDICATION *UNUSUAL SHORTNESS OF BREATH *UNUSUAL BRUISING OR BLEEDING *URINARY PROBLEMS (pain or burning when urinating, or frequent urination) *BOWEL PROBLEMS (unusual diarrhea, constipation, pain near the anus) TENDERNESS IN MOUTH AND THROAT WITH OR WITHOUT PRESENCE OF ULCERS (sore throat, sores in mouth, or a toothache) UNUSUAL RASH, SWELLING OR PAIN  UNUSUAL VAGINAL DISCHARGE OR ITCHING   Items with * indicate a potential emergency and should be followed up as soon as possible or go to the Emergency Department if any problems should occur.  Please show the CHEMOTHERAPY ALERT CARD or IMMUNOTHERAPY  ALERT CARD at check-in to the Emergency Department and triage nurse.  Should you have questions after your visit or need to cancel or reschedule your appointment, please contact [facility] - A DEPT OF [facility]  Dept: [phone]  and follow the prompts.  Office hours are 8:00 a.m. to 4:30 p.m. Monday - Friday. Please note that voicemails left after 4:00 p.m. may not be returned until the following business day.  We are closed weekends and major holidays. You have access to a nurse at all times for urgent questions. Please call the main number to the clinic Dept: [phone] and follow the prompts.   For any non-urgent questions, you may also contact your provider using MyChart. We now offer e-Visits for anyone 38 and older to request care online for non-urgent symptoms. For details visit [email or web address].   Also download the MyChart app! Go to the app store, search "MyChart", open the app, select [place], and log in with your MyChart username and password.  Pembrolizumab Injection What is this medication? PEMBROLIZUMAB (PEM broe LIZ ue mab) treats some types of cancer. It works by helping your immune system slow or stop the spread of cancer cells. It is a monoclonal antibody. This medicine may be used for other purposes; ask your health care provider or pharmacist if you have questions. COMMON BRAND NAME(S): Keytruda What should I tell my care team before I take this medication? They need to know if you have any of these conditions: Allogeneic stem cell transplant (uses someone else's stem cells) Autoimmune diseases, such as  Crohn disease, ulcerative colitis, lupus History of chest radiation Nervous system problems, such as Guillain-Barre syndrome, myasthenia gravis Organ transplant An unusual or allergic reaction to pembrolizumab, other medications, foods, dyes, or preservatives Pregnant or trying to get pregnant Breast-feeding How should I  use this medication? This medication is injected into a vein. It is given by your care team in a hospital or clinic setting. A special MedGuide will be given to you before each treatment. Be sure to read this information carefully each time. Talk to your care team about the use of this medication in children. While it may be prescribed for children as young as 6 months for selected conditions, precautions do apply. Overdosage: If you think you have taken too much of this medicine contact a poison control center or emergency room at once. NOTE: This medicine is only for you. Do not share this medicine with others. What if I miss a dose? Keep appointments for follow-up doses. It is important not to miss your dose. Call your care team if you are unable to keep an appointment. What may interact with this medication? Interactions have not been studied. This list may not describe all possible interactions. Give your health care provider a list of all the medicines, herbs, non-prescription drugs, or dietary supplements you use. Also tell them if you smoke, drink alcohol, or use illegal drugs. Some items may interact with your medicine. What should I watch for while using this medication? Your condition will be monitored carefully while you are receiving this medication. You may need blood work while taking this medication. This medication may cause serious skin reactions. They can happen weeks to months after starting the medication. Contact your care team right away if you notice fevers or flu-like symptoms with a rash. The rash may be red or purple and then turn into blisters or peeling of the skin. You may also notice a red rash with swelling of the face, lips, or lymph nodes in your neck or under your arms. Tell your care team right away if you have any change in your eyesight. Talk to your care team if you may be pregnant. Serious birth defects can occur if you take this medication during pregnancy and  for 4 months after the last dose. You will need a negative pregnancy test before starting this medication. Contraception is recommended while taking this medication and for 4 months after the last dose. Your care team can help you find the option that works for you. Do not breastfeed while taking this medication and for 4 months after the last dose. What side effects may I notice from receiving this medication? Side effects that you should report to your care team as soon as possible: Allergic reactions--skin rash, itching, hives, swelling of the face, lips, tongue, or throat Dry cough, shortness of breath or trouble breathing Eye pain, redness, irritation, or discharge with blurry or decreased vision Heart muscle inflammation--unusual weakness or fatigue, shortness of breath, chest pain, fast or irregular heartbeat, dizziness, swelling of the ankles, feet, or hands Hormone gland problems--headache, sensitivity to light, unusual weakness or fatigue, dizziness, fast or irregular heartbeat, increased sensitivity to cold or heat, excessive sweating, constipation, hair loss, increased thirst or amount of urine, tremors or shaking, irritability Infusion reactions--chest pain, shortness of breath or trouble breathing, feeling faint or lightheaded Kidney injury (glomerulonephritis)--decrease in the amount of urine, red or dark brown urine, foamy or bubbly urine, swelling of the ankles, hands, or feet Liver injury--right upper belly  pain, loss of appetite, nausea, light-colored stool, dark yellow or brown urine, yellowing skin or eyes, unusual weakness or fatigue Pain, tingling, or numbness in the hands or feet, muscle weakness, change in vision, confusion or trouble speaking, loss of balance or coordination, trouble walking, seizures Rash, fever, and swollen lymph nodes Redness, blistering, peeling, or loosening of the skin, including inside the mouth Sudden or severe stomach pain, bloody diarrhea, fever,  nausea, vomiting Side effects that usually do not require medical attention (report to your care team if they continue or are bothersome): Bone, joint, or muscle pain Diarrhea Fatigue Loss of appetite Nausea Skin rash This list may not describe all possible side effects. Call your doctor for medical advice about side effects. You may report side effects to FDA at 1-800-FDA-1088. Where should I keep my medication? This medication is given in a hospital or clinic. It will not be stored at home. NOTE: This sheet is a summary. It may not cover all possible information. If you have questions about this medicine, talk to your doctor, pharmacist, or health care provider.  2024 Elsevier/Gold Standard (2022-01-26 00:00:00)

## 2023-09-19 NOTE — Addendum Note (Signed)
Addended by: Thornton Papas B on: 09/19/2023 12:29 PM   Modules accepted: Orders

## 2023-09-19 NOTE — Progress Notes (Signed)
Patient seen by Lonna Cobb NP today  Vitals are within treatment parameters:Yes   Labs are within treatment parameters: No (Please specify and give further instructions.)  Creatinine 1.45 Treatment plan has been signed: Yes   Per physician team, Patient is ready for treatment and there are NO modifications to the treatment plan.

## 2023-10-09 ENCOUNTER — Other Ambulatory Visit: Payer: Self-pay | Admitting: Oncology

## 2023-10-11 ENCOUNTER — Inpatient Hospital Stay: Payer: Medicare PPO | Attending: Nurse Practitioner

## 2023-10-11 ENCOUNTER — Inpatient Hospital Stay: Payer: Medicare PPO

## 2023-10-11 ENCOUNTER — Inpatient Hospital Stay: Payer: Medicare PPO | Admitting: Oncology

## 2023-10-11 VITALS — BP 136/71 | HR 86 | Temp 98.2°F | Resp 18 | Ht 69.0 in | Wt 150.2 lb

## 2023-10-11 DIAGNOSIS — Z923 Personal history of irradiation: Secondary | ICD-10-CM | POA: Diagnosis not present

## 2023-10-11 DIAGNOSIS — Z5112 Encounter for antineoplastic immunotherapy: Secondary | ICD-10-CM | POA: Diagnosis present

## 2023-10-11 DIAGNOSIS — G20A1 Parkinson's disease without dyskinesia, without mention of fluctuations: Secondary | ICD-10-CM | POA: Diagnosis not present

## 2023-10-11 DIAGNOSIS — C679 Malignant neoplasm of bladder, unspecified: Secondary | ICD-10-CM | POA: Diagnosis present

## 2023-10-11 DIAGNOSIS — E039 Hypothyroidism, unspecified: Secondary | ICD-10-CM | POA: Diagnosis not present

## 2023-10-11 LAB — CMP (CANCER CENTER ONLY)
ALT: 8 U/L (ref 0–44)
AST: 13 U/L — ABNORMAL LOW (ref 15–41)
Albumin: 4.4 g/dL (ref 3.5–5.0)
Alkaline Phosphatase: 55 U/L (ref 38–126)
Anion gap: 8 (ref 5–15)
BUN: 20 mg/dL (ref 8–23)
CO2: 28 mmol/L (ref 22–32)
Calcium: 9.3 mg/dL (ref 8.9–10.3)
Chloride: 105 mmol/L (ref 98–111)
Creatinine: 1.54 mg/dL — ABNORMAL HIGH (ref 0.61–1.24)
GFR, Estimated: 44 mL/min — ABNORMAL LOW (ref >60)
Glucose, Bld: 81 mg/dL (ref 70–99)
Potassium: 3.5 mmol/L (ref 3.5–5.1)
Sodium: 141 mmol/L (ref 135–145)
Total Bilirubin: 0.6 mg/dL (ref 0.0–1.2)
Total Protein: 7.1 g/dL (ref 6.5–8.1)

## 2023-10-11 LAB — CBC WITH DIFFERENTIAL (CANCER CENTER ONLY)
Abs Immature Granulocytes: 0.03 10*3/uL (ref 0.00–0.07)
Basophils Absolute: 0.1 10*3/uL (ref 0.0–0.1)
Basophils Relative: 1 %
Eosinophils Absolute: 0.2 10*3/uL (ref 0.0–0.5)
Eosinophils Relative: 3 %
HCT: 41.8 % (ref 39.0–52.0)
Hemoglobin: 13.6 g/dL (ref 13.0–17.0)
Immature Granulocytes: 0 %
Lymphocytes Relative: 25 %
Lymphs Abs: 1.8 10*3/uL (ref 0.7–4.0)
MCH: 31 pg (ref 26.0–34.0)
MCHC: 32.5 g/dL (ref 30.0–36.0)
MCV: 95.2 fL (ref 80.0–100.0)
Monocytes Absolute: 0.6 10*3/uL (ref 0.1–1.0)
Monocytes Relative: 8 %
Neutro Abs: 4.5 10*3/uL (ref 1.7–7.7)
Neutrophils Relative %: 63 %
Platelet Count: 240 10*3/uL (ref 150–400)
RBC: 4.39 MIL/uL (ref 4.22–5.81)
RDW: 12.5 % (ref 11.5–15.5)
WBC Count: 7.2 10*3/uL (ref 4.0–10.5)
nRBC: 0 % (ref 0.0–0.2)

## 2023-10-11 LAB — TSH: TSH: 0.172 u[IU]/mL — ABNORMAL LOW (ref 0.350–4.500)

## 2023-10-11 NOTE — Progress Notes (Unsigned)
 Unable to get successful PIV started after 6 attempts.  Patient asked to be scheduled for another day this week. Scheduling message sent, pharmacy and Dr. Cloretta made aware.  Patient rescheduled for later this week.  Patient advised to drink fluids the day before and the morning of next treatment.  Patient verbalized understanding.

## 2023-10-11 NOTE — Progress Notes (Signed)
  Patient seen by Dr. Arley Hof today  Vitals are within treatment parameters:Yes   Labs are within treatment parameters: No Creatinine 1.54, okay to treat per Dr. Hof   Treatment plan has been signed: Yes   Per physician team, Patient is ready for treatment and there are NO modifications to the treatment plan.

## 2023-10-11 NOTE — Progress Notes (Signed)
  [facility] OFFICE PROGRESS NOTE   Diagnosis: Bladder cancer  INTERVAL HISTORY:   Anthony Pacheco Completed another treat with pembrolizumab  on 09/19/2023.  No rash or diarrhea.  No hematuria.  No complaint.  Objective:  Vital signs in last 24 hours:  Blood pressure 136/71, pulse 86, temperature 98.2 F (36.8 C), temperature source Temporal, resp. rate 18, height 5' 9 (1.753 m), weight 150 lb 3.2 oz (68.1 kg), SpO2 100%.    Resp: Lungs clear bilaterally Cardio: Regular rate and rhythm GI: No hepatosplenomegaly, nontender Vascular: No leg edema     Lab Results:  Lab Results  Component Value Date   WBC 9.6 09/19/2023   HGB 13.3 09/19/2023   HCT 41.5 09/19/2023   MCV 96.7 09/19/2023   PLT 239 09/19/2023   NEUTROABS 6.1 09/19/2023    CMP  Lab Results  Component Value Date   NA 141 09/19/2023   K 4.1 09/19/2023   CL 108 09/19/2023   CO2 26 09/19/2023   GLUCOSE 85 09/19/2023   BUN 19 09/19/2023   CREATININE 1.45 (H) 09/19/2023   CALCIUM 9.2 09/19/2023   PROT 7.0 09/19/2023   ALBUMIN 4.2 09/19/2023   AST 17 09/19/2023   ALT <5 09/19/2023   ALKPHOS 56 09/19/2023   BILITOT 0.6 09/19/2023   GFRNONAA 47 (L) 09/19/2023   GFRAA 52 (L) 05/20/2020    No results found for: CEA1, CEA, CAN199, CA125  No results found for: INR, LABPROT  Imaging:  No results found.  Medications: I have reviewed the patient's current medications.   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  TURBT 07/18/2018.  Found to have high-grade invasive urothelial carcinoma; with suspicion for invasion into the muscularis propria, T2 lesion with grade 3 histology.  He completed radiation therapy with weekly carboplatin  09/04/2018 through 10/25/2018. Cystoscopy 06/09/2023-nodular lesion left anterior bladder wall, 2 other smaller lesions to the right CTs 07/03/2023-negative for metastatic disease Cystoscopy/TURBT 07/26/2023-scattered patches of  sessile appearing tumors with associated dystrophic calcifications along the left anterior bladder wall and dome; additional small patches of sessile appearing tumor along the posterior bladder wall left greater than right and right anterior bladder wall; entirety of the left anterior sessile tumor was resected, remainder of the tumors were cauterized, pathology-high-grade invasive urothelial carcinoma, muscularis propria not present, lymphovascular invasion not identified, tumor invades at least lamina propria.  PD-L1 CPS 3 Cycle 1 Pembrolizumab  08/29/2023 Cycle 2 Pembrolizumab  09/19/2023 Cycle 3 pembrolizumab  10/11/2023 Recent diagnosis of Parkinson's, followed by Dr. Evonnie Hypothyroid    Disposition: Anthony Pacheco has completed 2 treatments with pembrolizumab .  He is tolerated the pembrolizumab  well.  He will complete cycle 3 today.  We will follow-up on the thyroid  panel today.  He is scheduled to see Dr. Nicholaus next month.  Anthony. Pacheco will return for an office visit and pembrolizumab  in 3 weeks.  Arley Hof, MD  10/11/2023  8:17 AM

## 2023-10-12 LAB — T4: T4, Total: 8.8 ug/dL (ref 4.5–12.0)

## 2023-10-14 ENCOUNTER — Inpatient Hospital Stay: Payer: Medicare PPO

## 2023-10-14 VITALS — BP 130/71 | HR 81 | Temp 98.2°F | Resp 18

## 2023-10-14 DIAGNOSIS — Z5112 Encounter for antineoplastic immunotherapy: Secondary | ICD-10-CM | POA: Diagnosis not present

## 2023-10-14 DIAGNOSIS — C679 Malignant neoplasm of bladder, unspecified: Secondary | ICD-10-CM

## 2023-10-14 MED ORDER — SODIUM CHLORIDE 0.9 % IV SOLN: INTRAVENOUS | Status: DC

## 2023-10-14 MED ORDER — PEMBROLIZUMAB CHEMO INJECTION 100 MG/4ML
200.0000 mg | Freq: Once | INTRAVENOUS | Status: AC
  Administered 2023-10-14: 200 mg via INTRAVENOUS
  Filled 2023-10-14: qty 8

## 2023-10-14 NOTE — Patient Instructions (Signed)
 [facility] - A DEPT OF [facility]  Discharge Instructions: Thank you for choosing [facility] to provide your oncology and hematology care.   If you have a lab appointment with the Cancer Center, please go directly to the Cancer Center and check in at the registration area.   Wear comfortable clothing and clothing appropriate for easy access to any Portacath or PICC line.   We strive to give you quality time with your provider. You may need to reschedule your appointment if you arrive late (15 or more minutes).  Arriving late affects you and other patients whose appointments are after yours.  Also, if you miss three or more appointments without notifying the office, you may be dismissed from the clinic at the provider's discretion.      For prescription refill requests, have your pharmacy contact our office and allow 72 hours for refills to be completed.    Today you received the following chemotherapy and/or immunotherapy agents KEYTRUDA      To help prevent nausea and vomiting after your treatment, we encourage you to take your nausea medication as directed.  BELOW ARE SYMPTOMS THAT SHOULD BE REPORTED IMMEDIATELY: *FEVER GREATER THAN 100.4 F (38 C) OR HIGHER *CHILLS OR SWEATING *NAUSEA AND VOMITING THAT IS NOT CONTROLLED WITH YOUR NAUSEA MEDICATION *UNUSUAL SHORTNESS OF BREATH *UNUSUAL BRUISING OR BLEEDING *URINARY PROBLEMS (pain or burning when urinating, or frequent urination) *BOWEL PROBLEMS (unusual diarrhea, constipation, pain near the anus) TENDERNESS IN MOUTH AND THROAT WITH OR WITHOUT PRESENCE OF ULCERS (sore throat, sores in mouth, or a toothache) UNUSUAL RASH, SWELLING OR PAIN  UNUSUAL VAGINAL DISCHARGE OR ITCHING   Items with * indicate a potential emergency and should be followed up as soon as possible or go to the Emergency Department if any problems should occur.  Please show the CHEMOTHERAPY ALERT CARD or IMMUNOTHERAPY  ALERT CARD at check-in to the Emergency Department and triage nurse.  Should you have questions after your visit or need to cancel or reschedule your appointment, please contact [facility] - A DEPT OF [facility]  Dept: [phone]  and follow the prompts.  Office hours are 8:00 a.m. to 4:30 p.m. Monday - Friday. Please note that voicemails left after 4:00 p.m. may not be returned until the following business day.  We are closed weekends and major holidays. You have access to a nurse at all times for urgent questions. Please call the main number to the clinic Dept: [phone] and follow the prompts.   For any non-urgent questions, you may also contact your provider using MyChart. We now offer e-Visits for anyone 38 and older to request care online for non-urgent symptoms. For details visit [email or web address].   Also download the MyChart app! Go to the app store, search "MyChart", open the app, select [place], and log in with your MyChart username and password.  Pembrolizumab Injection What is this medication? PEMBROLIZUMAB (PEM broe LIZ ue mab) treats some types of cancer. It works by helping your immune system slow or stop the spread of cancer cells. It is a monoclonal antibody. This medicine may be used for other purposes; ask your health care provider or pharmacist if you have questions. COMMON BRAND NAME(S): Keytruda What should I tell my care team before I take this medication? They need to know if you have any of these conditions: Allogeneic stem cell transplant (uses someone else's stem cells) Autoimmune diseases, such as  Crohn disease, ulcerative colitis, lupus History of chest radiation Nervous system problems, such as Guillain-Barre syndrome, myasthenia gravis Organ transplant An unusual or allergic reaction to pembrolizumab, other medications, foods, dyes, or preservatives Pregnant or trying to get pregnant Breast-feeding How should I  use this medication? This medication is injected into a vein. It is given by your care team in a hospital or clinic setting. A special MedGuide will be given to you before each treatment. Be sure to read this information carefully each time. Talk to your care team about the use of this medication in children. While it may be prescribed for children as young as 6 months for selected conditions, precautions do apply. Overdosage: If you think you have taken too much of this medicine contact a poison control center or emergency room at once. NOTE: This medicine is only for you. Do not share this medicine with others. What if I miss a dose? Keep appointments for follow-up doses. It is important not to miss your dose. Call your care team if you are unable to keep an appointment. What may interact with this medication? Interactions have not been studied. This list may not describe all possible interactions. Give your health care provider a list of all the medicines, herbs, non-prescription drugs, or dietary supplements you use. Also tell them if you smoke, drink alcohol, or use illegal drugs. Some items may interact with your medicine. What should I watch for while using this medication? Your condition will be monitored carefully while you are receiving this medication. You may need blood work while taking this medication. This medication may cause serious skin reactions. They can happen weeks to months after starting the medication. Contact your care team right away if you notice fevers or flu-like symptoms with a rash. The rash may be red or purple and then turn into blisters or peeling of the skin. You may also notice a red rash with swelling of the face, lips, or lymph nodes in your neck or under your arms. Tell your care team right away if you have any change in your eyesight. Talk to your care team if you may be pregnant. Serious birth defects can occur if you take this medication during pregnancy and  for 4 months after the last dose. You will need a negative pregnancy test before starting this medication. Contraception is recommended while taking this medication and for 4 months after the last dose. Your care team can help you find the option that works for you. Do not breastfeed while taking this medication and for 4 months after the last dose. What side effects may I notice from receiving this medication? Side effects that you should report to your care team as soon as possible: Allergic reactions--skin rash, itching, hives, swelling of the face, lips, tongue, or throat Dry cough, shortness of breath or trouble breathing Eye pain, redness, irritation, or discharge with blurry or decreased vision Heart muscle inflammation--unusual weakness or fatigue, shortness of breath, chest pain, fast or irregular heartbeat, dizziness, swelling of the ankles, feet, or hands Hormone gland problems--headache, sensitivity to light, unusual weakness or fatigue, dizziness, fast or irregular heartbeat, increased sensitivity to cold or heat, excessive sweating, constipation, hair loss, increased thirst or amount of urine, tremors or shaking, irritability Infusion reactions--chest pain, shortness of breath or trouble breathing, feeling faint or lightheaded Kidney injury (glomerulonephritis)--decrease in the amount of urine, red or dark brown urine, foamy or bubbly urine, swelling of the ankles, hands, or feet Liver injury--right upper belly  pain, loss of appetite, nausea, light-colored stool, dark yellow or brown urine, yellowing skin or eyes, unusual weakness or fatigue Pain, tingling, or numbness in the hands or feet, muscle weakness, change in vision, confusion or trouble speaking, loss of balance or coordination, trouble walking, seizures Rash, fever, and swollen lymph nodes Redness, blistering, peeling, or loosening of the skin, including inside the mouth Sudden or severe stomach pain, bloody diarrhea, fever,  nausea, vomiting Side effects that usually do not require medical attention (report to your care team if they continue or are bothersome): Bone, joint, or muscle pain Diarrhea Fatigue Loss of appetite Nausea Skin rash This list may not describe all possible side effects. Call your doctor for medical advice about side effects. You may report side effects to FDA at 1-800-FDA-1088. Where should I keep my medication? This medication is given in a hospital or clinic. It will not be stored at home. NOTE: This sheet is a summary. It may not cover all possible information. If you have questions about this medicine, talk to your doctor, pharmacist, or health care provider.  2024 Elsevier/Gold Standard (2022-01-26 00:00:00)

## 2023-10-31 ENCOUNTER — Ambulatory Visit: Payer: Medicare PPO

## 2023-10-31 ENCOUNTER — Other Ambulatory Visit: Payer: Medicare PPO

## 2023-10-31 ENCOUNTER — Ambulatory Visit: Payer: Medicare PPO | Admitting: Oncology

## 2023-11-04 ENCOUNTER — Inpatient Hospital Stay: Payer: Medicare PPO

## 2023-11-04 ENCOUNTER — Encounter: Payer: Self-pay | Admitting: Oncology

## 2023-11-04 ENCOUNTER — Inpatient Hospital Stay: Payer: Medicare PPO | Admitting: Oncology

## 2023-11-04 ENCOUNTER — Inpatient Hospital Stay: Payer: Medicare PPO | Attending: Nurse Practitioner

## 2023-11-04 VITALS — BP 102/57 | HR 94 | Temp 98.2°F | Resp 18 | Ht 69.0 in | Wt 148.9 lb

## 2023-11-04 VITALS — BP 114/68 | HR 57

## 2023-11-04 DIAGNOSIS — C679 Malignant neoplasm of bladder, unspecified: Secondary | ICD-10-CM

## 2023-11-04 DIAGNOSIS — Z5112 Encounter for antineoplastic immunotherapy: Secondary | ICD-10-CM | POA: Insufficient documentation

## 2023-11-04 DIAGNOSIS — Z923 Personal history of irradiation: Secondary | ICD-10-CM | POA: Diagnosis not present

## 2023-11-04 DIAGNOSIS — E039 Hypothyroidism, unspecified: Secondary | ICD-10-CM | POA: Insufficient documentation

## 2023-11-04 DIAGNOSIS — R350 Frequency of micturition: Secondary | ICD-10-CM | POA: Diagnosis not present

## 2023-11-04 DIAGNOSIS — G20A1 Parkinson's disease without dyskinesia, without mention of fluctuations: Secondary | ICD-10-CM | POA: Diagnosis not present

## 2023-11-04 LAB — CBC WITH DIFFERENTIAL (CANCER CENTER ONLY)
Abs Immature Granulocytes: 0.04 10*3/uL (ref 0.00–0.07)
Basophils Absolute: 0.1 10*3/uL (ref 0.0–0.1)
Basophils Relative: 1 %
Eosinophils Absolute: 0.2 10*3/uL (ref 0.0–0.5)
Eosinophils Relative: 3 %
HCT: 40.6 % (ref 39.0–52.0)
Hemoglobin: 13.2 g/dL (ref 13.0–17.0)
Immature Granulocytes: 1 %
Lymphocytes Relative: 21 %
Lymphs Abs: 1.3 10*3/uL (ref 0.7–4.0)
MCH: 30.6 pg (ref 26.0–34.0)
MCHC: 32.5 g/dL (ref 30.0–36.0)
MCV: 94.2 fL (ref 80.0–100.0)
Monocytes Absolute: 0.4 10*3/uL (ref 0.1–1.0)
Monocytes Relative: 7 %
Neutro Abs: 4.3 10*3/uL (ref 1.7–7.7)
Neutrophils Relative %: 67 %
Platelet Count: 223 10*3/uL (ref 150–400)
RBC: 4.31 MIL/uL (ref 4.22–5.81)
RDW: 12.4 % (ref 11.5–15.5)
WBC Count: 6.3 10*3/uL (ref 4.0–10.5)
nRBC: 0 % (ref 0.0–0.2)

## 2023-11-04 LAB — CMP (CANCER CENTER ONLY)
ALT: 5 U/L (ref 0–44)
AST: 12 U/L — ABNORMAL LOW (ref 15–41)
Albumin: 4.2 g/dL (ref 3.5–5.0)
Alkaline Phosphatase: 54 U/L (ref 38–126)
Anion gap: 8 (ref 5–15)
BUN: 20 mg/dL (ref 8–23)
CO2: 27 mmol/L (ref 22–32)
Calcium: 8.7 mg/dL — ABNORMAL LOW (ref 8.9–10.3)
Chloride: 108 mmol/L (ref 98–111)
Creatinine: 1.43 mg/dL — ABNORMAL HIGH (ref 0.61–1.24)
GFR, Estimated: 48 mL/min — ABNORMAL LOW (ref >60)
Glucose, Bld: 93 mg/dL (ref 70–99)
Potassium: 4 mmol/L (ref 3.5–5.1)
Sodium: 143 mmol/L (ref 135–145)
Total Bilirubin: 0.6 mg/dL (ref 0.0–1.2)
Total Protein: 6.9 g/dL (ref 6.5–8.1)

## 2023-11-04 MED ORDER — SODIUM CHLORIDE 0.9 % IV SOLN: INTRAVENOUS | Status: DC

## 2023-11-04 MED ORDER — SODIUM CHLORIDE 0.9 % IV SOLN
200.0000 mg | Freq: Once | INTRAVENOUS | Status: AC
  Administered 2023-11-04: 200 mg via INTRAVENOUS
  Filled 2023-11-04: qty 8

## 2023-11-04 NOTE — Progress Notes (Signed)
 Patient seen by Dr. Arley Hof today  Vitals are within treatment parameters:Yes   Labs are within treatment parameters: Yes   Treatment plan has been signed: Yes   Per physician team, Patient is ready for treatment and there are NO modifications to the treatment plan.

## 2023-11-04 NOTE — Progress Notes (Signed)
  [facility] OFFICE PROGRESS NOTE   Diagnosis: Bladder cancer  INTERVAL HISTORY:   Anthony. Anthony Pacheco completed another treatment with pembrolizumab  10/14/2023.  No rash or diarrhea.  He feels well.  He has urinary frequency during the day and at night.  No bleeding.  He is scheduled to see Dr. Nicholaus next week.  Objective:  Vital signs in last 24 hours:  Blood pressure (!) 102/57, pulse 94, temperature 98.2 F (36.8 C), temperature source Temporal, resp. rate 18, height 5' 9 (1.753 m), weight 148 lb 14.4 oz (67.5 kg), SpO2 90%.     Resp: Lungs clear bilaterally Cardio: Regular rate and rhythm GI: No hepatosplenomegaly, no mass, nontender Vascular: No leg edema   Lab Results:  Lab Results  Component Value Date   WBC 6.3 11/04/2023   HGB 13.2 11/04/2023   HCT 40.6 11/04/2023   MCV 94.2 11/04/2023   PLT 223 11/04/2023   NEUTROABS 4.3 11/04/2023    CMP  Lab Results  Component Value Date   NA 143 11/04/2023   K 4.0 11/04/2023   CL 108 11/04/2023   CO2 27 11/04/2023   GLUCOSE 93 11/04/2023   BUN 20 11/04/2023   CREATININE 1.43 (H) 11/04/2023   CALCIUM 8.7 (L) 11/04/2023   PROT 6.9 11/04/2023   ALBUMIN 4.2 11/04/2023   AST 12 (L) 11/04/2023   ALT 5 11/04/2023   ALKPHOS 54 11/04/2023   BILITOT 0.6 11/04/2023   GFRNONAA 48 (L) 11/04/2023   GFRAA 52 (L) 05/20/2020    No results found for: CEA1, CEA, CAN199, CA125  No results found for: INR, LABPROT  Imaging:  No results found.  Medications: I have reviewed the patient's current medications.   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  TURBT 07/18/2018.  Found to have high-grade invasive urothelial carcinoma; with suspicion for invasion into the muscularis propria, T2 lesion with grade 3 histology.  He completed radiation therapy with weekly carboplatin  09/04/2018 through 10/25/2018. Cystoscopy 06/09/2023-nodular lesion left anterior bladder wall, 2 other smaller  lesions to the right CTs 07/03/2023-negative for metastatic disease Cystoscopy/TURBT 07/26/2023-scattered patches of sessile appearing tumors with associated dystrophic calcifications along the left anterior bladder wall and dome; additional small patches of sessile appearing tumor along the posterior bladder wall left greater than right and right anterior bladder wall; entirety of the left anterior sessile tumor was resected, remainder of the tumors were cauterized, pathology-high-grade invasive urothelial carcinoma, muscularis propria not present, lymphovascular invasion not identified, tumor invades at least lamina propria.  PD-L1 CPS 3 Cycle 1 Pembrolizumab  08/29/2023 Cycle 2 Pembrolizumab  09/19/2023 Cycle 3 pembrolizumab  10/11/2023 Cycle 4 pembrolizumab  11/04/2023 Recent diagnosis of Parkinson's, followed by Dr. Evonnie Hypothyroid     Disposition: Anthony Pacheco is tolerating the pembrolizumab  well.  There is no clinical evidence for recurrence of the bladder cancer.  He will see Dr. Nicholaus for management of urinary frequency.  We will asked Dr. Nicholaus to schedule a surveillance cystoscopy within the next few months. Anthony Pacheco return for an office visit and pembrolizumab  in 3 weeks.  Anthony Hof, MD  11/04/2023  9:01 AM

## 2023-11-04 NOTE — Patient Instructions (Signed)
 [facility] - A DEPT OF [facility]  Discharge Instructions: Thank you for choosing [facility] to provide your oncology and hematology care.   If you have a lab appointment with the Cancer Center, please go directly to the Cancer Center and check in at the registration area.   Wear comfortable clothing and clothing appropriate for easy access to any Portacath or PICC line.   We strive to give you quality time with your provider. You may need to reschedule your appointment if you arrive late (15 or more minutes).  Arriving late affects you and other patients whose appointments are after yours.  Also, if you miss three or more appointments without notifying the office, you may be dismissed from the clinic at the provider's discretion.      For prescription refill requests, have your pharmacy contact our office and allow 72 hours for refills to be completed.    Today you received the following chemotherapy and/or immunotherapy agents KEYTRUDA      To help prevent nausea and vomiting after your treatment, we encourage you to take your nausea medication as directed.  BELOW ARE SYMPTOMS THAT SHOULD BE REPORTED IMMEDIATELY: *FEVER GREATER THAN 100.4 F (38 C) OR HIGHER *CHILLS OR SWEATING *NAUSEA AND VOMITING THAT IS NOT CONTROLLED WITH YOUR NAUSEA MEDICATION *UNUSUAL SHORTNESS OF BREATH *UNUSUAL BRUISING OR BLEEDING *URINARY PROBLEMS (pain or burning when urinating, or frequent urination) *BOWEL PROBLEMS (unusual diarrhea, constipation, pain near the anus) TENDERNESS IN MOUTH AND THROAT WITH OR WITHOUT PRESENCE OF ULCERS (sore throat, sores in mouth, or a toothache) UNUSUAL RASH, SWELLING OR PAIN  UNUSUAL VAGINAL DISCHARGE OR ITCHING   Items with * indicate a potential emergency and should be followed up as soon as possible or go to the Emergency Department if any problems should occur.  Please show the CHEMOTHERAPY ALERT CARD or IMMUNOTHERAPY  ALERT CARD at check-in to the Emergency Department and triage nurse.  Should you have questions after your visit or need to cancel or reschedule your appointment, please contact [facility] - A DEPT OF [facility]  Dept: [phone]  and follow the prompts.  Office hours are 8:00 a.m. to 4:30 p.m. Monday - Friday. Please note that voicemails left after 4:00 p.m. may not be returned until the following business day.  We are closed weekends and major holidays. You have access to a nurse at all times for urgent questions. Please call the main number to the clinic Dept: [phone] and follow the prompts.   For any non-urgent questions, you may also contact your provider using MyChart. We now offer e-Visits for anyone 38 and older to request care online for non-urgent symptoms. For details visit [email or web address].   Also download the MyChart app! Go to the app store, search "MyChart", open the app, select [place], and log in with your MyChart username and password.  Pembrolizumab Injection What is this medication? PEMBROLIZUMAB (PEM broe LIZ ue mab) treats some types of cancer. It works by helping your immune system slow or stop the spread of cancer cells. It is a monoclonal antibody. This medicine may be used for other purposes; ask your health care provider or pharmacist if you have questions. COMMON BRAND NAME(S): Keytruda What should I tell my care team before I take this medication? They need to know if you have any of these conditions: Allogeneic stem cell transplant (uses someone else's stem cells) Autoimmune diseases, such as  Crohn disease, ulcerative colitis, lupus History of chest radiation Nervous system problems, such as Guillain-Barre syndrome, myasthenia gravis Organ transplant An unusual or allergic reaction to pembrolizumab, other medications, foods, dyes, or preservatives Pregnant or trying to get pregnant Breast-feeding How should I  use this medication? This medication is injected into a vein. It is given by your care team in a hospital or clinic setting. A special MedGuide will be given to you before each treatment. Be sure to read this information carefully each time. Talk to your care team about the use of this medication in children. While it may be prescribed for children as young as 6 months for selected conditions, precautions do apply. Overdosage: If you think you have taken too much of this medicine contact a poison control center or emergency room at once. NOTE: This medicine is only for you. Do not share this medicine with others. What if I miss a dose? Keep appointments for follow-up doses. It is important not to miss your dose. Call your care team if you are unable to keep an appointment. What may interact with this medication? Interactions have not been studied. This list may not describe all possible interactions. Give your health care provider a list of all the medicines, herbs, non-prescription drugs, or dietary supplements you use. Also tell them if you smoke, drink alcohol, or use illegal drugs. Some items may interact with your medicine. What should I watch for while using this medication? Your condition will be monitored carefully while you are receiving this medication. You may need blood work while taking this medication. This medication may cause serious skin reactions. They can happen weeks to months after starting the medication. Contact your care team right away if you notice fevers or flu-like symptoms with a rash. The rash may be red or purple and then turn into blisters or peeling of the skin. You may also notice a red rash with swelling of the face, lips, or lymph nodes in your neck or under your arms. Tell your care team right away if you have any change in your eyesight. Talk to your care team if you may be pregnant. Serious birth defects can occur if you take this medication during pregnancy and  for 4 months after the last dose. You will need a negative pregnancy test before starting this medication. Contraception is recommended while taking this medication and for 4 months after the last dose. Your care team can help you find the option that works for you. Do not breastfeed while taking this medication and for 4 months after the last dose. What side effects may I notice from receiving this medication? Side effects that you should report to your care team as soon as possible: Allergic reactions--skin rash, itching, hives, swelling of the face, lips, tongue, or throat Dry cough, shortness of breath or trouble breathing Eye pain, redness, irritation, or discharge with blurry or decreased vision Heart muscle inflammation--unusual weakness or fatigue, shortness of breath, chest pain, fast or irregular heartbeat, dizziness, swelling of the ankles, feet, or hands Hormone gland problems--headache, sensitivity to light, unusual weakness or fatigue, dizziness, fast or irregular heartbeat, increased sensitivity to cold or heat, excessive sweating, constipation, hair loss, increased thirst or amount of urine, tremors or shaking, irritability Infusion reactions--chest pain, shortness of breath or trouble breathing, feeling faint or lightheaded Kidney injury (glomerulonephritis)--decrease in the amount of urine, red or dark brown urine, foamy or bubbly urine, swelling of the ankles, hands, or feet Liver injury--right upper belly  pain, loss of appetite, nausea, light-colored stool, dark yellow or brown urine, yellowing skin or eyes, unusual weakness or fatigue Pain, tingling, or numbness in the hands or feet, muscle weakness, change in vision, confusion or trouble speaking, loss of balance or coordination, trouble walking, seizures Rash, fever, and swollen lymph nodes Redness, blistering, peeling, or loosening of the skin, including inside the mouth Sudden or severe stomach pain, bloody diarrhea, fever,  nausea, vomiting Side effects that usually do not require medical attention (report to your care team if they continue or are bothersome): Bone, joint, or muscle pain Diarrhea Fatigue Loss of appetite Nausea Skin rash This list may not describe all possible side effects. Call your doctor for medical advice about side effects. You may report side effects to FDA at 1-800-FDA-1088. Where should I keep my medication? This medication is given in a hospital or clinic. It will not be stored at home. NOTE: This sheet is a summary. It may not cover all possible information. If you have questions about this medicine, talk to your doctor, pharmacist, or health care provider.  2024 Elsevier/Gold Standard (2022-01-26 00:00:00)

## 2023-11-21 ENCOUNTER — Ambulatory Visit: Payer: Medicare PPO

## 2023-11-21 ENCOUNTER — Ambulatory Visit: Payer: Medicare PPO | Admitting: Nurse Practitioner

## 2023-11-21 ENCOUNTER — Other Ambulatory Visit: Payer: Medicare PPO

## 2023-11-25 ENCOUNTER — Telehealth: Payer: Self-pay

## 2023-11-25 ENCOUNTER — Inpatient Hospital Stay: Payer: Medicare PPO

## 2023-11-25 ENCOUNTER — Inpatient Hospital Stay: Payer: Medicare PPO | Admitting: Nurse Practitioner

## 2023-11-25 ENCOUNTER — Encounter: Payer: Self-pay | Admitting: Nurse Practitioner

## 2023-11-25 VITALS — BP 130/73 | HR 87 | Temp 98.2°F | Resp 18 | Ht 69.0 in | Wt 146.3 lb

## 2023-11-25 VITALS — BP 136/69 | HR 67

## 2023-11-25 DIAGNOSIS — Z5112 Encounter for antineoplastic immunotherapy: Secondary | ICD-10-CM | POA: Diagnosis not present

## 2023-11-25 DIAGNOSIS — C679 Malignant neoplasm of bladder, unspecified: Secondary | ICD-10-CM

## 2023-11-25 LAB — CBC WITH DIFFERENTIAL (CANCER CENTER ONLY)
Abs Immature Granulocytes: 0.05 10*3/uL (ref 0.00–0.07)
Basophils Absolute: 0.1 10*3/uL (ref 0.0–0.1)
Basophils Relative: 1 %
Eosinophils Absolute: 0.2 10*3/uL (ref 0.0–0.5)
Eosinophils Relative: 2 %
HCT: 41.1 % (ref 39.0–52.0)
Hemoglobin: 13 g/dL (ref 13.0–17.0)
Immature Granulocytes: 1 %
Lymphocytes Relative: 19 %
Lymphs Abs: 1.4 10*3/uL (ref 0.7–4.0)
MCH: 30.2 pg (ref 26.0–34.0)
MCHC: 31.6 g/dL (ref 30.0–36.0)
MCV: 95.6 fL (ref 80.0–100.0)
Monocytes Absolute: 0.5 10*3/uL (ref 0.1–1.0)
Monocytes Relative: 7 %
Neutro Abs: 4.9 10*3/uL (ref 1.7–7.7)
Neutrophils Relative %: 70 %
Platelet Count: 218 10*3/uL (ref 150–400)
RBC: 4.3 MIL/uL (ref 4.22–5.81)
RDW: 12.6 % (ref 11.5–15.5)
WBC Count: 7.1 10*3/uL (ref 4.0–10.5)
nRBC: 0 % (ref 0.0–0.2)

## 2023-11-25 LAB — CMP (CANCER CENTER ONLY)
ALT: <5 U/L (ref 0–44)
AST: 12 U/L — ABNORMAL LOW (ref 15–41)
Albumin: 4.3 g/dL (ref 3.5–5.0)
Alkaline Phosphatase: 48 U/L (ref 38–126)
Anion gap: 7 (ref 5–15)
BUN: 17 mg/dL (ref 8–23)
CO2: 28 mmol/L (ref 22–32)
Calcium: 8.9 mg/dL (ref 8.9–10.3)
Chloride: 109 mmol/L (ref 98–111)
Creatinine: 1.47 mg/dL — ABNORMAL HIGH (ref 0.61–1.24)
GFR, Estimated: 46 mL/min — ABNORMAL LOW (ref >60)
Glucose, Bld: 95 mg/dL (ref 70–99)
Potassium: 4 mmol/L (ref 3.5–5.1)
Sodium: 144 mmol/L (ref 135–145)
Total Bilirubin: 0.6 mg/dL (ref 0.0–1.2)
Total Protein: 6.7 g/dL (ref 6.5–8.1)

## 2023-11-25 MED ORDER — SODIUM CHLORIDE 0.9 % IV SOLN: INTRAVENOUS | Status: DC

## 2023-11-25 MED ORDER — SODIUM CHLORIDE 0.9 % IV SOLN
200.0000 mg | Freq: Once | INTRAVENOUS | Status: AC
  Administered 2023-11-25: 200 mg via INTRAVENOUS
  Filled 2023-11-25: qty 8

## 2023-11-25 NOTE — Progress Notes (Signed)
   [facility] OFFICE PROGRESS NOTE   Diagnosis: Bladder cancer  INTERVAL HISTORY:   Anthony Pacheco returns as scheduled.  He completed another cycle of Pembrolizumab 11/04/2023.  He underwent a cystoscopy 11/10/2023-bladder with mild trabeculation with lesions.  On the dome of the bladder there was a lesion approximately 4 cm in diameter with some necrotic debris, probably a persistent cancer.  No other lesions noted.  Next cystoscopy planned for 3 months.  Anthony Pacheco reports feeling well.  No rash.  No diarrhea.  He continues to have frequent urination.  No nausea or vomiting.  No shortness of breath.  Periodic cough.  No fever.  No pain.  Overall good appetite.  Objective:  Vital signs in last 24 hours:  Blood pressure 130/73, pulse 87, temperature 98.2 F (36.8 C), temperature source Temporal, resp. rate 18, height 5\' 9"  (1.753 m), weight 146 lb 4.8 oz (66.4 kg), SpO2 98%.    HEENT: No thrush or ulcers. Resp: Lungs clear bilaterally. Cardio: Regular rate and rhythm. GI: Abdomen soft and nontender.  No hepatosplenomegaly.  No mass. Vascular: No leg edema. Skin: No rash.   Lab Results:  Lab Results  Component Value Date   WBC 7.1 11/25/2023   HGB 13.0 11/25/2023   HCT 41.1 11/25/2023   MCV 95.6 11/25/2023   PLT 218 11/25/2023   NEUTROABS 4.9 11/25/2023    Imaging:  No results found.  Medications: I have reviewed the patient's current medications.  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  TURBT 07/18/2018.  Found to have high-grade invasive urothelial carcinoma; with suspicion for invasion into the muscularis propria, T2 lesion with grade 3 histology.  He completed radiation therapy with weekly carboplatin 09/04/2018 through 10/25/2018. Cystoscopy 06/09/2023-nodular lesion left anterior bladder wall, 2 other smaller lesions to the right CTs 07/03/2023-negative for metastatic disease Cystoscopy/TURBT 07/26/2023-scattered patches of  sessile appearing tumors with associated dystrophic calcifications along the left anterior bladder wall and dome; additional small patches of sessile appearing tumor along the posterior bladder wall left greater than right and right anterior bladder wall; entirety of the left anterior sessile tumor was resected, remainder of the tumors were cauterized, pathology-high-grade invasive urothelial carcinoma, muscularis propria not present, lymphovascular invasion not identified, tumor invades at least lamina propria.  PD-L1 CPS 3 Cycle 1 Pembrolizumab 08/29/2023 Cycle 2 Pembrolizumab 09/19/2023 Cycle 3 pembrolizumab 10/11/2023 Cycle 4 pembrolizumab 11/04/2023 Cystoscopy 11/10/2023-bladder with mild trabeculation with lesions.  On the dome of the bladder there is a lesion measuring approximately 4 cm in diameter with some necrotic debris, probably a persistent cancer.  No other lesions noted. Cycle 5 Pembrolizumab 11/25/2023 Recent diagnosis of Parkinson's, followed by Dr. Arbutus Leas Hypothyroid  Disposition: Anthony Pacheco appears stable.  He has completed 4 cycles of Pembrolizumab.  He is tolerating treatment well.  There is no clinical evidence of disease progression.  Plan to proceed with cycle 5 today as scheduled.  We will follow-up with Dr. Earlene Plater regarding the recent cystoscopy findings.  CBC and chemistry panel reviewed.  Labs adequate to proceed as above.  He will return for follow-up and treatment in 3 weeks.  We are available to see him sooner if needed.    Lonna Cobb ANP/GNP-BC   11/25/2023  8:52 AM

## 2023-11-25 NOTE — Patient Instructions (Signed)
 [facility] - A DEPT OF [facility]  Discharge Instructions: Thank you for choosing [facility] to provide your oncology and hematology care.   If you have a lab appointment with the Cancer Center, please go directly to the Cancer Center and check in at the registration area.   Wear comfortable clothing and clothing appropriate for easy access to any Portacath or PICC line.   We strive to give you quality time with your provider. You may need to reschedule your appointment if you arrive late (15 or more minutes).  Arriving late affects you and other patients whose appointments are after yours.  Also, if you miss three or more appointments without notifying the office, you may be dismissed from the clinic at the provider's discretion.      For prescription refill requests, have your pharmacy contact our office and allow 72 hours for refills to be completed.    Today you received the following chemotherapy and/or immunotherapy agents Keytruda      To help prevent nausea and vomiting after your treatment, we encourage you to take your nausea medication as directed.  BELOW ARE SYMPTOMS THAT SHOULD BE REPORTED IMMEDIATELY: *FEVER GREATER THAN 100.4 F (38 C) OR HIGHER *CHILLS OR SWEATING *NAUSEA AND VOMITING THAT IS NOT CONTROLLED WITH YOUR NAUSEA MEDICATION *UNUSUAL SHORTNESS OF BREATH *UNUSUAL BRUISING OR BLEEDING *URINARY PROBLEMS (pain or burning when urinating, or frequent urination) *BOWEL PROBLEMS (unusual diarrhea, constipation, pain near the anus) TENDERNESS IN MOUTH AND THROAT WITH OR WITHOUT PRESENCE OF ULCERS (sore throat, sores in mouth, or a toothache) UNUSUAL RASH, SWELLING OR PAIN  UNUSUAL VAGINAL DISCHARGE OR ITCHING   Items with * indicate a potential emergency and should be followed up as soon as possible or go to the Emergency Department if any problems should occur.  Please show the CHEMOTHERAPY ALERT CARD or IMMUNOTHERAPY  ALERT CARD at check-in to the Emergency Department and triage nurse.  Should you have questions after your visit or need to cancel or reschedule your appointment, please contact [facility] - A DEPT OF [facility]  Dept: [phone]  and follow the prompts.  Office hours are 8:00 a.m. to 4:30 p.m. Monday - Friday. Please note that voicemails left after 4:00 p.m. may not be returned until the following business day.  We are closed weekends and major holidays. You have access to a nurse at all times for urgent questions. Please call the main number to the clinic Dept: [phone] and follow the prompts.   For any non-urgent questions, you may also contact your provider using MyChart. We now offer e-Visits for anyone 50 and older to request care online for non-urgent symptoms. For details visit [email or web address].   Also download the MyChart app! Go to the app store, search "MyChart", open the app, select Why, and log in with your MyChart username and password.  Pembrolizumab Injection What is this medication? PEMBROLIZUMAB (PEM broe LIZ ue mab) treats some types of cancer. It works by helping your immune system slow or stop the spread of cancer cells. It is a monoclonal antibody. This medicine may be used for other purposes; ask your health care provider or pharmacist if you have questions. COMMON BRAND NAME(S): Keytruda What should I tell my care team before I take this medication? They need to know if you have any of these conditions: Allogeneic stem cell transplant (uses someone else's stem cells) Autoimmune diseases, such as  Crohn disease, ulcerative colitis, lupus History of chest radiation Nervous system problems, such as Guillain-Barre syndrome, myasthenia gravis Organ transplant An unusual or allergic reaction to pembrolizumab, other medications, foods, dyes, or preservatives Pregnant or trying to get pregnant Breast-feeding How should I  use this medication? This medication is injected into a vein. It is given by your care team in a hospital or clinic setting. A special MedGuide will be given to you before each treatment. Be sure to read this information carefully each time. Talk to your care team about the use of this medication in children. While it may be prescribed for children as young as 6 months for selected conditions, precautions do apply. Overdosage: If you think you have taken too much of this medicine contact a poison control center or emergency room at once. NOTE: This medicine is only for you. Do not share this medicine with others. What if I miss a dose? Keep appointments for follow-up doses. It is important not to miss your dose. Call your care team if you are unable to keep an appointment. What may interact with this medication? Interactions have not been studied. This list may not describe all possible interactions. Give your health care provider a list of all the medicines, herbs, non-prescription drugs, or dietary supplements you use. Also tell them if you smoke, drink alcohol, or use illegal drugs. Some items may interact with your medicine. What should I watch for while using this medication? Your condition will be monitored carefully while you are receiving this medication. You may need blood work while taking this medication. This medication may cause serious skin reactions. They can happen weeks to months after starting the medication. Contact your care team right away if you notice fevers or flu-like symptoms with a rash. The rash may be red or purple and then turn into blisters or peeling of the skin. You may also notice a red rash with swelling of the face, lips, or lymph nodes in your neck or under your arms. Tell your care team right away if you have any change in your eyesight. Talk to your care team if you may be pregnant. Serious birth defects can occur if you take this medication during pregnancy and  for 4 months after the last dose. You will need a negative pregnancy test before starting this medication. Contraception is recommended while taking this medication and for 4 months after the last dose. Your care team can help you find the option that works for you. Do not breastfeed while taking this medication and for 4 months after the last dose. What side effects may I notice from receiving this medication? Side effects that you should report to your care team as soon as possible: Allergic reactions--skin rash, itching, hives, swelling of the face, lips, tongue, or throat Dry cough, shortness of breath or trouble breathing Eye pain, redness, irritation, or discharge with blurry or decreased vision Heart muscle inflammation--unusual weakness or fatigue, shortness of breath, chest pain, fast or irregular heartbeat, dizziness, swelling of the ankles, feet, or hands Hormone gland problems--headache, sensitivity to light, unusual weakness or fatigue, dizziness, fast or irregular heartbeat, increased sensitivity to cold or heat, excessive sweating, constipation, hair loss, increased thirst or amount of urine, tremors or shaking, irritability Infusion reactions--chest pain, shortness of breath or trouble breathing, feeling faint or lightheaded Kidney injury (glomerulonephritis)--decrease in the amount of urine, red or dark brown urine, foamy or bubbly urine, swelling of the ankles, hands, or feet Liver injury--right upper belly  pain, loss of appetite, nausea, light-colored stool, dark yellow or brown urine, yellowing skin or eyes, unusual weakness or fatigue Pain, tingling, or numbness in the hands or feet, muscle weakness, change in vision, confusion or trouble speaking, loss of balance or coordination, trouble walking, seizures Rash, fever, and swollen lymph nodes Redness, blistering, peeling, or loosening of the skin, including inside the mouth Sudden or severe stomach pain, bloody diarrhea, fever,  nausea, vomiting Side effects that usually do not require medical attention (report to your care team if they continue or are bothersome): Bone, joint, or muscle pain Diarrhea Fatigue Loss of appetite Nausea Skin rash This list may not describe all possible side effects. Call your doctor for medical advice about side effects. You may report side effects to FDA at 1-800-FDA-1088. Where should I keep my medication? This medication is given in a hospital or clinic. It will not be stored at home. NOTE: This sheet is a summary. It may not cover all possible information. If you have questions about this medicine, talk to your doctor, pharmacist, or health care provider.  2024 Elsevier/Gold Standard (2022-01-26 00:00:00)

## 2023-11-25 NOTE — Telephone Encounter (Signed)
 I contacted [facility] at [facility] and requested that Dr. Earlene Plater reach out to Dr. Truett Perna to discuss the shared patient.

## 2023-11-25 NOTE — Progress Notes (Signed)
 Patient seen by Lonna Cobb NP today  Vitals are within treatment parameters:Yes   Labs are within treatment parameters: Yes creatinine 1.4 is ok to proceed  Treatment plan has been signed: Yes   Per physician team, Patient is ready for treatment and there are NO modifications to the treatment plan.

## 2023-12-01 ENCOUNTER — Ambulatory Visit: Payer: Medicare PPO | Admitting: Nurse Practitioner

## 2023-12-11 ENCOUNTER — Other Ambulatory Visit: Payer: Self-pay | Admitting: Oncology

## 2023-12-16 ENCOUNTER — Inpatient Hospital Stay: Payer: Medicare PPO

## 2023-12-16 ENCOUNTER — Inpatient Hospital Stay: Payer: Medicare PPO | Admitting: Oncology

## 2023-12-16 ENCOUNTER — Inpatient Hospital Stay: Payer: Medicare PPO | Attending: Nurse Practitioner

## 2023-12-16 VITALS — BP 122/67 | HR 61 | Temp 97.7°F | Resp 18

## 2023-12-16 VITALS — BP 112/71 | HR 92 | Temp 98.2°F | Resp 18 | Ht 69.0 in | Wt 144.2 lb

## 2023-12-16 DIAGNOSIS — Z923 Personal history of irradiation: Secondary | ICD-10-CM | POA: Insufficient documentation

## 2023-12-16 DIAGNOSIS — Z5112 Encounter for antineoplastic immunotherapy: Secondary | ICD-10-CM | POA: Insufficient documentation

## 2023-12-16 DIAGNOSIS — Z79899 Other long term (current) drug therapy: Secondary | ICD-10-CM | POA: Insufficient documentation

## 2023-12-16 DIAGNOSIS — E039 Hypothyroidism, unspecified: Secondary | ICD-10-CM | POA: Insufficient documentation

## 2023-12-16 DIAGNOSIS — C679 Malignant neoplasm of bladder, unspecified: Secondary | ICD-10-CM

## 2023-12-16 DIAGNOSIS — G20A1 Parkinson's disease without dyskinesia, without mention of fluctuations: Secondary | ICD-10-CM | POA: Insufficient documentation

## 2023-12-16 LAB — CBC WITH DIFFERENTIAL (CANCER CENTER ONLY)
Abs Immature Granulocytes: 0.04 10*3/uL (ref 0.00–0.07)
Basophils Absolute: 0.1 10*3/uL (ref 0.0–0.1)
Basophils Relative: 2 %
Eosinophils Absolute: 0.2 10*3/uL (ref 0.0–0.5)
Eosinophils Relative: 2 %
HCT: 40.2 % (ref 39.0–52.0)
Hemoglobin: 13 g/dL (ref 13.0–17.0)
Immature Granulocytes: 1 %
Lymphocytes Relative: 22 %
Lymphs Abs: 1.5 10*3/uL (ref 0.7–4.0)
MCH: 30.3 pg (ref 26.0–34.0)
MCHC: 32.3 g/dL (ref 30.0–36.0)
MCV: 93.7 fL (ref 80.0–100.0)
Monocytes Absolute: 0.5 10*3/uL (ref 0.1–1.0)
Monocytes Relative: 7 %
Neutro Abs: 4.6 10*3/uL (ref 1.7–7.7)
Neutrophils Relative %: 66 %
Platelet Count: 241 10*3/uL (ref 150–400)
RBC: 4.29 MIL/uL (ref 4.22–5.81)
RDW: 12.5 % (ref 11.5–15.5)
WBC Count: 6.9 10*3/uL (ref 4.0–10.5)
nRBC: 0 % (ref 0.0–0.2)

## 2023-12-16 LAB — TSH: TSH: 0.115 u[IU]/mL — ABNORMAL LOW (ref 0.350–4.500)

## 2023-12-16 LAB — CMP (CANCER CENTER ONLY)
ALT: <5 U/L (ref 0–44)
AST: 12 U/L — ABNORMAL LOW (ref 15–41)
Albumin: 4.1 g/dL (ref 3.5–5.0)
Alkaline Phosphatase: 55 U/L (ref 38–126)
Anion gap: 8 (ref 5–15)
BUN: 18 mg/dL (ref 8–23)
CO2: 28 mmol/L (ref 22–32)
Calcium: 9.2 mg/dL (ref 8.9–10.3)
Chloride: 108 mmol/L (ref 98–111)
Creatinine: 1.49 mg/dL — ABNORMAL HIGH (ref 0.61–1.24)
GFR, Estimated: 46 mL/min — ABNORMAL LOW (ref >60)
Glucose, Bld: 86 mg/dL (ref 70–99)
Potassium: 4 mmol/L (ref 3.5–5.1)
Sodium: 144 mmol/L (ref 135–145)
Total Bilirubin: 0.7 mg/dL (ref 0.0–1.2)
Total Protein: 6.9 g/dL (ref 6.5–8.1)

## 2023-12-16 MED ORDER — SODIUM CHLORIDE 0.9 % IV SOLN: INTRAVENOUS | Status: DC

## 2023-12-16 MED ORDER — SODIUM CHLORIDE 0.9 % IV SOLN
200.0000 mg | Freq: Once | INTRAVENOUS | Status: AC
  Administered 2023-12-16: 200 mg via INTRAVENOUS
  Filled 2023-12-16: qty 8

## 2023-12-16 NOTE — Progress Notes (Signed)
 Patient seen by Dr. Thornton Papas today  Vitals are within treatment parameters:Yes   Labs are within treatment parameters: Yes   Treatment plan has been signed: Yes   Per physician team, Patient is ready for treatment and there are NO modifications to the treatment plan.

## 2023-12-16 NOTE — Progress Notes (Signed)
  [facility] OFFICE PROGRESS NOTE   Diagnosis: Bladder cancer  INTERVAL HISTORY:   Mr. Arizmendi completed another treatment pembrolizumab on 11/25/2023.  He feels well.  No rash or diarrhea.  No hematuria.  He has nocturia.  He reports eating less than he has in the past.  Objective:  Vital signs in last 24 hours:  Blood pressure 112/71, pulse 92, temperature 98.2 F (36.8 C), temperature source Temporal, resp. rate 18, height 5\' 9"  (1.753 m), weight 144 lb 3.2 oz (65.4 kg), SpO2 98%.    Resp: Lungs clear bilaterally Cardio: Regular rate and rhythm GI: Nontender, no hepatosplenomegaly, no mass Vascular: No leg edema     Lab Results:  Lab Results  Component Value Date   WBC 6.9 12/16/2023   HGB 13.0 12/16/2023   HCT 40.2 12/16/2023   MCV 93.7 12/16/2023   PLT 241 12/16/2023   NEUTROABS 4.6 12/16/2023    CMP  Lab Results  Component Value Date   NA 144 12/16/2023   K 4.0 12/16/2023   CL 108 12/16/2023   CO2 28 12/16/2023   GLUCOSE 86 12/16/2023   BUN 18 12/16/2023   CREATININE 1.49 (H) 12/16/2023   CALCIUM 9.2 12/16/2023   PROT 6.9 12/16/2023   ALBUMIN 4.1 12/16/2023   AST 12 (L) 12/16/2023   ALT <5 12/16/2023   ALKPHOS 55 12/16/2023   BILITOT 0.7 12/16/2023   GFRNONAA 46 (L) 12/16/2023   GFRAA 52 (L) 05/20/2020    Medications: I have reviewed the patient's current medications.   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  TURBT 07/18/2018.  Found to have high-grade invasive urothelial carcinoma; with suspicion for invasion into the muscularis propria, T2 lesion with grade 3 histology.  He completed radiation therapy with weekly carboplatin 09/04/2018 through 10/25/2018. Cystoscopy 06/09/2023-nodular lesion left anterior bladder wall, 2 other smaller lesions to the right CTs 07/03/2023-negative for metastatic disease Cystoscopy/TURBT 07/26/2023-scattered patches of sessile appearing tumors with associated dystrophic  calcifications along the left anterior bladder wall and dome; additional small patches of sessile appearing tumor along the posterior bladder wall left greater than right and right anterior bladder wall; entirety of the left anterior sessile tumor was resected, remainder of the tumors were cauterized, pathology-high-grade invasive urothelial carcinoma, muscularis propria not present, lymphovascular invasion not identified, tumor invades at least lamina propria.  PD-L1 CPS 3 Cycle 1 Pembrolizumab 08/29/2023 Cycle 2 Pembrolizumab 09/19/2023 Cycle 3 pembrolizumab 10/11/2023 Cycle 4 pembrolizumab 11/04/2023 Cystoscopy 11/10/2023-bladder with mild trabeculation with lesions.  On the dome of the bladder there is a lesion measuring approximately 4 cm in diameter with some necrotic debris, probably a persistent cancer.  No other lesions noted. Cycle 5 Pembrolizumab 11/25/2023 Cycle 6 pembrolizumab 12/16/2023 Recent diagnosis of Parkinson's, followed by Dr. Arbutus Leas Hypothyroid    Disposition: Mr. Italiano appears stable.  He will complete another treatment with pembrolizumab today.  He will undergo restaging CTs prior to an office visit in 3 weeks.  We will ask Dr. Earlene Plater to repeat a cystoscopy within the next several months with biopsy of any residual mass.  He has lost approximately 6 pounds over the past several months.  We will monitor for further weight loss.  We will follow-up on the thyroid function studies from today.  Thornton Papas, MD  12/16/2023  9:39 AM

## 2023-12-16 NOTE — Progress Notes (Signed)
 Patient presents today for chemotherapy/immunotherapy infusion of Keytruda. Patient is in satisfactory condition with no new complaints voiced.  Vital signs are stable.  Labs reviewed by Dr. Truett Perna during the office visit and all labs are within treatment parameters.  We will proceed with treatment per MD orders.   Patient tolerated treatment well with no complaints voiced.  Patient left ambulatory in stable condition.  Vital signs stable at discharge.  Follow up as scheduled.

## 2023-12-16 NOTE — Patient Instructions (Signed)
 [facility] - A DEPT OF [facility]  Discharge Instructions: Thank you for choosing [facility] to provide your oncology and hematology care.   If you have a lab appointment with the Cancer Center, please go directly to the Cancer Center and check in at the registration area.   Wear comfortable clothing and clothing appropriate for easy access to any Portacath or PICC line.   We strive to give you quality time with your provider. You may need to reschedule your appointment if you arrive late (15 or more minutes).  Arriving late affects you and other patients whose appointments are after yours.  Also, if you miss three or more appointments without notifying the office, you may be dismissed from the clinic at the provider's discretion.      For prescription refill requests, have your pharmacy contact our office and allow 72 hours for refills to be completed.    Today you received the following chemotherapy and/or immunotherapy agents Keytruda.  Pembrolizumab Injection What is this medication? PEMBROLIZUMAB (PEM broe LIZ ue mab) treats some types of cancer. It works by helping your immune system slow or stop the spread of cancer cells. It is a monoclonal antibody. This medicine may be used for other purposes; ask your health care provider or pharmacist if you have questions. COMMON BRAND NAME(S): Keytruda What should I tell my care team before I take this medication? They need to know if you have any of these conditions: Allogeneic stem cell transplant (uses someone else's stem cells) Autoimmune diseases, such as Crohn disease, ulcerative colitis, lupus History of chest radiation Nervous system problems, such as Guillain-Barre syndrome, myasthenia gravis Organ transplant An unusual or allergic reaction to pembrolizumab, other medications, foods, dyes, or preservatives Pregnant or trying to get pregnant Breast-feeding How should I use this  medication? This medication is injected into a vein. It is given by your care team in a hospital or clinic setting. A special MedGuide will be given to you before each treatment. Be sure to read this information carefully each time. Talk to your care team about the use of this medication in children. While it may be prescribed for children as young as 6 months for selected conditions, precautions do apply. Overdosage: If you think you have taken too much of this medicine contact a poison control center or emergency room at once. NOTE: This medicine is only for you. Do not share this medicine with others. What if I miss a dose? Keep appointments for follow-up doses. It is important not to miss your dose. Call your care team if you are unable to keep an appointment. What may interact with this medication? Interactions have not been studied. This list may not describe all possible interactions. Give your health care provider a list of all the medicines, herbs, non-prescription drugs, or dietary supplements you use. Also tell them if you smoke, drink alcohol, or use illegal drugs. Some items may interact with your medicine. What should I watch for while using this medication? Your condition will be monitored carefully while you are receiving this medication. You may need blood work while taking this medication. This medication may cause serious skin reactions. They can happen weeks to months after starting the medication. Contact your care team right away if you notice fevers or flu-like symptoms with a rash. The rash may be red or purple and then turn into blisters or peeling of the skin. You may also notice a red  rash with swelling of the face, lips, or lymph nodes in your neck or under your arms. Tell your care team right away if you have any change in your eyesight. Talk to your care team if you may be pregnant. Serious birth defects can occur if you take this medication during pregnancy and for 4  months after the last dose. You will need a negative pregnancy test before starting this medication. Contraception is recommended while taking this medication and for 4 months after the last dose. Your care team can help you find the option that works for you. Do not breastfeed while taking this medication and for 4 months after the last dose. What side effects may I notice from receiving this medication? Side effects that you should report to your care team as soon as possible: Allergic reactions--skin rash, itching, hives, swelling of the face, lips, tongue, or throat Dry cough, shortness of breath or trouble breathing Eye pain, redness, irritation, or discharge with blurry or decreased vision Heart muscle inflammation--unusual weakness or fatigue, shortness of breath, chest pain, fast or irregular heartbeat, dizziness, swelling of the ankles, feet, or hands Hormone gland problems--headache, sensitivity to light, unusual weakness or fatigue, dizziness, fast or irregular heartbeat, increased sensitivity to cold or heat, excessive sweating, constipation, hair loss, increased thirst or amount of urine, tremors or shaking, irritability Infusion reactions--chest pain, shortness of breath or trouble breathing, feeling faint or lightheaded Kidney injury (glomerulonephritis)--decrease in the amount of urine, red or dark brown urine, foamy or bubbly urine, swelling of the ankles, hands, or feet Liver injury--right upper belly pain, loss of appetite, nausea, light-colored stool, dark yellow or brown urine, yellowing skin or eyes, unusual weakness or fatigue Pain, tingling, or numbness in the hands or feet, muscle weakness, change in vision, confusion or trouble speaking, loss of balance or coordination, trouble walking, seizures Rash, fever, and swollen lymph nodes Redness, blistering, peeling, or loosening of the skin, including inside the mouth Sudden or severe stomach pain, bloody diarrhea, fever, nausea,  vomiting Side effects that usually do not require medical attention (report to your care team if they continue or are bothersome): Bone, joint, or muscle pain Diarrhea Fatigue Loss of appetite Nausea Skin rash This list may not describe all possible side effects. Call your doctor for medical advice about side effects. You may report side effects to FDA at 1-800-FDA-1088. Where should I keep my medication? This medication is given in a hospital or clinic. It will not be stored at home. NOTE: This sheet is a summary. It may not cover all possible information. If you have questions about this medicine, talk to your doctor, pharmacist, or health care provider.  2024 Elsevier/Gold Standard (2022-01-26 00:00:00)   To help prevent nausea and vomiting after your treatment, we encourage you to take your nausea medication as directed.  BELOW ARE SYMPTOMS THAT SHOULD BE REPORTED IMMEDIATELY: *FEVER GREATER THAN 100.4 F (38 C) OR HIGHER *CHILLS OR SWEATING *NAUSEA AND VOMITING THAT IS NOT CONTROLLED WITH YOUR NAUSEA MEDICATION *UNUSUAL SHORTNESS OF BREATH *UNUSUAL BRUISING OR BLEEDING *URINARY PROBLEMS (pain or burning when urinating, or frequent urination) *BOWEL PROBLEMS (unusual diarrhea, constipation, pain near the anus) TENDERNESS IN MOUTH AND THROAT WITH OR WITHOUT PRESENCE OF ULCERS (sore throat, sores in mouth, or a toothache) UNUSUAL RASH, SWELLING OR PAIN  UNUSUAL VAGINAL DISCHARGE OR ITCHING   Items with * indicate a potential emergency and should be followed up as soon as possible or go to the Emergency Department if any  problems should occur.  Please show the CHEMOTHERAPY ALERT CARD or IMMUNOTHERAPY ALERT CARD at check-in to the Emergency Department and triage nurse.  Should you have questions after your visit or need to cancel or reschedule your appointment, please contact [facility] A DEPT OF [facility]  Dept: [phone]  and follow the  prompts.  Office hours are 8:00 a.m. to 4:30 p.m. Monday - Friday. Please note that voicemails left after 4:00 p.m. may not be returned until the following business day.  We are closed weekends and major holidays. You have access to a nurse at all times for urgent questions. Please call the main number to the clinic Dept: [phone] and follow the prompts.   For any non-urgent questions, you may also contact your provider using MyChart. We now offer e-Visits for anyone 39 and older to request care online for non-urgent symptoms. For details visit [email or web address].   Also download the MyChart app! Go to the app store, search "MyChart", open the app, select [facility], and log in with your MyChart username and password.

## 2023-12-17 LAB — T4: T4, Total: 11 ug/dL (ref 4.5–12.0)

## 2023-12-30 ENCOUNTER — Ambulatory Visit (HOSPITAL_BASED_OUTPATIENT_CLINIC_OR_DEPARTMENT_OTHER)
Admission: RE | Admit: 2023-12-30 | Discharge: 2023-12-30 | Disposition: A | Discharge status: Home / Self Care | Source: Ambulatory Visit | Attending: Oncology | Admitting: Oncology

## 2023-12-30 DIAGNOSIS — C679 Malignant neoplasm of bladder, unspecified: Secondary | ICD-10-CM | POA: Insufficient documentation

## 2024-01-04 ENCOUNTER — Telehealth: Payer: Self-pay | Admitting: *Deleted

## 2024-01-04 NOTE — Telephone Encounter (Signed)
-----   Message from Thornton Papas sent at 01/03/2024  4:43 PM EDT ----- Please call patient CT showed no notes of recurrent bladder cancer, follow-up as scheduled

## 2024-01-04 NOTE — Telephone Encounter (Signed)
 Left VM to check MyChart for CT results.

## 2024-01-06 ENCOUNTER — Inpatient Hospital Stay: Payer: Medicare PPO | Attending: Nurse Practitioner

## 2024-01-06 ENCOUNTER — Inpatient Hospital Stay: Payer: Medicare PPO

## 2024-01-06 ENCOUNTER — Encounter: Payer: Self-pay | Admitting: Nurse Practitioner

## 2024-01-06 ENCOUNTER — Inpatient Hospital Stay: Payer: Medicare PPO | Admitting: Nurse Practitioner

## 2024-01-06 VITALS — BP 122/75 | HR 78 | Temp 98.2°F | Resp 18 | Ht 69.0 in | Wt 145.0 lb

## 2024-01-06 VITALS — BP 153/69 | HR 83 | Resp 18

## 2024-01-06 DIAGNOSIS — Z5112 Encounter for antineoplastic immunotherapy: Secondary | ICD-10-CM | POA: Insufficient documentation

## 2024-01-06 DIAGNOSIS — C679 Malignant neoplasm of bladder, unspecified: Secondary | ICD-10-CM

## 2024-01-06 DIAGNOSIS — G20A1 Parkinson's disease without dyskinesia, without mention of fluctuations: Secondary | ICD-10-CM | POA: Diagnosis not present

## 2024-01-06 DIAGNOSIS — Z923 Personal history of irradiation: Secondary | ICD-10-CM | POA: Insufficient documentation

## 2024-01-06 DIAGNOSIS — E039 Hypothyroidism, unspecified: Secondary | ICD-10-CM | POA: Diagnosis not present

## 2024-01-06 LAB — CBC WITH DIFFERENTIAL (CANCER CENTER ONLY)
Abs Immature Granulocytes: 0.04 10*3/uL (ref 0.00–0.07)
Basophils Absolute: 0.1 10*3/uL (ref 0.0–0.1)
Basophils Relative: 1 %
Eosinophils Absolute: 0.1 10*3/uL (ref 0.0–0.5)
Eosinophils Relative: 2 %
HCT: 40.4 % (ref 39.0–52.0)
Hemoglobin: 13.1 g/dL (ref 13.0–17.0)
Immature Granulocytes: 1 %
Lymphocytes Relative: 20 %
Lymphs Abs: 1.3 10*3/uL (ref 0.7–4.0)
MCH: 30.8 pg (ref 26.0–34.0)
MCHC: 32.4 g/dL (ref 30.0–36.0)
MCV: 94.8 fL (ref 80.0–100.0)
Monocytes Absolute: 0.5 10*3/uL (ref 0.1–1.0)
Monocytes Relative: 8 %
Neutro Abs: 4.4 10*3/uL (ref 1.7–7.7)
Neutrophils Relative %: 68 %
Platelet Count: 214 10*3/uL (ref 150–400)
RBC: 4.26 MIL/uL (ref 4.22–5.81)
RDW: 12.6 % (ref 11.5–15.5)
WBC Count: 6.5 10*3/uL (ref 4.0–10.5)
nRBC: 0 % (ref 0.0–0.2)

## 2024-01-06 LAB — CMP (CANCER CENTER ONLY)
ALT: <5 U/L (ref 0–44)
AST: 12 U/L — ABNORMAL LOW (ref 15–41)
Albumin: 4.2 g/dL (ref 3.5–5.0)
Alkaline Phosphatase: 51 U/L (ref 38–126)
Anion gap: 6 (ref 5–15)
BUN: 16 mg/dL (ref 8–23)
CO2: 29 mmol/L (ref 22–32)
Calcium: 9 mg/dL (ref 8.9–10.3)
Chloride: 108 mmol/L (ref 98–111)
Creatinine: 1.37 mg/dL — ABNORMAL HIGH (ref 0.61–1.24)
GFR, Estimated: 51 mL/min — ABNORMAL LOW (ref >60)
Glucose, Bld: 82 mg/dL (ref 70–99)
Potassium: 4 mmol/L (ref 3.5–5.1)
Sodium: 143 mmol/L (ref 135–145)
Total Bilirubin: 0.6 mg/dL (ref 0.0–1.2)
Total Protein: 6.8 g/dL (ref 6.5–8.1)

## 2024-01-06 MED ORDER — SODIUM CHLORIDE 0.9 % IV SOLN
200.0000 mg | Freq: Once | INTRAVENOUS | Status: AC
  Administered 2024-01-06: 200 mg via INTRAVENOUS
  Filled 2024-01-06: qty 8

## 2024-01-06 MED ORDER — SODIUM CHLORIDE 0.9 % IV SOLN: INTRAVENOUS | Status: DC

## 2024-01-06 NOTE — Progress Notes (Signed)
 Patient seen by Lonna Cobb NP today  Vitals are within treatment parameters:Yes   Labs are within treatment parameters: Yes   Treatment plan has been signed: Yes   Per physician team, Patient is ready for treatment and there are NO modifications to the treatment plan.

## 2024-01-06 NOTE — Patient Instructions (Signed)
 [facility] - A DEPT OF [facility]  Discharge Instructions: Thank you for choosing [facility] to provide your oncology and hematology care.   If you have a lab appointment with the Cancer Center, please go directly to the Cancer Center and check in at the registration area.   Wear comfortable clothing and clothing appropriate for easy access to any Portacath or PICC line.   We strive to give you quality time with your provider. You may need to reschedule your appointment if you arrive late (15 or more minutes).  Arriving late affects you and other patients whose appointments are after yours.  Also, if you miss three or more appointments without notifying the office, you may be dismissed from the clinic at the provider's discretion.      For prescription refill requests, have your pharmacy contact our office and allow 72 hours for refills to be completed.    Today you received the following chemotherapy and/or immunotherapy agents KEYTRUDA      To help prevent nausea and vomiting after your treatment, we encourage you to take your nausea medication as directed.  BELOW ARE SYMPTOMS THAT SHOULD BE REPORTED IMMEDIATELY: *FEVER GREATER THAN 100.4 F (38 C) OR HIGHER *CHILLS OR SWEATING *NAUSEA AND VOMITING THAT IS NOT CONTROLLED WITH YOUR NAUSEA MEDICATION *UNUSUAL SHORTNESS OF BREATH *UNUSUAL BRUISING OR BLEEDING *URINARY PROBLEMS (pain or burning when urinating, or frequent urination) *BOWEL PROBLEMS (unusual diarrhea, constipation, pain near the anus) TENDERNESS IN MOUTH AND THROAT WITH OR WITHOUT PRESENCE OF ULCERS (sore throat, sores in mouth, or a toothache) UNUSUAL RASH, SWELLING OR PAIN  UNUSUAL VAGINAL DISCHARGE OR ITCHING   Items with * indicate a potential emergency and should be followed up as soon as possible or go to the Emergency Department if any problems should occur.  Please show the CHEMOTHERAPY ALERT CARD or IMMUNOTHERAPY  ALERT CARD at check-in to the Emergency Department and triage nurse.  Should you have questions after your visit or need to cancel or reschedule your appointment, please contact [facility] - A DEPT OF [facility]  Dept: [phone]  and follow the prompts.  Office hours are 8:00 a.m. to 4:30 p.m. Monday - Friday. Please note that voicemails left after 4:00 p.m. may not be returned until the following business day.  We are closed weekends and major holidays. You have access to a nurse at all times for urgent questions. Please call the main number to the clinic Dept: [phone] and follow the prompts.   For any non-urgent questions, you may also contact your provider using MyChart. We now offer e-Visits for anyone 38 and older to request care online for non-urgent symptoms. For details visit [email or web address].   Also download the MyChart app! Go to the app store, search "MyChart", open the app, select [place], and log in with your MyChart username and password.  Pembrolizumab Injection What is this medication? PEMBROLIZUMAB (PEM broe LIZ ue mab) treats some types of cancer. It works by helping your immune system slow or stop the spread of cancer cells. It is a monoclonal antibody. This medicine may be used for other purposes; ask your health care provider or pharmacist if you have questions. COMMON BRAND NAME(S): Keytruda What should I tell my care team before I take this medication? They need to know if you have any of these conditions: Allogeneic stem cell transplant (uses someone else's stem cells) Autoimmune diseases, such as  Crohn disease, ulcerative colitis, lupus History of chest radiation Nervous system problems, such as Guillain-Barre syndrome, myasthenia gravis Organ transplant An unusual or allergic reaction to pembrolizumab, other medications, foods, dyes, or preservatives Pregnant or trying to get pregnant Breast-feeding How should I  use this medication? This medication is injected into a vein. It is given by your care team in a hospital or clinic setting. A special MedGuide will be given to you before each treatment. Be sure to read this information carefully each time. Talk to your care team about the use of this medication in children. While it may be prescribed for children as young as 6 months for selected conditions, precautions do apply. Overdosage: If you think you have taken too much of this medicine contact a poison control center or emergency room at once. NOTE: This medicine is only for you. Do not share this medicine with others. What if I miss a dose? Keep appointments for follow-up doses. It is important not to miss your dose. Call your care team if you are unable to keep an appointment. What may interact with this medication? Interactions have not been studied. This list may not describe all possible interactions. Give your health care provider a list of all the medicines, herbs, non-prescription drugs, or dietary supplements you use. Also tell them if you smoke, drink alcohol, or use illegal drugs. Some items may interact with your medicine. What should I watch for while using this medication? Your condition will be monitored carefully while you are receiving this medication. You may need blood work while taking this medication. This medication may cause serious skin reactions. They can happen weeks to months after starting the medication. Contact your care team right away if you notice fevers or flu-like symptoms with a rash. The rash may be red or purple and then turn into blisters or peeling of the skin. You may also notice a red rash with swelling of the face, lips, or lymph nodes in your neck or under your arms. Tell your care team right away if you have any change in your eyesight. Talk to your care team if you may be pregnant. Serious birth defects can occur if you take this medication during pregnancy and  for 4 months after the last dose. You will need a negative pregnancy test before starting this medication. Contraception is recommended while taking this medication and for 4 months after the last dose. Your care team can help you find the option that works for you. Do not breastfeed while taking this medication and for 4 months after the last dose. What side effects may I notice from receiving this medication? Side effects that you should report to your care team as soon as possible: Allergic reactions--skin rash, itching, hives, swelling of the face, lips, tongue, or throat Dry cough, shortness of breath or trouble breathing Eye pain, redness, irritation, or discharge with blurry or decreased vision Heart muscle inflammation--unusual weakness or fatigue, shortness of breath, chest pain, fast or irregular heartbeat, dizziness, swelling of the ankles, feet, or hands Hormone gland problems--headache, sensitivity to light, unusual weakness or fatigue, dizziness, fast or irregular heartbeat, increased sensitivity to cold or heat, excessive sweating, constipation, hair loss, increased thirst or amount of urine, tremors or shaking, irritability Infusion reactions--chest pain, shortness of breath or trouble breathing, feeling faint or lightheaded Kidney injury (glomerulonephritis)--decrease in the amount of urine, red or dark brown urine, foamy or bubbly urine, swelling of the ankles, hands, or feet Liver injury--right upper belly  pain, loss of appetite, nausea, light-colored stool, dark yellow or brown urine, yellowing skin or eyes, unusual weakness or fatigue Pain, tingling, or numbness in the hands or feet, muscle weakness, change in vision, confusion or trouble speaking, loss of balance or coordination, trouble walking, seizures Rash, fever, and swollen lymph nodes Redness, blistering, peeling, or loosening of the skin, including inside the mouth Sudden or severe stomach pain, bloody diarrhea, fever,  nausea, vomiting Side effects that usually do not require medical attention (report to your care team if they continue or are bothersome): Bone, joint, or muscle pain Diarrhea Fatigue Loss of appetite Nausea Skin rash This list may not describe all possible side effects. Call your doctor for medical advice about side effects. You may report side effects to FDA at 1-800-FDA-1088. Where should I keep my medication? This medication is given in a hospital or clinic. It will not be stored at home. NOTE: This sheet is a summary. It may not cover all possible information. If you have questions about this medicine, talk to your doctor, pharmacist, or health care provider.  2024 Elsevier/Gold Standard (2022-01-26 00:00:00)

## 2024-01-06 NOTE — Progress Notes (Signed)
  [facility] OFFICE PROGRESS NOTE   Diagnosis: Bladder cancer  INTERVAL HISTORY:   [name] returns as scheduled.  He completed another cycle of Pembrolizumab 12/16/2023.  No rash or diarrhea.  Appetite is the same.  He thinks he has gained a pound.  No fever.  No shortness of breath.  He occasionally coughs when he eats.  No dysphagia.  Energy level is good.  No pain.  Objective:  Vital signs in last 24 hours:  Blood pressure 122/75, pulse 78, temperature 98.2 F (36.8 C), temperature source Temporal, resp. rate 18, height 5\' 9"  (1.753 m), weight 145 lb (65.8 kg), SpO2 100%.    HEENT: No thrush or ulcers. Resp: Lungs clear bilaterally. Cardio: Regular rate and rhythm. GI: Abdomen soft and nontender.  No hepatosplenomegaly. Vascular: No leg edema. Neuro: Alert and oriented. Skin: No rash.   Lab Results:  Lab Results  Component Value Date   WBC 6.5 01/06/2024   HGB 13.1 01/06/2024   HCT 40.4 01/06/2024   MCV 94.8 01/06/2024   PLT 214 01/06/2024   NEUTROABS 4.4 01/06/2024    Imaging:  No results found.  Medications: I have reviewed the patient's current medications.  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  TURBT 07/18/2018.  Found to have high-grade invasive urothelial carcinoma; with suspicion for invasion into the muscularis propria, T2 lesion with grade 3 histology.  He completed radiation therapy with weekly carboplatin 09/04/2018 through 10/25/2018. Cystoscopy 06/09/2023-nodular lesion left anterior bladder wall, 2 other smaller lesions to the right CTs 07/03/2023-negative for metastatic disease Cystoscopy/TURBT 07/26/2023-scattered patches of sessile appearing tumors with associated dystrophic calcifications along the left anterior bladder wall and dome; additional small patches of sessile appearing tumor along the posterior bladder wall left greater than right and right anterior bladder wall; entirety of the left anterior  sessile tumor was resected, remainder of the tumors were cauterized, pathology-high-grade invasive urothelial carcinoma, muscularis propria not present, lymphovascular invasion not identified, tumor invades at least lamina propria.  PD-L1 CPS 3 Cycle 1 Pembrolizumab 08/29/2023 Cycle 2 Pembrolizumab 09/19/2023 Cycle 3 pembrolizumab 10/11/2023 Cycle 4 pembrolizumab 11/04/2023 Cystoscopy 11/10/2023-bladder with mild trabeculation with lesions.  On the dome of the bladder there is a lesion measuring approximately 4 cm in diameter with some necrotic debris, probably a persistent cancer.  No other lesions noted. Cycle 5 Pembrolizumab 11/25/2023 Cycle 6 pembrolizumab 12/16/2023 CTs 12/30/2023-no evidence of metastatic disease in the chest, abdomen, pelvis Cycle 7 Pembrolizumab 01/06/2024 Recent diagnosis of Parkinson's, followed by Dr. Arbutus Leas Hypothyroid  Disposition: Mr. Doane appears stable.  He has completed 6 cycles of Pembrolizumab.  Recent CT scans showed no evidence of metastatic disease.  Plan to continue Pembrolizumab every 3 weeks, cycle 7 today.  He has an appointment with Dr. Earlene Plater in June.  CBC and chemistry panel reviewed.  Labs adequate to proceed as above.  He will return for follow-up and treatment in 3 weeks.    Lonna Cobb ANP/GNP-BC   01/06/2024  10:17 AM

## 2024-01-22 ENCOUNTER — Other Ambulatory Visit: Payer: Self-pay | Admitting: Oncology

## 2024-01-27 ENCOUNTER — Inpatient Hospital Stay

## 2024-01-27 ENCOUNTER — Encounter: Payer: Self-pay | Admitting: Oncology

## 2024-01-27 ENCOUNTER — Inpatient Hospital Stay: Admitting: Oncology

## 2024-01-27 ENCOUNTER — Inpatient Hospital Stay: Attending: Nurse Practitioner

## 2024-01-27 VITALS — BP 130/62 | HR 79 | Temp 98.2°F | Resp 18 | Ht 69.0 in | Wt 143.4 lb

## 2024-01-27 DIAGNOSIS — Z5112 Encounter for antineoplastic immunotherapy: Secondary | ICD-10-CM | POA: Insufficient documentation

## 2024-01-27 DIAGNOSIS — E039 Hypothyroidism, unspecified: Secondary | ICD-10-CM | POA: Diagnosis not present

## 2024-01-27 DIAGNOSIS — C679 Malignant neoplasm of bladder, unspecified: Secondary | ICD-10-CM | POA: Diagnosis present

## 2024-01-27 DIAGNOSIS — Z79899 Other long term (current) drug therapy: Secondary | ICD-10-CM | POA: Diagnosis not present

## 2024-01-27 DIAGNOSIS — Z923 Personal history of irradiation: Secondary | ICD-10-CM | POA: Diagnosis not present

## 2024-01-27 LAB — CMP (CANCER CENTER ONLY)
ALT: <5 U/L (ref 0–44)
AST: 17 U/L (ref 15–41)
Albumin: 4.3 g/dL (ref 3.5–5.0)
Alkaline Phosphatase: 65 U/L (ref 38–126)
Anion gap: 11 (ref 5–15)
BUN: 18 mg/dL (ref 8–23)
CO2: 25 mmol/L (ref 22–32)
Calcium: 9.6 mg/dL (ref 8.9–10.3)
Chloride: 108 mmol/L (ref 98–111)
Creatinine: 1.44 mg/dL — ABNORMAL HIGH (ref 0.61–1.24)
GFR, Estimated: 48 mL/min — ABNORMAL LOW (ref >60)
Glucose, Bld: 82 mg/dL (ref 70–99)
Potassium: 4.1 mmol/L (ref 3.5–5.1)
Sodium: 144 mmol/L (ref 135–145)
Total Bilirubin: 0.4 mg/dL (ref 0.0–1.2)
Total Protein: 6.7 g/dL (ref 6.5–8.1)

## 2024-01-27 LAB — CBC WITH DIFFERENTIAL (CANCER CENTER ONLY)
Abs Immature Granulocytes: 0.04 10*3/uL (ref 0.00–0.07)
Basophils Absolute: 0.1 10*3/uL (ref 0.0–0.1)
Basophils Relative: 1 %
Eosinophils Absolute: 0.2 10*3/uL (ref 0.0–0.5)
Eosinophils Relative: 2 %
HCT: 41.6 % (ref 39.0–52.0)
Hemoglobin: 13.3 g/dL (ref 13.0–17.0)
Immature Granulocytes: 1 %
Lymphocytes Relative: 23 %
Lymphs Abs: 1.7 10*3/uL (ref 0.7–4.0)
MCH: 30.7 pg (ref 26.0–34.0)
MCHC: 32 g/dL (ref 30.0–36.0)
MCV: 96.1 fL (ref 80.0–100.0)
Monocytes Absolute: 0.5 10*3/uL (ref 0.1–1.0)
Monocytes Relative: 7 %
Neutro Abs: 4.9 10*3/uL (ref 1.7–7.7)
Neutrophils Relative %: 66 %
Platelet Count: 225 10*3/uL (ref 150–400)
RBC: 4.33 MIL/uL (ref 4.22–5.81)
RDW: 12.8 % (ref 11.5–15.5)
WBC Count: 7.4 10*3/uL (ref 4.0–10.5)
nRBC: 0 % (ref 0.0–0.2)

## 2024-01-27 MED ORDER — SODIUM CHLORIDE 0.9 % IV SOLN
200.0000 mg | Freq: Once | INTRAVENOUS | Status: AC
  Administered 2024-01-27: 200 mg via INTRAVENOUS
  Filled 2024-01-27: qty 8

## 2024-01-27 MED ORDER — SODIUM CHLORIDE 0.9 % IV SOLN
INTRAVENOUS | Status: DC
Start: 2024-01-27 — End: 2024-01-27

## 2024-01-27 NOTE — Progress Notes (Signed)
 Patient seen by Diana Forster NP today  Vitals are within treatment parameters:Yes   Labs are within treatment parameters: Yes OK to treat with creatinine 1.44  Treatment plan has been signed: Yes   Per physician team, Patient is ready for treatment and there are NO modifications to the treatment plan.

## 2024-01-27 NOTE — Patient Instructions (Signed)
 [facility] - A DEPT OF [facility]  Discharge Instructions: Thank you for choosing [facility] to provide your oncology and hematology care.   If you have a lab appointment with the Cancer Center, please go directly to the Cancer Center and check in at the registration area.   Wear comfortable clothing and clothing appropriate for easy access to any Portacath or PICC line.   We strive to give you quality time with your provider. You may need to reschedule your appointment if you arrive late (15 or more minutes).  Arriving late affects you and other patients whose appointments are after yours.  Also, if you miss three or more appointments without notifying the office, you may be dismissed from the clinic at the provider's discretion.      For prescription refill requests, have your pharmacy contact our office and allow 72 hours for refills to be completed.    Today you received the following chemotherapy and/or immunotherapy agents KEYTRUDA      To help prevent nausea and vomiting after your treatment, we encourage you to take your nausea medication as directed.  BELOW ARE SYMPTOMS THAT SHOULD BE REPORTED IMMEDIATELY: *FEVER GREATER THAN 100.4 F (38 C) OR HIGHER *CHILLS OR SWEATING *NAUSEA AND VOMITING THAT IS NOT CONTROLLED WITH YOUR NAUSEA MEDICATION *UNUSUAL SHORTNESS OF BREATH *UNUSUAL BRUISING OR BLEEDING *URINARY PROBLEMS (pain or burning when urinating, or frequent urination) *BOWEL PROBLEMS (unusual diarrhea, constipation, pain near the anus) TENDERNESS IN MOUTH AND THROAT WITH OR WITHOUT PRESENCE OF ULCERS (sore throat, sores in mouth, or a toothache) UNUSUAL RASH, SWELLING OR PAIN  UNUSUAL VAGINAL DISCHARGE OR ITCHING   Items with * indicate a potential emergency and should be followed up as soon as possible or go to the Emergency Department if any problems should occur.  Please show the CHEMOTHERAPY ALERT CARD or IMMUNOTHERAPY  ALERT CARD at check-in to the Emergency Department and triage nurse.  Should you have questions after your visit or need to cancel or reschedule your appointment, please contact [facility] - A DEPT OF [facility]  Dept: [phone]  and follow the prompts.  Office hours are 8:00 a.m. to 4:30 p.m. Monday - Friday. Please note that voicemails left after 4:00 p.m. may not be returned until the following business day.  We are closed weekends and major holidays. You have access to a nurse at all times for urgent questions. Please call the main number to the clinic Dept: [phone] and follow the prompts.   For any non-urgent questions, you may also contact your provider using MyChart. We now offer e-Visits for anyone 38 and older to request care online for non-urgent symptoms. For details visit [email or web address].   Also download the MyChart app! Go to the app store, search "MyChart", open the app, select [place], and log in with your MyChart username and password.  Pembrolizumab Injection What is this medication? PEMBROLIZUMAB (PEM broe LIZ ue mab) treats some types of cancer. It works by helping your immune system slow or stop the spread of cancer cells. It is a monoclonal antibody. This medicine may be used for other purposes; ask your health care provider or pharmacist if you have questions. COMMON BRAND NAME(S): Keytruda What should I tell my care team before I take this medication? They need to know if you have any of these conditions: Allogeneic stem cell transplant (uses someone else's stem cells) Autoimmune diseases, such as  Crohn disease, ulcerative colitis, lupus History of chest radiation Nervous system problems, such as Guillain-Barre syndrome, myasthenia gravis Organ transplant An unusual or allergic reaction to pembrolizumab, other medications, foods, dyes, or preservatives Pregnant or trying to get pregnant Breast-feeding How should I  use this medication? This medication is injected into a vein. It is given by your care team in a hospital or clinic setting. A special MedGuide will be given to you before each treatment. Be sure to read this information carefully each time. Talk to your care team about the use of this medication in children. While it may be prescribed for children as young as 6 months for selected conditions, precautions do apply. Overdosage: If you think you have taken too much of this medicine contact a poison control center or emergency room at once. NOTE: This medicine is only for you. Do not share this medicine with others. What if I miss a dose? Keep appointments for follow-up doses. It is important not to miss your dose. Call your care team if you are unable to keep an appointment. What may interact with this medication? Interactions have not been studied. This list may not describe all possible interactions. Give your health care provider a list of all the medicines, herbs, non-prescription drugs, or dietary supplements you use. Also tell them if you smoke, drink alcohol, or use illegal drugs. Some items may interact with your medicine. What should I watch for while using this medication? Your condition will be monitored carefully while you are receiving this medication. You may need blood work while taking this medication. This medication may cause serious skin reactions. They can happen weeks to months after starting the medication. Contact your care team right away if you notice fevers or flu-like symptoms with a rash. The rash may be red or purple and then turn into blisters or peeling of the skin. You may also notice a red rash with swelling of the face, lips, or lymph nodes in your neck or under your arms. Tell your care team right away if you have any change in your eyesight. Talk to your care team if you may be pregnant. Serious birth defects can occur if you take this medication during pregnancy and  for 4 months after the last dose. You will need a negative pregnancy test before starting this medication. Contraception is recommended while taking this medication and for 4 months after the last dose. Your care team can help you find the option that works for you. Do not breastfeed while taking this medication and for 4 months after the last dose. What side effects may I notice from receiving this medication? Side effects that you should report to your care team as soon as possible: Allergic reactions--skin rash, itching, hives, swelling of the face, lips, tongue, or throat Dry cough, shortness of breath or trouble breathing Eye pain, redness, irritation, or discharge with blurry or decreased vision Heart muscle inflammation--unusual weakness or fatigue, shortness of breath, chest pain, fast or irregular heartbeat, dizziness, swelling of the ankles, feet, or hands Hormone gland problems--headache, sensitivity to light, unusual weakness or fatigue, dizziness, fast or irregular heartbeat, increased sensitivity to cold or heat, excessive sweating, constipation, hair loss, increased thirst or amount of urine, tremors or shaking, irritability Infusion reactions--chest pain, shortness of breath or trouble breathing, feeling faint or lightheaded Kidney injury (glomerulonephritis)--decrease in the amount of urine, red or dark brown urine, foamy or bubbly urine, swelling of the ankles, hands, or feet Liver injury--right upper belly  pain, loss of appetite, nausea, light-colored stool, dark yellow or brown urine, yellowing skin or eyes, unusual weakness or fatigue Pain, tingling, or numbness in the hands or feet, muscle weakness, change in vision, confusion or trouble speaking, loss of balance or coordination, trouble walking, seizures Rash, fever, and swollen lymph nodes Redness, blistering, peeling, or loosening of the skin, including inside the mouth Sudden or severe stomach pain, bloody diarrhea, fever,  nausea, vomiting Side effects that usually do not require medical attention (report to your care team if they continue or are bothersome): Bone, joint, or muscle pain Diarrhea Fatigue Loss of appetite Nausea Skin rash This list may not describe all possible side effects. Call your doctor for medical advice about side effects. You may report side effects to FDA at 1-800-FDA-1088. Where should I keep my medication? This medication is given in a hospital or clinic. It will not be stored at home. NOTE: This sheet is a summary. It may not cover all possible information. If you have questions about this medicine, talk to your doctor, pharmacist, or health care provider.  2024 Elsevier/Gold Standard (2022-01-26 00:00:00)

## 2024-01-27 NOTE — Progress Notes (Signed)
  [facility] OFFICE PROGRESS NOTE   Diagnosis: Bladder cancer  INTERVAL HISTORY:   Anthony Pacheco returns as scheduled.  He completed another cycle of Pembrolizumab  01/06/2024.  No rash or diarrhea.  He periodically strains for bowel movement.  He estimates having a bowel movement about every 3 days.  He denies nausea/vomiting.  No pain.  No fever.  No shortness of breath.  Continued good appetite.  Objective:  Vital signs in last 24 hours:  Blood pressure 130/62, pulse 79, temperature 98.2 F (36.8 C), temperature source Temporal, resp. rate 18, height 5\' 9"  (1.753 m), weight 143 lb 6.4 oz (65 kg), SpO2 100%.    HEENT: No thrush or ulcers. Resp: Lungs clear bilaterally. Cardio: Regular rate and rhythm. GI: No hepatosplenomegaly. Vascular: No leg edema. Skin: No rash.   Lab Results:  Lab Results  Component Value Date   WBC 7.4 01/27/2024   HGB 13.3 01/27/2024   HCT 41.6 01/27/2024   MCV 96.1 01/27/2024   PLT 225 01/27/2024   NEUTROABS 4.9 01/27/2024    Imaging:  No results found.  Medications: I have reviewed the patient's current medications.  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  TURBT 07/18/2018.  Found to have high-grade invasive urothelial carcinoma; with suspicion for invasion into the muscularis propria, T2 lesion with grade 3 histology.  He completed radiation therapy with weekly carboplatin  09/04/2018 through 10/25/2018. Cystoscopy 06/09/2023-nodular lesion left anterior bladder wall, 2 other smaller lesions to the right CTs 07/03/2023-negative for metastatic disease Cystoscopy/TURBT 07/26/2023-scattered patches of sessile appearing tumors with associated dystrophic calcifications along the left anterior bladder wall and dome; additional small patches of sessile appearing tumor along the posterior bladder wall left greater than right and right anterior bladder wall; entirety of the left anterior sessile tumor was resected,  remainder of the tumors were cauterized, pathology-high-grade invasive urothelial carcinoma, muscularis propria not present, lymphovascular invasion not identified, tumor invades at least lamina propria.  PD-L1 CPS 3 Cycle 1 Pembrolizumab  08/29/2023 Cycle 2 Pembrolizumab  09/19/2023 Cycle 3 pembrolizumab  10/11/2023 Cycle 4 pembrolizumab  11/04/2023 Cystoscopy 11/10/2023-bladder with mild trabeculation with lesions.  On the dome of the bladder there is a lesion measuring approximately 4 cm in diameter with some necrotic debris, probably a persistent cancer.  No other lesions noted. Cycle 5 Pembrolizumab  11/25/2023 Cycle 6 pembrolizumab  12/16/2023 CTs 12/30/2023-no evidence of metastatic disease in the chest, abdomen, pelvis Cycle 7 Pembrolizumab  01/06/2024 Cycle 8 Pembrolizumab  01/27/2024 Recent diagnosis of Parkinson's, followed by Dr. Winferd Hatter Hypothyroid  Disposition: Anthony Pacheco appears stable.  He has completed 7 cycles of Pembrolizumab .  He continues to tolerate well.  There is no clinical evidence of disease progression.  Plan to proceed with cycle 8 today as scheduled.  CBC and chemistry panel reviewed.  Labs adequate for treatment.  He will adjust his laxative regimen for constipation.  He will return for follow-up and Pembrolizumab  in 3 weeks.  Patient seen with Dr. Scherrie Curt.  Diana Forster ANP/GNP-BC   01/27/2024  11:08 AM  This was a shared visit with Diana Forster.  Anthony Pacheco is in clinical remission from bladder cancer.  He is tolerating the pembrolizumab  well.  He will see Dr. Nolon Baxter in June.  The plan is to continue pembrolizumab .  Anise Kerns, MD

## 2024-02-01 ENCOUNTER — Other Ambulatory Visit: Payer: Self-pay

## 2024-02-13 ENCOUNTER — Other Ambulatory Visit: Payer: Self-pay | Admitting: Oncology

## 2024-02-17 ENCOUNTER — Inpatient Hospital Stay: Admitting: Oncology

## 2024-02-17 ENCOUNTER — Other Ambulatory Visit

## 2024-02-17 ENCOUNTER — Encounter: Payer: Self-pay | Admitting: Oncology

## 2024-02-17 ENCOUNTER — Inpatient Hospital Stay

## 2024-02-17 VITALS — BP 111/71 | HR 61 | Temp 98.1°F | Resp 18 | Ht 69.0 in | Wt 145.1 lb

## 2024-02-17 DIAGNOSIS — C679 Malignant neoplasm of bladder, unspecified: Secondary | ICD-10-CM

## 2024-02-17 DIAGNOSIS — Z5112 Encounter for antineoplastic immunotherapy: Secondary | ICD-10-CM | POA: Diagnosis not present

## 2024-02-17 LAB — CMP (CANCER CENTER ONLY)
ALT: <5 U/L (ref 0–44)
AST: 20 U/L (ref 15–41)
Albumin: 4 g/dL (ref 3.5–5.0)
Alkaline Phosphatase: 66 U/L (ref 38–126)
Anion gap: 10 (ref 5–15)
BUN: 20 mg/dL (ref 8–23)
CO2: 25 mmol/L (ref 22–32)
Calcium: 9.4 mg/dL (ref 8.9–10.3)
Chloride: 107 mmol/L (ref 98–111)
Creatinine: 1.44 mg/dL — ABNORMAL HIGH (ref 0.61–1.24)
GFR, Estimated: 48 mL/min — ABNORMAL LOW (ref >60)
Glucose, Bld: 81 mg/dL (ref 70–99)
Potassium: 4.2 mmol/L (ref 3.5–5.1)
Sodium: 142 mmol/L (ref 135–145)
Total Bilirubin: 0.5 mg/dL (ref 0.0–1.2)
Total Protein: 6.5 g/dL (ref 6.5–8.1)

## 2024-02-17 LAB — CBC WITH DIFFERENTIAL (CANCER CENTER ONLY)
Abs Immature Granulocytes: 0.03 10*3/uL (ref 0.00–0.07)
Basophils Absolute: 0.1 10*3/uL (ref 0.0–0.1)
Basophils Relative: 1 %
Eosinophils Absolute: 0.1 10*3/uL (ref 0.0–0.5)
Eosinophils Relative: 2 %
HCT: 38.8 % — ABNORMAL LOW (ref 39.0–52.0)
Hemoglobin: 12.6 g/dL — ABNORMAL LOW (ref 13.0–17.0)
Immature Granulocytes: 1 %
Lymphocytes Relative: 22 %
Lymphs Abs: 1.4 10*3/uL (ref 0.7–4.0)
MCH: 30.2 pg (ref 26.0–34.0)
MCHC: 32.5 g/dL (ref 30.0–36.0)
MCV: 93 fL (ref 80.0–100.0)
Monocytes Absolute: 0.5 10*3/uL (ref 0.1–1.0)
Monocytes Relative: 7 %
Neutro Abs: 4.2 10*3/uL (ref 1.7–7.7)
Neutrophils Relative %: 67 %
Platelet Count: 231 10*3/uL (ref 150–400)
RBC: 4.17 MIL/uL — ABNORMAL LOW (ref 4.22–5.81)
RDW: 12.5 % (ref 11.5–15.5)
WBC Count: 6.2 10*3/uL (ref 4.0–10.5)
nRBC: 0 % (ref 0.0–0.2)

## 2024-02-17 LAB — TSH: TSH: 0.232 u[IU]/mL — ABNORMAL LOW (ref 0.350–4.500)

## 2024-02-17 MED ORDER — SODIUM CHLORIDE 0.9 % IV SOLN
200.0000 mg | Freq: Once | INTRAVENOUS | Status: AC
  Administered 2024-02-17: 200 mg via INTRAVENOUS
  Filled 2024-02-17: qty 8

## 2024-02-17 MED ORDER — SODIUM CHLORIDE 0.9 % IV SOLN: INTRAVENOUS | Status: DC

## 2024-02-17 NOTE — Patient Instructions (Signed)
 [facility] - A DEPT OF [facility]  Discharge Instructions: Thank you for choosing [facility] to provide your oncology and hematology care.   If you have a lab appointment with the Cancer Center, please go directly to the Cancer Center and check in at the registration area.   Wear comfortable clothing and clothing appropriate for easy access to any Portacath or PICC line.   We strive to give you quality time with your provider. You may need to reschedule your appointment if you arrive late (15 or more minutes).  Arriving late affects you and other patients whose appointments are after yours.  Also, if you miss three or more appointments without notifying the office, you may be dismissed from the clinic at the provider's discretion.      For prescription refill requests, have your pharmacy contact our office and allow 72 hours for refills to be completed.    Today you received the following chemotherapy and/or immunotherapy agents KEYTRUDA      To help prevent nausea and vomiting after your treatment, we encourage you to take your nausea medication as directed.  BELOW ARE SYMPTOMS THAT SHOULD BE REPORTED IMMEDIATELY: *FEVER GREATER THAN 100.4 F (38 C) OR HIGHER *CHILLS OR SWEATING *NAUSEA AND VOMITING THAT IS NOT CONTROLLED WITH YOUR NAUSEA MEDICATION *UNUSUAL SHORTNESS OF BREATH *UNUSUAL BRUISING OR BLEEDING *URINARY PROBLEMS (pain or burning when urinating, or frequent urination) *BOWEL PROBLEMS (unusual diarrhea, constipation, pain near the anus) TENDERNESS IN MOUTH AND THROAT WITH OR WITHOUT PRESENCE OF ULCERS (sore throat, sores in mouth, or a toothache) UNUSUAL RASH, SWELLING OR PAIN  UNUSUAL VAGINAL DISCHARGE OR ITCHING   Items with * indicate a potential emergency and should be followed up as soon as possible or go to the Emergency Department if any problems should occur.  Please show the CHEMOTHERAPY ALERT CARD or IMMUNOTHERAPY  ALERT CARD at check-in to the Emergency Department and triage nurse.  Should you have questions after your visit or need to cancel or reschedule your appointment, please contact [facility] - A DEPT OF [facility]  Dept: [phone]  and follow the prompts.  Office hours are 8:00 a.m. to 4:30 p.m. Monday - Friday. Please note that voicemails left after 4:00 p.m. may not be returned until the following business day.  We are closed weekends and major holidays. You have access to a nurse at all times for urgent questions. Please call the main number to the clinic Dept: [phone] and follow the prompts.   For any non-urgent questions, you may also contact your provider using MyChart. We now offer e-Visits for anyone 38 and older to request care online for non-urgent symptoms. For details visit [email or web address].   Also download the MyChart app! Go to the app store, search "MyChart", open the app, select [place], and log in with your MyChart username and password.  Pembrolizumab Injection What is this medication? PEMBROLIZUMAB (PEM broe LIZ ue mab) treats some types of cancer. It works by helping your immune system slow or stop the spread of cancer cells. It is a monoclonal antibody. This medicine may be used for other purposes; ask your health care provider or pharmacist if you have questions. COMMON BRAND NAME(S): Keytruda What should I tell my care team before I take this medication? They need to know if you have any of these conditions: Allogeneic stem cell transplant (uses someone else's stem cells) Autoimmune diseases, such as  Crohn disease, ulcerative colitis, lupus History of chest radiation Nervous system problems, such as Guillain-Barre syndrome, myasthenia gravis Organ transplant An unusual or allergic reaction to pembrolizumab, other medications, foods, dyes, or preservatives Pregnant or trying to get pregnant Breast-feeding How should I  use this medication? This medication is injected into a vein. It is given by your care team in a hospital or clinic setting. A special MedGuide will be given to you before each treatment. Be sure to read this information carefully each time. Talk to your care team about the use of this medication in children. While it may be prescribed for children as young as 6 months for selected conditions, precautions do apply. Overdosage: If you think you have taken too much of this medicine contact a poison control center or emergency room at once. NOTE: This medicine is only for you. Do not share this medicine with others. What if I miss a dose? Keep appointments for follow-up doses. It is important not to miss your dose. Call your care team if you are unable to keep an appointment. What may interact with this medication? Interactions have not been studied. This list may not describe all possible interactions. Give your health care provider a list of all the medicines, herbs, non-prescription drugs, or dietary supplements you use. Also tell them if you smoke, drink alcohol, or use illegal drugs. Some items may interact with your medicine. What should I watch for while using this medication? Your condition will be monitored carefully while you are receiving this medication. You may need blood work while taking this medication. This medication may cause serious skin reactions. They can happen weeks to months after starting the medication. Contact your care team right away if you notice fevers or flu-like symptoms with a rash. The rash may be red or purple and then turn into blisters or peeling of the skin. You may also notice a red rash with swelling of the face, lips, or lymph nodes in your neck or under your arms. Tell your care team right away if you have any change in your eyesight. Talk to your care team if you may be pregnant. Serious birth defects can occur if you take this medication during pregnancy and  for 4 months after the last dose. You will need a negative pregnancy test before starting this medication. Contraception is recommended while taking this medication and for 4 months after the last dose. Your care team can help you find the option that works for you. Do not breastfeed while taking this medication and for 4 months after the last dose. What side effects may I notice from receiving this medication? Side effects that you should report to your care team as soon as possible: Allergic reactions--skin rash, itching, hives, swelling of the face, lips, tongue, or throat Dry cough, shortness of breath or trouble breathing Eye pain, redness, irritation, or discharge with blurry or decreased vision Heart muscle inflammation--unusual weakness or fatigue, shortness of breath, chest pain, fast or irregular heartbeat, dizziness, swelling of the ankles, feet, or hands Hormone gland problems--headache, sensitivity to light, unusual weakness or fatigue, dizziness, fast or irregular heartbeat, increased sensitivity to cold or heat, excessive sweating, constipation, hair loss, increased thirst or amount of urine, tremors or shaking, irritability Infusion reactions--chest pain, shortness of breath or trouble breathing, feeling faint or lightheaded Kidney injury (glomerulonephritis)--decrease in the amount of urine, red or dark brown urine, foamy or bubbly urine, swelling of the ankles, hands, or feet Liver injury--right upper belly  pain, loss of appetite, nausea, light-colored stool, dark yellow or brown urine, yellowing skin or eyes, unusual weakness or fatigue Pain, tingling, or numbness in the hands or feet, muscle weakness, change in vision, confusion or trouble speaking, loss of balance or coordination, trouble walking, seizures Rash, fever, and swollen lymph nodes Redness, blistering, peeling, or loosening of the skin, including inside the mouth Sudden or severe stomach pain, bloody diarrhea, fever,  nausea, vomiting Side effects that usually do not require medical attention (report to your care team if they continue or are bothersome): Bone, joint, or muscle pain Diarrhea Fatigue Loss of appetite Nausea Skin rash This list may not describe all possible side effects. Call your doctor for medical advice about side effects. You may report side effects to FDA at 1-800-FDA-1088. Where should I keep my medication? This medication is given in a hospital or clinic. It will not be stored at home. NOTE: This sheet is a summary. It may not cover all possible information. If you have questions about this medicine, talk to your doctor, pharmacist, or health care provider.  2024 Elsevier/Gold Standard (2022-01-26 00:00:00)

## 2024-02-17 NOTE — Progress Notes (Signed)
 Patient seen by Dr. Thornton Papas today  Vitals are within treatment parameters:Yes   Labs are within treatment parameters: Yes   Treatment plan has been signed: Yes   Per physician team, Patient is ready for treatment and there are NO modifications to the treatment plan.

## 2024-02-17 NOTE — Progress Notes (Signed)
  [facility] OFFICE PROGRESS NOTE   Diagnosis: Bladder cancer  INTERVAL HISTORY:   History Tippit completed another treatment pembrolizumab  01/27/2024.  No rash or diarrhea.  No chest pain or shortness of breath.  He complains of urinary frequency during the day and at night.  He is scheduled to see Dr. Nolon Baxter next month.  Objective:  Vital signs in last 24 hours:  Blood pressure 111/71, pulse 61, temperature 98.1 F (36.7 C), resp. rate 18, height 5\' 9"  (1.753 m), weight 145 lb 1.6 oz (65.8 kg), SpO2 100%.   Resp: Lungs clear bilaterally Cardio: Regular rhythm with premature beats GI: No hepatosplenomegaly Vascular: No leg edema  Skin: No rash  Lab Results:  Lab Results  Component Value Date   WBC 6.2 02/17/2024   HGB 12.6 (L) 02/17/2024   HCT 38.8 (L) 02/17/2024   MCV 93.0 02/17/2024   PLT 231 02/17/2024   NEUTROABS 4.2 02/17/2024    CMP  Lab Results  Component Value Date   NA 142 02/17/2024   K 4.2 02/17/2024   CL 107 02/17/2024   CO2 25 02/17/2024   GLUCOSE 81 02/17/2024   BUN 20 02/17/2024   CREATININE 1.44 (H) 02/17/2024   CALCIUM 9.4 02/17/2024   PROT 6.5 02/17/2024   ALBUMIN 4.0 02/17/2024   AST 20 02/17/2024   ALT <5 02/17/2024   ALKPHOS 66 02/17/2024   BILITOT 0.5 02/17/2024   GFRNONAA 48 (L) 02/17/2024   GFRAA 52 (L) 05/20/2020    Medications: I have reviewed the patient's current medications.   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  TURBT 07/18/2018.  Found to have high-grade invasive urothelial carcinoma; with suspicion for invasion into the muscularis propria, T2 lesion with grade 3 histology.  He completed radiation therapy with weekly carboplatin  09/04/2018 through 10/25/2018. Cystoscopy 06/09/2023-nodular lesion left anterior bladder wall, 2 other smaller lesions to the right CTs 07/03/2023-negative for metastatic disease Cystoscopy/TURBT 07/26/2023-scattered patches of sessile appearing tumors with  associated dystrophic calcifications along the left anterior bladder wall and dome; additional small patches of sessile appearing tumor along the posterior bladder wall left greater than right and right anterior bladder wall; entirety of the left anterior sessile tumor was resected, remainder of the tumors were cauterized, pathology-high-grade invasive urothelial carcinoma, muscularis propria not present, lymphovascular invasion not identified, tumor invades at least lamina propria.  PD-L1 CPS 3 Cycle 1 Pembrolizumab  08/29/2023 Cycle 2 Pembrolizumab  09/19/2023 Cycle 3 pembrolizumab  10/11/2023 Cycle 4 pembrolizumab  11/04/2023 Cystoscopy 11/10/2023-bladder with mild trabeculation with lesions.  On the dome of the bladder there is a lesion measuring approximately 4 cm in diameter with some necrotic debris, probably a persistent cancer.  No other lesions noted. Cycle 5 Pembrolizumab  11/25/2023 Cycle 6 pembrolizumab  12/16/2023 CTs 12/30/2023-no evidence of metastatic disease in the chest, abdomen, pelvis Cycle 7 Pembrolizumab  01/06/2024 Cycle 8 Pembrolizumab  01/27/2024 Cycle 9 pembrolizumab  02/17/2024 Recent diagnosis of Parkinson's, followed by Dr. Winferd Hatter Hypothyroid    Disposition: Mr Erion appears unchanged.  He is tolerating the pembrolizumab  well.  He will pleat another cycle today.  He is scheduled see Dr. Nolon Baxter 09/15/2024 for restaging and evaluation of urinary frequency.  He will return for an office visit and pembrolizumab  in 3 weeks.  Coni Deep, MD  02/17/2024  10:28 AM

## 2024-02-18 LAB — T4: T4, Total: 8.9 ug/dL (ref 4.5–12.0)

## 2024-02-22 ENCOUNTER — Telehealth: Payer: Self-pay | Admitting: Oncology

## 2024-02-22 NOTE — Telephone Encounter (Signed)
 Patient has been scheduled for follow-up visit per 02/22/24 LOS.  Pt aware of scheduled appt details.

## 2024-02-23 ENCOUNTER — Other Ambulatory Visit: Payer: Self-pay

## 2024-03-01 ENCOUNTER — Other Ambulatory Visit: Payer: Self-pay

## 2024-03-06 NOTE — Progress Notes (Unsigned)
    Assessment/Plan:   1.  Parkinsons Disease, diagnosed November, 2021, slightly worsened  -Increase carbidopa /levodopa  25/100, 2 po tid  -We discussed that it used to be thought that levodopa  would increase risk of melanoma but now it is believed that Parkinsons itself likely increases risk of melanoma. he is to get regular skin checks.     2.  Bladder cancer  -On chemotherapy.  Has appt later today  3.  Hypothyroidism, currently effectively overtreated  - Primary care is decreasing his levothyroxine.  Discussed that this can contribute to tremor  Subjective:   Anthony Pacheco was seen today in follow up for Parkinson's disease.  Patient is taking his levodopa  faithfully.  He has had no side effects of medication.  His TSH has continued to be abnormal.  Notes from primary care indicate that they are working on decreasing his levothyroxine.  Tremor is a little worse.  No falls.  No cramping at night.  No syncope.    Current prescribed movement disorder medications: Carbidopa /levodopa  25/100, 2/1/1   PREVIOUS MEDICATIONS: Sinemet   ALLERGIES:  No Known Allergies   Objective:   PHYSICAL EXAMINATION:    VITALS:   Vitals:   03/08/24 0911  BP: 122/82  Pulse: 82  SpO2: 98%  Weight: 144 lb (65.3 kg)     GEN:  The patient appears stated age and is in NAD. HEENT:  Normocephalic, atraumatic.  The mucous membranes are moist. The superficial temporal arteries are without ropiness or tenderness. CV:  RRR Lungs:  CTAB Neck/HEME:  There are no carotid bruits bilaterally.  Neurological examination:  Orientation: The patient is alert and oriented x3. Cranial nerves: There is good facial symmetry with min facial hypomimia. The speech is fluent and clear. Soft palate rises symmetrically and there is no tongue deviation. Hearing is intact to conversational tone. Sensation: Sensation is intact to light touch throughout Motor: Strength is at least antigravity x4.  Movement  examination: Tone: There is mild increased tone in the LUE Abnormal movements: there is LUE rest tremor that increases with distraction.   Coordination:  There is decremation with any form of RAMS, including alternating supination and pronation of the forearm, hand opening and closing, finger taps on the L.  RAMs on the R and in the bilateral LE are good Gait and Station: The patient has no difficulty arising out of a deep-seated chair without the use of the hands. The patient ambulates well today with slightly decreased arm swing.  He is just slightly forward flexed.  This is stable.   I have reviewed and interpreted the following labs independently    Chemistry      Component Value Date/Time   NA 142 02/17/2024 0940   K 4.2 02/17/2024 0940   CL 107 02/17/2024 0940   CO2 25 02/17/2024 0940   BUN 20 02/17/2024 0940   CREATININE 1.44 (H) 02/17/2024 0940      Component Value Date/Time   CALCIUM 9.4 02/17/2024 0940   ALKPHOS 66 02/17/2024 0940   AST 20 02/17/2024 0940   ALT <5 02/17/2024 0940   BILITOT 0.5 02/17/2024 0940       Lab Results  Component Value Date   WBC 6.2 02/17/2024   HGB 12.6 (L) 02/17/2024   HCT 38.8 (L) 02/17/2024   MCV 93.0 02/17/2024   PLT 231 02/17/2024    Lab Results  Component Value Date   TSH 0.232 (L) 02/17/2024     Cc:  Benedetto Brady, MD

## 2024-03-08 ENCOUNTER — Inpatient Hospital Stay: Admitting: Nurse Practitioner

## 2024-03-08 ENCOUNTER — Other Ambulatory Visit

## 2024-03-08 ENCOUNTER — Inpatient Hospital Stay

## 2024-03-08 ENCOUNTER — Encounter: Payer: Self-pay | Admitting: Nurse Practitioner

## 2024-03-08 ENCOUNTER — Ambulatory Visit: Payer: Medicare PPO | Admitting: Neurology

## 2024-03-08 ENCOUNTER — Encounter: Payer: Self-pay | Admitting: Neurology

## 2024-03-08 ENCOUNTER — Inpatient Hospital Stay: Attending: Nurse Practitioner

## 2024-03-08 VITALS — BP 122/82 | HR 82 | Wt 144.0 lb

## 2024-03-08 VITALS — BP 131/75 | HR 80 | Temp 98.2°F | Resp 18 | Ht 69.0 in | Wt 144.3 lb

## 2024-03-08 DIAGNOSIS — Z5112 Encounter for antineoplastic immunotherapy: Secondary | ICD-10-CM | POA: Diagnosis present

## 2024-03-08 DIAGNOSIS — G20A1 Parkinson's disease without dyskinesia, without mention of fluctuations: Secondary | ICD-10-CM

## 2024-03-08 DIAGNOSIS — C679 Malignant neoplasm of bladder, unspecified: Secondary | ICD-10-CM | POA: Diagnosis not present

## 2024-03-08 DIAGNOSIS — E039 Hypothyroidism, unspecified: Secondary | ICD-10-CM | POA: Insufficient documentation

## 2024-03-08 DIAGNOSIS — C673 Malignant neoplasm of anterior wall of bladder: Secondary | ICD-10-CM | POA: Diagnosis not present

## 2024-03-08 DIAGNOSIS — Z923 Personal history of irradiation: Secondary | ICD-10-CM | POA: Insufficient documentation

## 2024-03-08 LAB — CMP (CANCER CENTER ONLY)
ALT: <5 U/L (ref 0–44)
AST: 18 U/L (ref 15–41)
Albumin: 4.4 g/dL (ref 3.5–5.0)
Alkaline Phosphatase: 68 U/L (ref 38–126)
Anion gap: 11 (ref 5–15)
BUN: 20 mg/dL (ref 8–23)
CO2: 25 mmol/L (ref 22–32)
Calcium: 9.5 mg/dL (ref 8.9–10.3)
Chloride: 107 mmol/L (ref 98–111)
Creatinine: 1.36 mg/dL — ABNORMAL HIGH (ref 0.61–1.24)
GFR, Estimated: 51 mL/min — ABNORMAL LOW (ref >60)
Glucose, Bld: 88 mg/dL (ref 70–99)
Potassium: 4.5 mmol/L (ref 3.5–5.1)
Sodium: 143 mmol/L (ref 135–145)
Total Bilirubin: 0.5 mg/dL (ref 0.0–1.2)
Total Protein: 7 g/dL (ref 6.5–8.1)

## 2024-03-08 LAB — CBC WITH DIFFERENTIAL (CANCER CENTER ONLY)
Abs Immature Granulocytes: 0.04 10*3/uL (ref 0.00–0.07)
Basophils Absolute: 0.1 10*3/uL (ref 0.0–0.1)
Basophils Relative: 1 %
Eosinophils Absolute: 0.1 10*3/uL (ref 0.0–0.5)
Eosinophils Relative: 2 %
HCT: 40.4 % (ref 39.0–52.0)
Hemoglobin: 12.9 g/dL — ABNORMAL LOW (ref 13.0–17.0)
Immature Granulocytes: 1 %
Lymphocytes Relative: 20 %
Lymphs Abs: 1.6 10*3/uL (ref 0.7–4.0)
MCH: 30.6 pg (ref 26.0–34.0)
MCHC: 31.9 g/dL (ref 30.0–36.0)
MCV: 96 fL (ref 80.0–100.0)
Monocytes Absolute: 0.5 10*3/uL (ref 0.1–1.0)
Monocytes Relative: 7 %
Neutro Abs: 5.7 10*3/uL (ref 1.7–7.7)
Neutrophils Relative %: 69 %
Platelet Count: 214 10*3/uL (ref 150–400)
RBC: 4.21 MIL/uL — ABNORMAL LOW (ref 4.22–5.81)
RDW: 12.8 % (ref 11.5–15.5)
WBC Count: 8.1 10*3/uL (ref 4.0–10.5)
nRBC: 0 % (ref 0.0–0.2)

## 2024-03-08 MED ORDER — SODIUM CHLORIDE 0.9 % IV SOLN
200.0000 mg | Freq: Once | INTRAVENOUS | Status: AC
  Administered 2024-03-08: 200 mg via INTRAVENOUS
  Filled 2024-03-08: qty 8

## 2024-03-08 MED ORDER — SODIUM CHLORIDE 0.9 % IV SOLN: INTRAVENOUS | Status: DC

## 2024-03-08 MED ORDER — CARBIDOPA-LEVODOPA 25-100 MG PO TABS: ORAL_TABLET | ORAL | 1 refills | Status: AC

## 2024-03-08 NOTE — Progress Notes (Signed)
 Patient seen by Lonna Cobb NP today  Vitals are within treatment parameters:Yes   Labs are within treatment parameters: Yes   Treatment plan has been signed: Yes   Per physician team, Patient is ready for treatment and there are NO modifications to the treatment plan.

## 2024-03-08 NOTE — Patient Instructions (Signed)
 [facility] - A DEPT OF [facility]  Discharge Instructions: Thank you for choosing [facility] to provide your oncology and hematology care.   If you have a lab appointment with the Cancer Center, please go directly to the Cancer Center and check in at the registration area.   Wear comfortable clothing and clothing appropriate for easy access to any Portacath or PICC line.   We strive to give you quality time with your provider. You may need to reschedule your appointment if you arrive late (15 or more minutes).  Arriving late affects you and other patients whose appointments are after yours.  Also, if you miss three or more appointments without notifying the office, you may be dismissed from the clinic at the provider's discretion.      For prescription refill requests, have your pharmacy contact our office and allow 72 hours for refills to be completed.    Today you received the following chemotherapy and/or immunotherapy agents KEYTRUDA      To help prevent nausea and vomiting after your treatment, we encourage you to take your nausea medication as directed.  BELOW ARE SYMPTOMS THAT SHOULD BE REPORTED IMMEDIATELY: *FEVER GREATER THAN 100.4 F (38 C) OR HIGHER *CHILLS OR SWEATING *NAUSEA AND VOMITING THAT IS NOT CONTROLLED WITH YOUR NAUSEA MEDICATION *UNUSUAL SHORTNESS OF BREATH *UNUSUAL BRUISING OR BLEEDING *URINARY PROBLEMS (pain or burning when urinating, or frequent urination) *BOWEL PROBLEMS (unusual diarrhea, constipation, pain near the anus) TENDERNESS IN MOUTH AND THROAT WITH OR WITHOUT PRESENCE OF ULCERS (sore throat, sores in mouth, or a toothache) UNUSUAL RASH, SWELLING OR PAIN  UNUSUAL VAGINAL DISCHARGE OR ITCHING   Items with * indicate a potential emergency and should be followed up as soon as possible or go to the Emergency Department if any problems should occur.  Please show the CHEMOTHERAPY ALERT CARD or IMMUNOTHERAPY  ALERT CARD at check-in to the Emergency Department and triage nurse.  Should you have questions after your visit or need to cancel or reschedule your appointment, please contact [facility] - A DEPT OF [facility]  Dept: [phone]  and follow the prompts.  Office hours are 8:00 a.m. to 4:30 p.m. Monday - Friday. Please note that voicemails left after 4:00 p.m. may not be returned until the following business day.  We are closed weekends and major holidays. You have access to a nurse at all times for urgent questions. Please call the main number to the clinic Dept: [phone] and follow the prompts.   For any non-urgent questions, you may also contact your provider using MyChart. We now offer e-Visits for anyone 38 and older to request care online for non-urgent symptoms. For details visit [email or web address].   Also download the MyChart app! Go to the app store, search "MyChart", open the app, select [place], and log in with your MyChart username and password.  Pembrolizumab Injection What is this medication? PEMBROLIZUMAB (PEM broe LIZ ue mab) treats some types of cancer. It works by helping your immune system slow or stop the spread of cancer cells. It is a monoclonal antibody. This medicine may be used for other purposes; ask your health care provider or pharmacist if you have questions. COMMON BRAND NAME(S): Keytruda What should I tell my care team before I take this medication? They need to know if you have any of these conditions: Allogeneic stem cell transplant (uses someone else's stem cells) Autoimmune diseases, such as  Crohn disease, ulcerative colitis, lupus History of chest radiation Nervous system problems, such as Guillain-Barre syndrome, myasthenia gravis Organ transplant An unusual or allergic reaction to pembrolizumab, other medications, foods, dyes, or preservatives Pregnant or trying to get pregnant Breast-feeding How should I  use this medication? This medication is injected into a vein. It is given by your care team in a hospital or clinic setting. A special MedGuide will be given to you before each treatment. Be sure to read this information carefully each time. Talk to your care team about the use of this medication in children. While it may be prescribed for children as young as 6 months for selected conditions, precautions do apply. Overdosage: If you think you have taken too much of this medicine contact a poison control center or emergency room at once. NOTE: This medicine is only for you. Do not share this medicine with others. What if I miss a dose? Keep appointments for follow-up doses. It is important not to miss your dose. Call your care team if you are unable to keep an appointment. What may interact with this medication? Interactions have not been studied. This list may not describe all possible interactions. Give your health care provider a list of all the medicines, herbs, non-prescription drugs, or dietary supplements you use. Also tell them if you smoke, drink alcohol, or use illegal drugs. Some items may interact with your medicine. What should I watch for while using this medication? Your condition will be monitored carefully while you are receiving this medication. You may need blood work while taking this medication. This medication may cause serious skin reactions. They can happen weeks to months after starting the medication. Contact your care team right away if you notice fevers or flu-like symptoms with a rash. The rash may be red or purple and then turn into blisters or peeling of the skin. You may also notice a red rash with swelling of the face, lips, or lymph nodes in your neck or under your arms. Tell your care team right away if you have any change in your eyesight. Talk to your care team if you may be pregnant. Serious birth defects can occur if you take this medication during pregnancy and  for 4 months after the last dose. You will need a negative pregnancy test before starting this medication. Contraception is recommended while taking this medication and for 4 months after the last dose. Your care team can help you find the option that works for you. Do not breastfeed while taking this medication and for 4 months after the last dose. What side effects may I notice from receiving this medication? Side effects that you should report to your care team as soon as possible: Allergic reactions--skin rash, itching, hives, swelling of the face, lips, tongue, or throat Dry cough, shortness of breath or trouble breathing Eye pain, redness, irritation, or discharge with blurry or decreased vision Heart muscle inflammation--unusual weakness or fatigue, shortness of breath, chest pain, fast or irregular heartbeat, dizziness, swelling of the ankles, feet, or hands Hormone gland problems--headache, sensitivity to light, unusual weakness or fatigue, dizziness, fast or irregular heartbeat, increased sensitivity to cold or heat, excessive sweating, constipation, hair loss, increased thirst or amount of urine, tremors or shaking, irritability Infusion reactions--chest pain, shortness of breath or trouble breathing, feeling faint or lightheaded Kidney injury (glomerulonephritis)--decrease in the amount of urine, red or dark brown urine, foamy or bubbly urine, swelling of the ankles, hands, or feet Liver injury--right upper belly  pain, loss of appetite, nausea, light-colored stool, dark yellow or brown urine, yellowing skin or eyes, unusual weakness or fatigue Pain, tingling, or numbness in the hands or feet, muscle weakness, change in vision, confusion or trouble speaking, loss of balance or coordination, trouble walking, seizures Rash, fever, and swollen lymph nodes Redness, blistering, peeling, or loosening of the skin, including inside the mouth Sudden or severe stomach pain, bloody diarrhea, fever,  nausea, vomiting Side effects that usually do not require medical attention (report to your care team if they continue or are bothersome): Bone, joint, or muscle pain Diarrhea Fatigue Loss of appetite Nausea Skin rash This list may not describe all possible side effects. Call your doctor for medical advice about side effects. You may report side effects to FDA at 1-800-FDA-1088. Where should I keep my medication? This medication is given in a hospital or clinic. It will not be stored at home. NOTE: This sheet is a summary. It may not cover all possible information. If you have questions about this medicine, talk to your doctor, pharmacist, or health care provider.  2024 Elsevier/Gold Standard (2022-01-26 00:00:00)

## 2024-03-08 NOTE — Progress Notes (Signed)
  [facility] OFFICE PROGRESS NOTE   Diagnosis: Bladder cancer  INTERVAL HISTORY:   [name] returns as scheduled.  He completed another cycle of Pembrolizumab  02/17/2024.  No rash or diarrhea.  No nausea or vomiting.  Stable appetite.    Objective:  Vital signs in last 24 hours:  Blood pressure 131/75, pulse 80, temperature 98.2 F (36.8 C), temperature source Temporal, resp. rate 18, height 5' 9 (1.753 m), weight 144 lb 4.8 oz (65.5 kg), SpO2 100%.    HEENT: No thrush or ulcers. Resp: Lungs clear bilaterally. Cardio: Regular rate and rhythm. GI: No hepatosplenomegaly. Vascular: No leg edema. Neuro: Left hand/arm tremor. Skin: No rash.   Lab Results:  Lab Results  Component Value Date   WBC 8.1 03/08/2024   HGB 12.9 (L) 03/08/2024   HCT 40.4 03/08/2024   MCV 96.0 03/08/2024   PLT 214 03/08/2024   NEUTROABS 5.7 03/08/2024    Imaging:  No results found.  Medications: I have reviewed the patient's current medications.  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  TURBT 07/18/2018.  Found to have high-grade invasive urothelial carcinoma; with suspicion for invasion into the muscularis propria, T2 lesion with grade 3 histology.  He completed radiation therapy with weekly carboplatin  09/04/2018 through 10/25/2018. Cystoscopy 06/09/2023-nodular lesion left anterior bladder wall, 2 other smaller lesions to the right CTs 07/03/2023-negative for metastatic disease Cystoscopy/TURBT 07/26/2023-scattered patches of sessile appearing tumors with associated dystrophic calcifications along the left anterior bladder wall and dome; additional small patches of sessile appearing tumor along the posterior bladder wall left greater than right and right anterior bladder wall; entirety of the left anterior sessile tumor was resected, remainder of the tumors were cauterized, pathology-high-grade invasive urothelial carcinoma, muscularis propria not  present, lymphovascular invasion not identified, tumor invades at least lamina propria.  PD-L1 CPS 3 Cycle 1 Pembrolizumab  08/29/2023 Cycle 2 Pembrolizumab  09/19/2023 Cycle 3 pembrolizumab  10/11/2023 Cycle 4 pembrolizumab  11/04/2023 Cystoscopy 11/10/2023-bladder with mild trabeculation with lesions.  On the dome of the bladder there is a lesion measuring approximately 4 cm in diameter with some necrotic debris, probably a persistent cancer.  No other lesions noted. Cycle 5 Pembrolizumab  11/25/2023 Cycle 6 pembrolizumab  12/16/2023 CTs 12/30/2023-no evidence of metastatic disease in the chest, abdomen, pelvis Cycle 7 Pembrolizumab  01/06/2024 Cycle 8 Pembrolizumab  01/27/2024 Cycle 9 pembrolizumab  02/17/2024 Cycle 10 Pembrolizumab  03/08/2024 Recent diagnosis of Parkinson's, followed by Dr. Winferd Hatter Hypothyroid  Disposition: [name] appears stable.  He has completed 9 cycles of Pembrolizumab .  He continues to tolerate well.  There is no clinical evidence of disease progression.  Plan to proceed with cycle 10 today as scheduled.  He has an appointment with Dr. Nolon Baxter later this month.  CBC and chemistry panel reviewed.  Labs adequate for treatment.  He will return for follow-up and Pembrolizumab  in 3 weeks.    Diana Forster ANP/GNP-BC   03/08/2024  12:13 PM

## 2024-03-08 NOTE — Patient Instructions (Signed)
 INCREASE carbidopa /levodopa  to 2 tablets at 7am/11am/4pm  SAVE THE DATE!  We are planning a Parkinsons Disease educational symposium at [facility] in [place] on September 19.  More details to come!  We will have a movement disorder physician expert from [employer] coming to speak and a caregiver speaker.  We will have a panel of experts that will show you who you may need on your team of people on your journey with Parkinsons.  If you would like to be added to our email list to get further information, email [email or web address].  I hope to see you there!

## 2024-03-09 ENCOUNTER — Other Ambulatory Visit: Payer: Self-pay

## 2024-03-25 ENCOUNTER — Other Ambulatory Visit: Payer: Self-pay | Admitting: Oncology

## 2024-03-29 ENCOUNTER — Inpatient Hospital Stay: Admitting: Oncology

## 2024-03-29 ENCOUNTER — Inpatient Hospital Stay: Attending: Nurse Practitioner

## 2024-03-29 ENCOUNTER — Inpatient Hospital Stay

## 2024-03-29 ENCOUNTER — Other Ambulatory Visit

## 2024-03-29 VITALS — BP 112/73 | HR 85 | Temp 97.8°F | Resp 18 | Ht 69.0 in | Wt 145.2 lb

## 2024-03-29 DIAGNOSIS — Z923 Personal history of irradiation: Secondary | ICD-10-CM | POA: Insufficient documentation

## 2024-03-29 DIAGNOSIS — Z5112 Encounter for antineoplastic immunotherapy: Secondary | ICD-10-CM | POA: Diagnosis present

## 2024-03-29 DIAGNOSIS — R350 Frequency of micturition: Secondary | ICD-10-CM | POA: Diagnosis not present

## 2024-03-29 DIAGNOSIS — G20A1 Parkinson's disease without dyskinesia, without mention of fluctuations: Secondary | ICD-10-CM | POA: Diagnosis not present

## 2024-03-29 DIAGNOSIS — C679 Malignant neoplasm of bladder, unspecified: Secondary | ICD-10-CM | POA: Diagnosis not present

## 2024-03-29 DIAGNOSIS — E039 Hypothyroidism, unspecified: Secondary | ICD-10-CM | POA: Diagnosis not present

## 2024-03-29 LAB — CBC WITH DIFFERENTIAL (CANCER CENTER ONLY)
Abs Immature Granulocytes: 0.03 10*3/uL (ref 0.00–0.07)
Basophils Absolute: 0.1 10*3/uL (ref 0.0–0.1)
Basophils Relative: 1 %
Eosinophils Absolute: 0.2 10*3/uL (ref 0.0–0.5)
Eosinophils Relative: 3 %
HCT: 40.6 % (ref 39.0–52.0)
Hemoglobin: 13.2 g/dL (ref 13.0–17.0)
Immature Granulocytes: 1 %
Lymphocytes Relative: 20 %
Lymphs Abs: 1.3 10*3/uL (ref 0.7–4.0)
MCH: 30.6 pg (ref 26.0–34.0)
MCHC: 32.5 g/dL (ref 30.0–36.0)
MCV: 94 fL (ref 80.0–100.0)
Monocytes Absolute: 0.5 10*3/uL (ref 0.1–1.0)
Monocytes Relative: 8 %
Neutro Abs: 4.4 10*3/uL (ref 1.7–7.7)
Neutrophils Relative %: 67 %
Platelet Count: 225 10*3/uL (ref 150–400)
RBC: 4.32 MIL/uL (ref 4.22–5.81)
RDW: 12.4 % (ref 11.5–15.5)
WBC Count: 6.5 10*3/uL (ref 4.0–10.5)
nRBC: 0 % (ref 0.0–0.2)

## 2024-03-29 LAB — CMP (CANCER CENTER ONLY)
ALT: <5 U/L (ref 0–44)
AST: 17 U/L (ref 15–41)
Albumin: 4.2 g/dL (ref 3.5–5.0)
Alkaline Phosphatase: 65 U/L (ref 38–126)
Anion gap: 10 (ref 5–15)
BUN: 18 mg/dL (ref 8–23)
CO2: 24 mmol/L (ref 22–32)
Calcium: 9.6 mg/dL (ref 8.9–10.3)
Chloride: 108 mmol/L (ref 98–111)
Creatinine: 1.41 mg/dL — ABNORMAL HIGH (ref 0.61–1.24)
GFR, Estimated: 49 mL/min — ABNORMAL LOW (ref >60)
Glucose, Bld: 107 mg/dL — ABNORMAL HIGH (ref 70–99)
Potassium: 4.3 mmol/L (ref 3.5–5.1)
Sodium: 143 mmol/L (ref 135–145)
Total Bilirubin: 0.6 mg/dL (ref 0.0–1.2)
Total Protein: 7 g/dL (ref 6.5–8.1)

## 2024-03-29 MED ORDER — SODIUM CHLORIDE 0.9 % IV SOLN: INTRAVENOUS | Status: DC

## 2024-03-29 MED ORDER — SODIUM CHLORIDE 0.9 % IV SOLN
200.0000 mg | Freq: Once | INTRAVENOUS | Status: AC
  Administered 2024-03-29: 200 mg via INTRAVENOUS
  Filled 2024-03-29: qty 8

## 2024-03-29 NOTE — Patient Instructions (Signed)
 [facility] - A DEPT OF [facility]  Discharge Instructions: Thank you for choosing [facility] to provide your oncology and hematology care.   If you have a lab appointment with the Cancer Center, please go directly to the Cancer Center and check in at the registration area.   Wear comfortable clothing and clothing appropriate for easy access to any Portacath or PICC line.   We strive to give you quality time with your provider. You may need to reschedule your appointment if you arrive late (15 or more minutes).  Arriving late affects you and other patients whose appointments are after yours.  Also, if you miss three or more appointments without notifying the office, you may be dismissed from the clinic at the provider's discretion.      For prescription refill requests, have your pharmacy contact our office and allow 72 hours for refills to be completed.    Today you received the following chemotherapy and/or immunotherapy agents Keytruda      To help prevent nausea and vomiting after your treatment, we encourage you to take your nausea medication as directed.  BELOW ARE SYMPTOMS THAT SHOULD BE REPORTED IMMEDIATELY: *FEVER GREATER THAN 100.4 F (38 C) OR HIGHER *CHILLS OR SWEATING *NAUSEA AND VOMITING THAT IS NOT CONTROLLED WITH YOUR NAUSEA MEDICATION *UNUSUAL SHORTNESS OF BREATH *UNUSUAL BRUISING OR BLEEDING *URINARY PROBLEMS (pain or burning when urinating, or frequent urination) *BOWEL PROBLEMS (unusual diarrhea, constipation, pain near the anus) TENDERNESS IN MOUTH AND THROAT WITH OR WITHOUT PRESENCE OF ULCERS (sore throat, sores in mouth, or a toothache) UNUSUAL RASH, SWELLING OR PAIN  UNUSUAL VAGINAL DISCHARGE OR ITCHING   Items with * indicate a potential emergency and should be followed up as soon as possible or go to the Emergency Department if any problems should occur.  Please show the CHEMOTHERAPY ALERT CARD or IMMUNOTHERAPY  ALERT CARD at check-in to the Emergency Department and triage nurse.  Should you have questions after your visit or need to cancel or reschedule your appointment, please contact [facility] DEPT [facility]  Dept: [phone]  and follow the prompts.  Office hours are 8:00 a.m. to 4:30 p.m. Monday - Friday. Please note that voicemails left after 4:00 p.m. may not be returned until the following business day.  We are closed weekends and major holidays. You have access to a nurse at all times for urgent questions. Please call the main number to the clinic Dept: [phone] and follow the prompts.   For any non-urgent questions, you may also contact your provider using MyChart. We now offer e-Visits for anyone 6 and older to request care online for non-urgent symptoms. For details visit [email or web address].   Also download the MyChart app! Go to the app store, search "MyChart", open the app, select [place], and log in with your MyChart username and password.

## 2024-03-29 NOTE — Progress Notes (Signed)
 Patient seen by Dr. Thornton Papas today  Vitals are within treatment parameters:Yes   Labs are within treatment parameters: Yes   Treatment plan has been signed: Yes   Per physician team, Patient is ready for treatment and there are NO modifications to the treatment plan.

## 2024-03-29 NOTE — Progress Notes (Signed)
 [facility] OFFICE PROGRESS NOTE   Diagnosis: Bladder cancer  INTERVAL HISTORY:   Anthony Pacheco returns as scheduled.  He completed another treatment with pembrolizumab  on 03/08/2024.  No rash or diarrhea.  No hematuria.  No new complaint. He saw Dr. Nicholaus on 03/15/2024.  A cystoscopy revealed a lesion at the bladder dome which appeared less prominent than on the previous cystoscopy. Anthony. Anthony Pacheco continues to have urinary frequency.  Objective:  Vital signs in last 24 hours:  Blood pressure 112/73, pulse 85, temperature 97.8 F (36.6 C), resp. rate 18, height 5' 9 (1.753 m), weight 145 lb 3.2 oz (65.9 kg), SpO2 98%.  Resp: Lungs clear bilaterally Cardio: Regular rate and rhythm GI: No hepatosplenomegaly, no mass, nontender Vascular: No leg edema  Skin: No rash  Lab Results:  Lab Results  Component Value Date   WBC 6.5 03/29/2024   HGB 13.2 03/29/2024   HCT 40.6 03/29/2024   MCV 94.0 03/29/2024   PLT 225 03/29/2024   NEUTROABS 4.4 03/29/2024    CMP  Lab Results  Component Value Date   NA 143 03/29/2024   K 4.3 03/29/2024   CL 108 03/29/2024   CO2 24 03/29/2024   GLUCOSE 107 (H) 03/29/2024   BUN 18 03/29/2024   CREATININE 1.41 (H) 03/29/2024   CALCIUM 9.6 03/29/2024   PROT 7.0 03/29/2024   ALBUMIN 4.2 03/29/2024   AST 17 03/29/2024   ALT <5 03/29/2024   ALKPHOS 65 03/29/2024   BILITOT 0.6 03/29/2024   GFRNONAA 49 (L) 03/29/2024   GFRAA 52 (L) 05/20/2020    No results found for: CEA1, CEA, CAN199, CA125  No results found for: INR, LABPROT  Imaging:  No results found.  Medications: I have reviewed the patient's current medications.   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  TURBT 07/18/2018.  Found to have high-grade invasive urothelial carcinoma; with suspicion for invasion into the muscularis propria, T2 lesion with grade 3 histology.  He completed radiation therapy with weekly carboplatin  09/04/2018  through 10/25/2018. Cystoscopy 06/09/2023-nodular lesion left anterior bladder wall, 2 other smaller lesions to the right CTs 07/03/2023-negative for metastatic disease Cystoscopy/TURBT 07/26/2023-scattered patches of sessile appearing tumors with associated dystrophic calcifications along the left anterior bladder wall and dome; additional small patches of sessile appearing tumor along the posterior bladder wall left greater than right and right anterior bladder wall; entirety of the left anterior sessile tumor was resected, remainder of the tumors were cauterized, pathology-high-grade invasive urothelial carcinoma, muscularis propria not present, lymphovascular invasion not identified, tumor invades at least lamina propria.  PD-L1 CPS 3 Cycle 1 Pembrolizumab  08/29/2023 Cycle 2 Pembrolizumab  09/19/2023 Cycle 3 pembrolizumab  10/11/2023 Cycle 4 pembrolizumab  11/04/2023 Cystoscopy 11/10/2023-bladder with mild trabeculation with lesions.  On the dome of the bladder there is a lesion measuring approximately 4 cm in diameter with some necrotic debris, probably a persistent cancer.  No other lesions noted. Cycle 5 Pembrolizumab  11/25/2023 Cycle 6 pembrolizumab  12/16/2023 CTs 12/30/2023-no evidence of metastatic disease in the chest, abdomen, pelvis Cycle 7 Pembrolizumab  01/06/2024 Cycle 8 Pembrolizumab  01/27/2024 Cycle 9 pembrolizumab  02/17/2024 Cycle 10 Pembrolizumab  03/08/2024 Cystoscopy 03/15/2024 papillary lesion at the bladder dome-less prominent Cycle 11 pembrolizumab  03/29/2024 Recent diagnosis of Parkinson's, followed by Dr. Evonnie Hypothyroid    Disposition: Anthony Class appears unchanged.  He continues to tolerate the pembrolizumab  well.  A restaging cystoscopy by Dr. Nicholaus on 03/15/2024 revealed improvement in the bladder dome tumor.  The plan is to continue pembrolizumab .  He  will undergo a repeat cystoscopy and possible TURBT in 3 months.  Anthony. Anthony Pacheco will complete another treatment with pembrolizumab  today.   He will return for an office visit and pembrolizumab  in 3 weeks.  Arley Hof, MD  03/29/2024  11:02 AM

## 2024-04-19 ENCOUNTER — Inpatient Hospital Stay: Admitting: Oncology

## 2024-04-19 ENCOUNTER — Inpatient Hospital Stay

## 2024-04-19 ENCOUNTER — Other Ambulatory Visit

## 2024-04-19 ENCOUNTER — Ambulatory Visit

## 2024-04-19 ENCOUNTER — Other Ambulatory Visit: Payer: Self-pay | Admitting: *Deleted

## 2024-04-19 VITALS — BP 137/73 | HR 80 | Temp 98.2°F | Resp 18 | Ht 69.0 in | Wt 144.3 lb

## 2024-04-19 DIAGNOSIS — C679 Malignant neoplasm of bladder, unspecified: Secondary | ICD-10-CM | POA: Diagnosis not present

## 2024-04-19 DIAGNOSIS — Z5112 Encounter for antineoplastic immunotherapy: Secondary | ICD-10-CM | POA: Diagnosis not present

## 2024-04-19 LAB — CMP (CANCER CENTER ONLY)
ALT: <5 U/L (ref 0–44)
AST: 18 U/L (ref 15–41)
Albumin: 4.4 g/dL (ref 3.5–5.0)
Alkaline Phosphatase: 65 U/L (ref 38–126)
Anion gap: 11 (ref 5–15)
BUN: 17 mg/dL (ref 8–23)
CO2: 25 mmol/L (ref 22–32)
Calcium: 9.7 mg/dL (ref 8.9–10.3)
Chloride: 106 mmol/L (ref 98–111)
Creatinine: 1.44 mg/dL — ABNORMAL HIGH (ref 0.61–1.24)
GFR, Estimated: 48 mL/min — ABNORMAL LOW (ref >60)
Glucose, Bld: 110 mg/dL — ABNORMAL HIGH (ref 70–99)
Potassium: 4.3 mmol/L (ref 3.5–5.1)
Sodium: 142 mmol/L (ref 135–145)
Total Bilirubin: 0.6 mg/dL (ref 0.0–1.2)
Total Protein: 7.3 g/dL (ref 6.5–8.1)

## 2024-04-19 LAB — CBC WITH DIFFERENTIAL (CANCER CENTER ONLY)
Abs Immature Granulocytes: 0.03 K/uL (ref 0.00–0.07)
Basophils Absolute: 0.1 K/uL (ref 0.0–0.1)
Basophils Relative: 1 %
Eosinophils Absolute: 0.1 K/uL (ref 0.0–0.5)
Eosinophils Relative: 2 %
HCT: 41.6 % (ref 39.0–52.0)
Hemoglobin: 13.3 g/dL (ref 13.0–17.0)
Immature Granulocytes: 0 %
Lymphocytes Relative: 19 %
Lymphs Abs: 1.4 K/uL (ref 0.7–4.0)
MCH: 30.6 pg (ref 26.0–34.0)
MCHC: 32 g/dL (ref 30.0–36.0)
MCV: 95.6 fL (ref 80.0–100.0)
Monocytes Absolute: 0.5 K/uL (ref 0.1–1.0)
Monocytes Relative: 6 %
Neutro Abs: 5.5 K/uL (ref 1.7–7.7)
Neutrophils Relative %: 72 %
Platelet Count: 227 K/uL (ref 150–400)
RBC: 4.35 MIL/uL (ref 4.22–5.81)
RDW: 12.6 % (ref 11.5–15.5)
WBC Count: 7.6 K/uL (ref 4.0–10.5)
nRBC: 0 % (ref 0.0–0.2)

## 2024-04-19 LAB — TSH: TSH: 0.694 u[IU]/mL (ref 0.350–4.500)

## 2024-04-19 NOTE — Progress Notes (Signed)
 [facility] OFFICE PROGRESS NOTE   Diagnosis: Bladder cancer  INTERVAL HISTORY:   Anthony Pacheco completed another treatment with pembrolizumab  on 03/29/2024.  No rash or diarrhea.  No hematuria.  He has noted skin lesions at the right arm and lower legs for the past 3 to 4 days.  Objective:  Vital signs in last 24 hours:  Blood pressure 137/73, pulse 80, temperature 98.2 F (36.8 C), temperature source Temporal, resp. rate 18, height 5' 9 (1.753 m), weight 144 lb 4.8 oz (65.5 kg), SpO2 100%.    Resp: Lungs clear bilaterally Cardio: Regular rhythm with premature beats GI: No hepatosplenomegaly Vascular: No leg edema  Skin: Multiple round/oval plaque lesions over the lower legs and dorsum of the right arm.  Several lesions are developing central ulceration.  No vesicles.  Lab Results:  Lab Results  Component Value Date   WBC 7.6 04/19/2024   HGB 13.3 04/19/2024   HCT 41.6 04/19/2024   MCV 95.6 04/19/2024   PLT 227 04/19/2024   NEUTROABS 5.5 04/19/2024    CMP  Lab Results  Component Value Date   NA 142 04/19/2024   K 4.3 04/19/2024   CL 106 04/19/2024   CO2 25 04/19/2024   GLUCOSE 110 (H) 04/19/2024   BUN 17 04/19/2024   CREATININE 1.44 (H) 04/19/2024   CALCIUM 9.7 04/19/2024   PROT 7.3 04/19/2024   ALBUMIN 4.4 04/19/2024   AST 18 04/19/2024   ALT <5 04/19/2024   ALKPHOS 65 04/19/2024   BILITOT 0.6 04/19/2024   GFRNONAA 48 (L) 04/19/2024   GFRAA 52 (L) 05/20/2020    No results found for: CEA1, CEA, CAN199, CA125  No results found for: INR, LABPROT  Imaging:  No results found.  Medications: I have reviewed the patient's current medications.   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  TURBT 07/18/2018.  Found to have high-grade invasive urothelial carcinoma; with suspicion for invasion into the muscularis propria, T2 lesion with grade 3 histology.  He completed radiation therapy with weekly carboplatin   09/04/2018 through 10/25/2018. Cystoscopy 06/09/2023-nodular lesion left anterior bladder wall, 2 other smaller lesions to the right CTs 07/03/2023-negative for metastatic disease Cystoscopy/TURBT 07/26/2023-scattered patches of sessile appearing tumors with associated dystrophic calcifications along the left anterior bladder wall and dome; additional small patches of sessile appearing tumor along the posterior bladder wall left greater than right and right anterior bladder wall; entirety of the left anterior sessile tumor was resected, remainder of the tumors were cauterized, pathology-high-grade invasive urothelial carcinoma, muscularis propria not present, lymphovascular invasion not identified, tumor invades at least lamina propria.  PD-L1 CPS 3 Cycle 1 Pembrolizumab  08/29/2023 Cycle 2 Pembrolizumab  09/19/2023 Cycle 3 pembrolizumab  10/11/2023 Cycle 4 pembrolizumab  11/04/2023 Cystoscopy 11/10/2023-bladder with mild trabeculation with lesions.  On the dome of the bladder there is a lesion measuring approximately 4 cm in diameter with some necrotic debris, probably a persistent cancer.  No other lesions noted. Cycle 5 Pembrolizumab  11/25/2023 Cycle 6 pembrolizumab  12/16/2023 CTs 12/30/2023-no evidence of metastatic disease in the chest, abdomen, pelvis Cycle 7 Pembrolizumab  01/06/2024 Cycle 8 Pembrolizumab  01/27/2024 Cycle 9 pembrolizumab  02/17/2024 Cycle 10 Pembrolizumab  03/08/2024 Cystoscopy 03/15/2024 papillary lesion at the bladder dome-less prominent Cycle 11 pembrolizumab  03/29/2024 Recent diagnosis of Parkinson's, followed by Dr. Evonnie Hypothyroid Multiple ulcerated/plaque lesions on exam 04/20/2023      Disposition: Anthony Pacheco is in clinical remission from bladder cancer.  He has been maintained on adjuvant pembrolizumab  since December 2024.  He has developed raised  lesions over the extremities.  Some are ulcerated.  I am concerned the skin lesions are related to toxicity from immunotherapy.  We will  make an urgent dermatology referral to request a biopsy and treatment recommendations.  He will return for an office visit in 3 weeks.  Arley Hof, MD  04/19/2024  11:31 AM

## 2024-04-19 NOTE — Progress Notes (Signed)
 Placed urgent referral for dermatology and sent message to referral coordinator as well.

## 2024-04-20 LAB — T4: T4, Total: 7.8 ug/dL (ref 4.5–12.0)

## 2024-04-26 ENCOUNTER — Encounter: Payer: Self-pay | Admitting: Dermatology

## 2024-04-26 ENCOUNTER — Ambulatory Visit: Admitting: Dermatology

## 2024-04-26 DIAGNOSIS — R21 Rash and other nonspecific skin eruption: Secondary | ICD-10-CM | POA: Diagnosis not present

## 2024-04-26 DIAGNOSIS — C679 Malignant neoplasm of bladder, unspecified: Secondary | ICD-10-CM

## 2024-04-26 DIAGNOSIS — L409 Psoriasis, unspecified: Secondary | ICD-10-CM | POA: Diagnosis not present

## 2024-04-26 MED ORDER — CLOBETASOL PROPIONATE 0.05 % EX OINT
1.0000 | TOPICAL_OINTMENT | Freq: Two times a day (BID) | CUTANEOUS | 0 refills | Status: DC
Start: 2024-04-26 — End: 2024-08-07

## 2024-04-26 MED ORDER — MUPIROCIN 2 % EX OINT: 1.0000 | TOPICAL_OINTMENT | Freq: Two times a day (BID) | CUTANEOUS | 0 refills | Status: AC

## 2024-04-26 NOTE — Progress Notes (Signed)
   New Patient Visit   Subjective  Anthony Pacheco is a 88 y.o. male who presents for the following: Skin Lesions  Anthony Pacheco is here today due to skin lesions on his legs. He is currently on immunotherapy for bladder cancer. He started his treatment December of 2024 and these skin lesions appeared about 2-3 weeks ago. Oncology referred [name] to us  to evaluate. Denies bleeding or draining. Sometimes is itchy. No hx of skin cancer.    The following portions of the chart were reviewed this encounter and updated as appropriate: medications, allergies, medical history  Review of Systems:  No other skin or systemic complaints except as noted in HPI or Assessment and Plan.  Objective  Well appearing patient in no apparent distress; mood and affect are within normal limits.  A full examination was performed including scalp, head, eyes, ears, nose, lips, neck, chest, axillae, abdomen, back, buttocks, bilateral upper extremities, bilateral lower extremities, hands, feet, fingers, toes, fingernails, and toenails. All findings within normal limits unless otherwise noted below.    Relevant exam findings are noted in the Assessment and Plan.    Assessment & Plan   1. Cutaneous Eruption - Assessment: Patient presents with well-circumscribed erythematous crusty plaques on bilateral upper and lower extremities, sparing the trunk. Some lesions appear as small pustules with yellow crust, while others are scaly. The rash is asymptomatic, with no reported pruritus. Onset was approximately 3 weeks ago, occurring 7 months into pembrolizumab  treatment for bladder cancer. Differential diagnoses include acute generalized exanthematous pustulosis (AGEP), drug reaction, or psoriasis. A punch biopsy is planned to determine the etiology of the rash and its potential relation to pembrolizumab  therapy.  - Plan:    Perform 4 mm punch biopsy of a representative lesion    Topical treatment:     - Apply combination of  clobetasol  ointment and mupirocin  ointment to all lesions twice daily for 2 weeks    Follow-up appointment in 2 weeks to review biopsy results and assess response to treatment  2. Bladder Cancer - Assessment: Patient has been undergoing treatment for bladder cancer since December, currently receiving pembrolizumab  immunotherapy infusions. The cutaneous eruption may be a potential adverse effect of this treatment.  - Plan:    Continue current pembrolizumab  infusion regimen as prescribed by oncology    Coordinate care with oncology team regarding potential immunotherapy-related adverse event  Follow-up in 2 weeks for biopsy results review and treatment response assessment.    No follow-ups on file.  I, Gordan Beams, CMA, am acting as scribe for [employer], DO.   Documentation: I have reviewed the above documentation for accuracy and completeness, and I agree with the above.  Delon Lenis, DO

## 2024-04-26 NOTE — Patient Instructions (Signed)
 Date: Thu Apr 26 2024  Hello Anthony Pacheco,  Thank you for visiting today. Here is a summary of the key instructions:  - Medications: Apply a mixture of clobetasol  ointment and mupirocin  ointment to all skin lesions twice a day for 2 weeks  - Procedures: A punch biopsy was performed on one of the skin lesions.  See wound care instructions below  - Follow-up: Return for a follow-up appointment in 2 weeks to review the biopsy results and remove the sutures  Please reach out if you have any questions or concerns.  Best Regards,  Dr. Delon Lenis, Dermatology   Patient Handout: Wound Care for Skin Biopsy Site  Taking Care of Your Skin Biopsy Site  Proper care of the biopsy site is essential for promoting healing and minimizing scarring. This handout provides instructions on how to care for your biopsy site to ensure optimal recovery.  1. Cleaning the Wound:  Clean the biopsy site daily with gentle soap and water. Gently pat the area dry with a clean, soft towel. Avoid harsh scrubbing or rubbing the area, as this can irritate the skin and delay healing.  2. Applying Aquaphor and Bandage:  After cleaning the wound, apply a thin layer of Aquaphor ointment to the biopsy site. Cover the area with a sterile bandage to protect it from dirt, bacteria, and friction. Change the bandage daily or as needed if it becomes soiled or wet.  3. Continued Care for One Week:  Repeat the cleaning, Aquaphor application, and bandaging process daily for one week following the biopsy procedure. Keeping the wound clean and moist during this initial healing period will help prevent infection and promote optimal healing.  4. Massaging Aquaphor into the Area:  ---After one week, discontinue the use of bandages but continue to apply Aquaphor to the biopsy site. ----Gently massage the Aquaphor into the area using circular motions. ---Massaging the skin helps to promote circulation and prevent the  formation of scar tissue.   Additional Tips:  Avoid exposing the biopsy site to direct sunlight during the healing process, as this can cause hyperpigmentation or worsen scarring. If you experience any signs of infection, such as increased redness, swelling, warmth, or drainage from the wound, contact your healthcare provider immediately. Follow any additional instructions provided by your healthcare provider for caring for the biopsy site and managing any discomfort. Conclusion:  Taking proper care of your skin biopsy site is crucial for ensuring optimal healing and minimizing scarring. By following these instructions for cleaning, applying Aquaphor, and massaging the area, you can promote a smooth and successful recovery. If you have any questions or concerns about caring for your biopsy site, don't hesitate to contact your healthcare provider for guidance.     Important Information  Due to recent changes in healthcare laws, you may see results of your pathology and/or laboratory studies on MyChart before the doctors have had a chance to review them. We understand that in some cases there may be results that are confusing or concerning to you. Please understand that not all results are received at the same time and often the doctors may need to interpret multiple results in order to provide you with the best plan of care or course of treatment. Therefore, we ask that you please give us  2 business days to thoroughly review all your results before contacting the office for clarification. Should we see a critical lab result, you will be contacted sooner.   If You Need Anything After Your Visit  If you have any questions or concerns for your doctor, please call our main line at [phone] If no one answers, please leave a voicemail as directed and we will return your call as soon as possible. Messages left after 4 pm will be answered the following business day.   You may also send us  a message  via MyChart. We typically respond to MyChart messages within 1-2 business days.  For prescription refills, please ask your pharmacy to contact our office. Our fax number is [phone].  If you have an urgent issue when the clinic is closed that cannot wait until the next business day, you can page your doctor at the number below.    Please note that while we do our best to be available for urgent issues outside of office hours, we are not available 24/7.   If you have an urgent issue and are unable to reach us , you may choose to seek medical care at your doctor's office, retail clinic, urgent care center, or emergency room.  If you have a medical emergency, please immediately call 911 or go to the emergency department. In the event of inclement weather, please call our main line at [phone] for an update on the status of any delays or closures.  Dermatology Medication Tips: Please keep the boxes that topical medications come in in order to help keep track of the instructions about where and how to use these. Pharmacies typically print the medication instructions only on the boxes and not directly on the medication tubes.   If your medication is too expensive, please contact our office at [phone] or send us  a message through MyChart.   We are unable to tell what your co-pay for medications will be in advance as this is different depending on your insurance coverage. However, we may be able to find a substitute medication at lower cost or fill out paperwork to get insurance to cover a needed medication.   If a prior authorization is required to get your medication covered by your insurance company, please allow us  1-2 business days to complete this process.  Drug prices often vary depending on where the prescription is filled and some pharmacies may offer cheaper prices.  The website www.goodrx.com contains coupons for medications through different pharmacies. The prices here do not  account for what the cost may be with help from insurance (it may be cheaper with your insurance), but the website can give you the price if you did not use any insurance.  - You can print the associated coupon and take it with your prescription to the pharmacy.  - You may also stop by our office during regular business hours and pick up a GoodRx coupon card.  - If you need your prescription sent electronically to a different pharmacy, notify our office through [facility] or by phone at [phone]

## 2024-05-01 LAB — SURGICAL PATHOLOGY

## 2024-05-02 ENCOUNTER — Ambulatory Visit: Payer: Self-pay | Admitting: Dermatology

## 2024-05-02 NOTE — Progress Notes (Signed)
 Please call pt and inform him that the bx confirms a dx of psoriasis.  He should continue applying the clobetasol  ointment BID, 2 weeks on, 2 weeks off for the next 3 months and then return for a follow  up appointment.  - Dr Alm

## 2024-05-10 ENCOUNTER — Inpatient Hospital Stay: Attending: Nurse Practitioner

## 2024-05-10 ENCOUNTER — Inpatient Hospital Stay: Admitting: Nurse Practitioner

## 2024-05-10 ENCOUNTER — Encounter: Payer: Self-pay | Admitting: Nurse Practitioner

## 2024-05-10 ENCOUNTER — Other Ambulatory Visit

## 2024-05-10 ENCOUNTER — Other Ambulatory Visit: Payer: Self-pay | Admitting: *Deleted

## 2024-05-10 ENCOUNTER — Inpatient Hospital Stay

## 2024-05-10 VITALS — BP 134/68 | HR 79 | Temp 98.2°F | Resp 16 | Ht 69.0 in | Wt 144.8 lb

## 2024-05-10 DIAGNOSIS — C679 Malignant neoplasm of bladder, unspecified: Secondary | ICD-10-CM

## 2024-05-10 DIAGNOSIS — E039 Hypothyroidism, unspecified: Secondary | ICD-10-CM | POA: Diagnosis not present

## 2024-05-10 DIAGNOSIS — G20A1 Parkinson's disease without dyskinesia, without mention of fluctuations: Secondary | ICD-10-CM | POA: Insufficient documentation

## 2024-05-10 DIAGNOSIS — Z923 Personal history of irradiation: Secondary | ICD-10-CM | POA: Insufficient documentation

## 2024-05-10 DIAGNOSIS — Z9079 Acquired absence of other genital organ(s): Secondary | ICD-10-CM | POA: Diagnosis not present

## 2024-05-10 DIAGNOSIS — Z5112 Encounter for antineoplastic immunotherapy: Secondary | ICD-10-CM | POA: Insufficient documentation

## 2024-05-10 DIAGNOSIS — L409 Psoriasis, unspecified: Secondary | ICD-10-CM | POA: Diagnosis not present

## 2024-05-10 DIAGNOSIS — Z85828 Personal history of other malignant neoplasm of skin: Secondary | ICD-10-CM | POA: Insufficient documentation

## 2024-05-10 LAB — CMP (CANCER CENTER ONLY)
ALT: <5 U/L (ref 0–44)
AST: 18 U/L (ref 15–41)
Albumin: 4.3 g/dL (ref 3.5–5.0)
Alkaline Phosphatase: 64 U/L (ref 38–126)
Anion gap: 11 (ref 5–15)
BUN: 16 mg/dL (ref 8–23)
CO2: 24 mmol/L (ref 22–32)
Calcium: 9.4 mg/dL (ref 8.9–10.3)
Chloride: 106 mmol/L (ref 98–111)
Creatinine: 1.45 mg/dL — ABNORMAL HIGH (ref 0.61–1.24)
GFR, Estimated: 47 mL/min — ABNORMAL LOW (ref >60)
Glucose, Bld: 85 mg/dL (ref 70–99)
Potassium: 4.1 mmol/L (ref 3.5–5.1)
Sodium: 142 mmol/L (ref 135–145)
Total Bilirubin: 0.5 mg/dL (ref 0.0–1.2)
Total Protein: 7 g/dL (ref 6.5–8.1)

## 2024-05-10 LAB — CBC WITH DIFFERENTIAL (CANCER CENTER ONLY)
Abs Immature Granulocytes: 0.04 K/uL (ref 0.00–0.07)
Basophils Absolute: 0.1 K/uL (ref 0.0–0.1)
Basophils Relative: 1 %
Eosinophils Absolute: 0.1 K/uL (ref 0.0–0.5)
Eosinophils Relative: 2 %
HCT: 40.2 % (ref 39.0–52.0)
Hemoglobin: 13.1 g/dL (ref 13.0–17.0)
Immature Granulocytes: 1 %
Lymphocytes Relative: 18 %
Lymphs Abs: 1.2 K/uL (ref 0.7–4.0)
MCH: 30.8 pg (ref 26.0–34.0)
MCHC: 32.6 g/dL (ref 30.0–36.0)
MCV: 94.4 fL (ref 80.0–100.0)
Monocytes Absolute: 0.5 K/uL (ref 0.1–1.0)
Monocytes Relative: 7 %
Neutro Abs: 4.9 K/uL (ref 1.7–7.7)
Neutrophils Relative %: 71 %
Platelet Count: 237 K/uL (ref 150–400)
RBC: 4.26 MIL/uL (ref 4.22–5.81)
RDW: 12.4 % (ref 11.5–15.5)
WBC Count: 6.8 K/uL (ref 4.0–10.5)
nRBC: 0 % (ref 0.0–0.2)

## 2024-05-10 NOTE — Progress Notes (Addendum)
 [facility]   Telephone:(336) [phone] Fax:(336) [phone]    Patient Care Team: Leonel Cole, MD as PCP - General (Family Medicine)   CHIEF COMPLAINT: Follow-up bladder cancer  CURRENT THERAPY: Pembrolizumab  q. 21 days  INTERVAL HISTORY Mr. Niblett returns for follow-up as scheduled, last seen by Dr. Dino 04/19/2024.  Pembro was held for skin eruption, he was seen by Dr. Alm with biopsy confirming psoriasis.  He has started clobetasol  twice daily which is helping.  Doing well otherwise.  Denies hematuria.denies diarrhea, cough/dyspnea, or any other new or worsening complaints.  ROS  All other systems reviewed and negative  Past Medical History:  Diagnosis Date   bladder ca dx'd 06/2018   Hypothyroidism    Skin cancer      Past Surgical History:  Procedure Laterality Date   PROSTATECTOMY  2013   TRANSURETHRAL RESECTION OF BLADDER TUMOR WITH GYRUS (TURBT-GYRUS)  07/18/2018     Outpatient Encounter Medications as of 05/10/2024  Medication Sig   carbidopa -levodopa  (SINEMET  IR) 25-100 MG tablet 2 at 7am/11am/4pm   levothyroxine (SYNTHROID) 75 MCG tablet Take 75 mcg by mouth daily before breakfast.   clobetasol  ointment (TEMOVATE ) 0.05 % Apply 1 Application topically 2 (two) times daily. Apply topically to affected areas twice a day. Mix with mupirocin  (Patient not taking: Reported on 05/10/2024)   mupirocin  ointment (BACTROBAN ) 2 % Apply 1 Application topically 2 (two) times daily. Apply topically to affected areas twice a day. Mix with clobetasol  (Patient not taking: Reported on 05/10/2024)   No facility-administered encounter medications on file as of 05/10/2024.     Today's Vitals   05/10/24 1000 05/10/24 1014  BP:  134/68  Pulse:  79  Resp:  16  Temp:  98.2 F (36.8 C)  TempSrc:  Temporal  SpO2:  100%  Weight:  144 lb 12.8 oz (65.7 kg)  Height:  5' 9 (1.753 m)  PainSc: 0-No pain    Body mass index is 21.38 kg/m.   ECOG PERFORMANCE STATUS: 1  - Symptomatic but completely ambulatory  PHYSICAL EXAM GENERAL:alert, no distress and comfortable SKIN: Scattered dried psoriatic plaque eruptions to bilateral arms and legs.  No rash EYES: sclera clear NECK: without mass LYMPH:  no palpable cervical or supraclavicular lymphadenopathy  LUNGS: clear with normal breathing effort HEART: regular rate & rhythm, no lower extremity edema ABDOMEN: abdomen soft, non-tender and normal bowel sounds NEURO: alert & oriented x 3 with fluent speech, no focal motor/sensory deficits   CBC    Latest Ref Rng & Units 05/10/2024   10:19 AM 04/19/2024   10:18 AM 03/29/2024    9:25 AM  CBC  WBC 4.0 - 10.5 K/uL 6.8  7.6  6.5   Hemoglobin 13.0 - 17.0 g/dL 86.8  86.6  86.7   Hematocrit 39.0 - 52.0 % 40.2  41.6  40.6   Platelets 150 - 400 K/uL 237  227  225       CMP     Latest Ref Rng & Units 05/10/2024   10:19 AM 04/19/2024   10:18 AM 03/29/2024    9:25 AM  CMP  Glucose 70 - 99 mg/dL 85  889  892   BUN 8 - 23 mg/dL 16  17  18    Creatinine 0.61 - 1.24 mg/dL 8.54  8.55  8.58   Sodium 135 - 145 mmol/L 142  142  143   Potassium 3.5 - 5.1 mmol/L 4.1  4.3  4.3   Chloride  98 - 111 mmol/L 106  106  108   CO2 22 - 32 mmol/L 24  25  24    Calcium 8.9 - 10.3 mg/dL 9.4  9.7  9.6   Total Protein 6.5 - 8.1 g/dL 7.0  7.3  7.0   Total Bilirubin 0.0 - 1.2 mg/dL 0.5  0.6  0.6   Alkaline Phos 38 - 126 U/L 64  65  65   AST 15 - 41 U/L 18  18  17    ALT 0 - 44 U/L <5  <5  <5       ASSESSMENT & PLAN:  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  TURBT 07/18/2018.  Found to have high-grade invasive urothelial carcinoma; with suspicion for invasion into the muscularis propria, T2 lesion with grade 3 histology.  He completed radiation therapy with weekly carboplatin  09/04/2018 through 10/25/2018. Cystoscopy 06/09/2023-nodular lesion left anterior bladder wall, 2 other smaller lesions to the right CTs 07/03/2023-negative for metastatic disease Cystoscopy/TURBT  07/26/2023-scattered patches of sessile appearing tumors with associated dystrophic calcifications along the left anterior bladder wall and dome; additional small patches of sessile appearing tumor along the posterior bladder wall left greater than right and right anterior bladder wall; entirety of the left anterior sessile tumor was resected, remainder of the tumors were cauterized, pathology-high-grade invasive urothelial carcinoma, muscularis propria not present, lymphovascular invasion not identified, tumor invades at least lamina propria.  PD-L1 CPS 3 Cycle 1 Pembrolizumab  08/29/2023 Cycle 2 Pembrolizumab  09/19/2023 Cycle 3 pembrolizumab  10/11/2023 Cycle 4 pembrolizumab  11/04/2023 Cystoscopy 11/10/2023-bladder with mild trabeculation with lesions.  On the dome of the bladder there is a lesion measuring approximately 4 cm in diameter with some necrotic debris, probably a persistent cancer.  No other lesions noted. Cycle 5 Pembrolizumab  11/25/2023 Cycle 6 pembrolizumab  12/16/2023 CTs 12/30/2023-no evidence of metastatic disease in the chest, abdomen, pelvis Cycle 7 Pembrolizumab  01/06/2024 Cycle 8 Pembrolizumab  01/27/2024 Cycle 9 pembrolizumab  02/17/2024 Cycle 10 Pembrolizumab  03/08/2024 Cystoscopy 03/15/2024 papillary lesion at the bladder dome-less prominent Cycle 11 pembrolizumab  03/29/2024 Cycle 12 pembrolizumab  held 7/24 for skin eruption Cycle 12 Pembrolizumab  resumed 05/11/2023 Recent diagnosis of Parkinson's, followed by Dr. Evonnie Hypothyroid Multiple ulcerated/plaque lesions on exam 04/20/2023.  Biopsy per Dr. Alm confirmed psoriasis.  On clobetasol  twice daily.  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 Otting appears stable. We discussed his new diagnosis of psoriasis is likely secondary to immune-checkpoint inhibitor. It appears to be Grade I-II and improving with topical steroids. We reviewed management algorithm and recommend to resume Pembrolizumab  and continue monitoring. He will  follow up with Dr. Alm 8/15. If the skin condition worsens, we will hold Pembro.   Labs reviewed, adequate for treatment. Proceed with cycle 12 Pembro.  Return in 3 weeks for lab, follow up, and cycle 13. He will return to Dr. Nicholaus for cystoscopy next month.  Patient seen with Dr. Cloretta.   All questions were answered. The patient knows to call the clinic with any problems, questions or concerns. No barriers to learning were detected.  Kenyada Hy K Kayson Bullis, NP 05/10/2024    This was a shared visit with Amulya Quintin.  Mr. Brigante is interviewed and examined.  A skin biopsy confirmed psoriasis.  The rash appears partially improved with clobetasol  ointment.  He will continue clobetasol .  The rash is most likely related to toxicity from the pembrolizumab .  The plan is to continue pembrolizumab  as long as the rash is improving.  I was present for greater than 50% of  today's visit.  I performed medical decision making.  Arvella Hof, MD

## 2024-05-10 NOTE — Progress Notes (Signed)
 Difficult stick and will need to continue Keytruda . Patient agrees to port placement. Orders placed. Will reach out to IR scheduler tomorrow.

## 2024-05-10 NOTE — Progress Notes (Unsigned)
 Patient seen today for Day 1, Cycle 13 of Keytruda . Three unsuccessful attempts at peripheral IV (PIV) placement were made. Patient requested to return another day for treatment, and this plan was discussed with and approved by Olam, NP.  The option of port-a-cath placement was discussed with the patient, including its benefits and the fact that hospitalization is not required for the procedure. The patient was very agreeable to this plan. The provider team has been informed to schedule the appointment for port placement.  The patient has been rescheduled to return on Monday, 05/14/2024, for the Keytruda  treatment missed today. A printed copy of the schedule was provided to the patient, and he is aware of the appointment time. He also understands that he will be contacted at a later time with details about the port placement appointment.

## 2024-05-10 NOTE — Progress Notes (Signed)
 Patient seen by Olam Ned NP today  Vitals are within treatment parameters:Yes   Labs are within treatment parameters: No (Please specify and give further instructions.) creatinine 1.4 it's ok to proceed  Treatment plan has been signed: Yes   Per physician team, Patient is ready for treatment and there are NO modifications to the treatment plan.

## 2024-05-11 ENCOUNTER — Encounter: Payer: Self-pay | Admitting: Oncology

## 2024-05-11 ENCOUNTER — Telehealth: Payer: Self-pay | Admitting: *Deleted

## 2024-05-11 NOTE — Telephone Encounter (Signed)
 Scheduled PAC in IR at [facility] on 05/16/22 with 1130 arrival time. Will need no food after midnight and clear liquids after 0700 that morning. Needs driver and someone to stay with him remainder of day.  Not able to reach him at this time.

## 2024-05-11 NOTE — Telephone Encounter (Signed)
 Called Anthony Pacheco with PAC appointment. He now has decided he does not want a port. Notified IR to cancel.

## 2024-05-14 ENCOUNTER — Encounter: Payer: Self-pay | Admitting: Dermatology

## 2024-05-14 ENCOUNTER — Inpatient Hospital Stay

## 2024-05-14 ENCOUNTER — Ambulatory Visit: Admitting: Dermatology

## 2024-05-14 VITALS — BP 130/67 | HR 61 | Temp 98.0°F | Resp 18

## 2024-05-14 VITALS — BP 115/71 | HR 85

## 2024-05-14 DIAGNOSIS — N1831 Chronic kidney disease, stage 3a: Secondary | ICD-10-CM | POA: Insufficient documentation

## 2024-05-14 DIAGNOSIS — H9190 Unspecified hearing loss, unspecified ear: Secondary | ICD-10-CM | POA: Insufficient documentation

## 2024-05-14 DIAGNOSIS — E039 Hypothyroidism, unspecified: Secondary | ICD-10-CM | POA: Insufficient documentation

## 2024-05-14 DIAGNOSIS — L409 Psoriasis, unspecified: Secondary | ICD-10-CM | POA: Diagnosis not present

## 2024-05-14 DIAGNOSIS — N529 Male erectile dysfunction, unspecified: Secondary | ICD-10-CM | POA: Insufficient documentation

## 2024-05-14 DIAGNOSIS — R413 Other amnesia: Secondary | ICD-10-CM | POA: Insufficient documentation

## 2024-05-14 DIAGNOSIS — I700 Atherosclerosis of aorta: Secondary | ICD-10-CM | POA: Insufficient documentation

## 2024-05-14 DIAGNOSIS — E7800 Pure hypercholesterolemia, unspecified: Secondary | ICD-10-CM | POA: Insufficient documentation

## 2024-05-14 DIAGNOSIS — Z5112 Encounter for antineoplastic immunotherapy: Secondary | ICD-10-CM | POA: Diagnosis not present

## 2024-05-14 DIAGNOSIS — C4491 Basal cell carcinoma of skin, unspecified: Secondary | ICD-10-CM | POA: Insufficient documentation

## 2024-05-14 DIAGNOSIS — C679 Malignant neoplasm of bladder, unspecified: Secondary | ICD-10-CM

## 2024-05-14 MED ORDER — SODIUM CHLORIDE 0.9 % IV SOLN
200.0000 mg | Freq: Once | INTRAVENOUS | Status: AC
  Administered 2024-05-14: 200 mg via INTRAVENOUS
  Filled 2024-05-14: qty 8

## 2024-05-14 MED ORDER — CALCIPOTRIENE 0.005 % EX CREA: TOPICAL_CREAM | Freq: Two times a day (BID) | CUTANEOUS | 6 refills | Status: AC

## 2024-05-14 MED ORDER — SODIUM CHLORIDE 0.9 % IV SOLN: INTRAVENOUS | Status: DC

## 2024-05-14 NOTE — Patient Instructions (Addendum)
 Date: Mon May 14 2024  Hello Anthony Pacheco,  Thank you for visiting today. Here is a summary of the key instructions:  Diagnosis: Psoriasis  - Medications:   - Use clobetasol  cream twice a day for 2 weeks   - After 2 weeks, switch to calcipotriene  cream twice a day for 2 weeks   - Alternate between clobetasol  and calcipotriene  every 2 weeks for about 2 months   - Apply creams to affected areas, including legs and belly   - Stop using creams when skin is clear  - Skin Care:   - Use CeraVe Anti-Itch lotion on whole body every day   - Buy the large bottle with a red pump at the store  - Follow-up:   - Return for follow-up appointment in 4 months  - Other Instructions:   - Get 20 minutes of sun exposure on legs daily   - Wear shorts while sitting on patio to expose legs to sun   - If new spots appear, restart the cream treatment   - Write down any questions and bring them to the next visit  Please reach out if you have any questions or concerns.  Warm regards,  Dr. Delon Lenis Dermatology     Important Information   Due to recent changes in healthcare laws, you may see results of your pathology and/or laboratory studies on MyChart before the doctors have had a chance to review them. We understand that in some cases there may be results that are confusing or concerning to you. Please understand that not all results are received at the same time and often the doctors may need to interpret multiple results in order to provide you with the best plan of care or course of treatment. Therefore, we ask that you please give us  2 business days to thoroughly review all your results before contacting the office for clarification. Should we see a critical lab result, you will be contacted sooner.     If You Need Anything After Your Visit   If you have any questions or concerns for your doctor, please call our main line at [phone]. If no one answers, please leave a voicemail as directed  and we will return your call as soon as possible. Messages left after 4 pm will be answered the following business day.    You may also send us  a message via MyChart. We typically respond to MyChart messages within 1-2 business days.  For prescription refills, please ask your pharmacy to contact our office. Our fax number is [phone].  If you have an urgent issue when the clinic is closed that cannot wait until the next business day, you can page your doctor at the number below.     Please note that while we do our best to be available for urgent issues outside of office hours, we are not available 24/7.    If you have an urgent issue and are unable to reach us , you may choose to seek medical care at your doctor's office, retail clinic, urgent care center, or emergency room.   If you have a medical emergency, please immediately call 911 or go to the emergency department. In the event of inclement weather, please call our main line at [phone] for an update on the status of any delays or closures.  Dermatology Medication Tips: Please keep the boxes that topical medications come in in order to help keep track of the instructions about where and how to use these. Pharmacies  typically print the medication instructions only on the boxes and not directly on the medication tubes.   If your medication is too expensive, please contact our office at [phone] or send us  a message through MyChart.    We are unable to tell what your co-pay for medications will be in advance as this is different depending on your insurance coverage. However, we may be able to find a substitute medication at lower cost or fill out paperwork to get insurance to cover a needed medication.    If a prior authorization is required to get your medication covered by your insurance company, please allow us  1-2 business days to complete this process.   Drug prices often vary depending on where the prescription is filled  and some pharmacies may offer cheaper prices.   The website www.goodrx.com contains coupons for medications through different pharmacies. The prices here do not account for what the cost may be with help from insurance (it may be cheaper with your insurance), but the website can give you the price if you did not use any insurance.  - You can print the associated coupon and take it with your prescription to the pharmacy.  - You may also stop by our office during regular business hours and pick up a GoodRx coupon card.  - If you need your prescription sent electronically to a different pharmacy, notify our office through [facility] or by phone at [phone]

## 2024-05-14 NOTE — Progress Notes (Signed)
   Follow-Up Visit   Subjective  Anthony Pacheco is a 88 y.o. male who presents for the following: Bx Results   Patient present today for follow up visit for Biopsy Results. Patient was last evaluated on 04/26/2024. At this visit patient was prescribed Clobetasol  and mupirocin . Patient reports sxs are improving but not at goal. Patient denies medication changes. Patient denies joint pains.  The following portions of the chart were reviewed this encounter and updated as appropriate: medications, allergies, medical history  Review of Systems:  No other skin or systemic complaints except as noted in HPI or Assessment and Plan.  Objective  Well appearing patient in no apparent distress; mood and affect are within normal limits.  A full examination was performed including scalp, head, eyes, ears, nose, lips, neck, chest, axillae, abdomen, back, buttocks, bilateral upper extremities, bilateral lower extremities, hands, feet, fingers, toes, fingernails, and toenails. All findings within normal limits unless otherwise noted below.   Relevant exam findings are noted in the Assessment and Plan.                        REPORT OF DERMATOPATHOLOGY FINAL DIAGNOSIS and MICROSCOPIC DESCRIPTION Diagnosis Skin , left lower leg - anterior PSORIASIS Microscopic Description There is regular acanthosis of the epidermis with foci of hypogranulosis and overlying parakeratosis. Some neutrophils are noted in the cornified layer. There is a perivascular lymphocytic infiltrate without atypia in the superficial dermis. Following review of the hematoxylin and eosin sections, a PAS stain was obtained to exclude a fungal infection. The PAS stain is negative for fungal organisms. The features are consistent with psoriasis.   Assessment & Plan   1. Psoriasis (biopsy confirmed) - Assessment: Patient has been diagnosed with psoriasis based on clinical presentation and biopsy results. The condition is  causing itchy spots on the skin, primarily on the legs and belly. The patient has been using clobetasol  cream twice daily for the past 2 weeks, which has shown some improvement. Psoriasis is understood to have a genetic component, but can manifest unexpectedly even in individuals with the genetic predisposition. - Plan:    Continue clobetasol  cream twice daily until the end of the current week    Start calcipotriene  cream twice daily for 2 weeks    Alternate between calcipotriene  and clobetasol  creams, each for 2-week periods, for approximately 2 months or until skin clears    Apply creams to affected areas on legs and belly    Discontinue treatment when skin is flat, but restart if new spots appear    Recommend 20 minutes of sun exposure on legs daily    Use CeraVe Anti-Itch lotion on entire body daily after showering    If topical treatments are ineffective, consider oral medication at follow-up    Patient educated on chronic nature of psoriasis, potential for recurrence, and that it is not infectious  Follow-up in 4 months to assess progress.   PSORIASIS   Related Medications calcipotriene  (DOVONOX) 0.005 % cream Apply topically 2 (two) times daily. Apply 2 times daily for 2 weeks alternate 2 weeks on and 2 weeks off  Return in about 5 months (around 10/14/2024) for Psoriasis F/U.  I, Jetta Ager, am acting as Neurosurgeon for [employer], DO.  Documentation: I have reviewed the above documentation for accuracy and completeness, and I agree with the above.  Delon Lenis, DO

## 2024-05-14 NOTE — Patient Instructions (Signed)
 [facility] - A DEPT OF [facility]  Discharge Instructions: Thank you for choosing [facility] to provide your oncology and hematology care.   If you have a lab appointment with the Cancer Center, please go directly to the Cancer Center and check in at the registration area.   Wear comfortable clothing and clothing appropriate for easy access to any Portacath or PICC line.   We strive to give you quality time with your provider. You may need to reschedule your appointment if you arrive late (15 or more minutes).  Arriving late affects you and other patients whose appointments are after yours.  Also, if you miss three or more appointments without notifying the office, you may be dismissed from the clinic at the provider's discretion.      For prescription refill requests, have your pharmacy contact our office and allow 72 hours for refills to be completed.    Today you received the following chemotherapy and/or immunotherapy agents :  Pembrolizumab .   To help prevent nausea and vomiting after your treatment, we encourage you to take your nausea medication as directed.  BELOW ARE SYMPTOMS THAT SHOULD BE REPORTED IMMEDIATELY: *FEVER GREATER THAN 100.4 F (38 C) OR HIGHER *CHILLS OR SWEATING *NAUSEA AND VOMITING THAT IS NOT CONTROLLED WITH YOUR NAUSEA MEDICATION *UNUSUAL SHORTNESS OF BREATH *UNUSUAL BRUISING OR BLEEDING *URINARY PROBLEMS (pain or burning when urinating, or frequent urination) *BOWEL PROBLEMS (unusual diarrhea, constipation, pain near the anus) TENDERNESS IN MOUTH AND THROAT WITH OR WITHOUT PRESENCE OF ULCERS (sore throat, sores in mouth, or a toothache) UNUSUAL RASH, SWELLING OR PAIN  UNUSUAL VAGINAL DISCHARGE OR ITCHING   Items with * indicate a potential emergency and should be followed up as soon as possible or go to the Emergency Department if any problems should occur.  Please show the CHEMOTHERAPY ALERT CARD or  IMMUNOTHERAPY ALERT CARD at check-in to the Emergency Department and triage nurse.  Should you have questions after your visit or need to cancel or reschedule your appointment, please contact [facility] - A DEPT OF [facility]  Dept: [phone]  and follow the prompts.  Office hours are 8:00 a.m. to 4:30 p.m. Monday - Friday. Please note that voicemails left after 4:00 p.m. may not be returned until the following business day.  We are closed weekends and major holidays. You have access to a nurse at all times for urgent questions. Please call the main number to the clinic Dept: [phone] and follow the prompts.   For any non-urgent questions, you may also contact your provider using MyChart. We now offer e-Visits for anyone 30 and older to request care online for non-urgent symptoms. For details visit [email or web address].   Also download the MyChart app! Go to the app store, search MyChart, open the app, select [facility], and log in with your MyChart username and password.

## 2024-05-16 ENCOUNTER — Other Ambulatory Visit (HOSPITAL_COMMUNITY)

## 2024-05-16 ENCOUNTER — Other Ambulatory Visit: Payer: Self-pay

## 2024-05-16 ENCOUNTER — Ambulatory Visit (HOSPITAL_COMMUNITY)

## 2024-05-27 ENCOUNTER — Other Ambulatory Visit: Payer: Self-pay | Admitting: Oncology

## 2024-05-29 ENCOUNTER — Telehealth: Payer: Self-pay | Admitting: Oncology

## 2024-05-29 NOTE — Telephone Encounter (Signed)
 Left message with PT about new appt date and time

## 2024-05-31 ENCOUNTER — Inpatient Hospital Stay: Admitting: Nurse Practitioner

## 2024-05-31 ENCOUNTER — Inpatient Hospital Stay

## 2024-06-07 ENCOUNTER — Inpatient Hospital Stay: Admitting: Nurse Practitioner

## 2024-06-07 ENCOUNTER — Inpatient Hospital Stay

## 2024-06-07 ENCOUNTER — Encounter: Payer: Self-pay | Admitting: Nurse Practitioner

## 2024-06-07 ENCOUNTER — Inpatient Hospital Stay: Attending: Nurse Practitioner

## 2024-06-07 VITALS — BP 119/64 | HR 57 | Temp 97.3°F | Resp 16 | Wt 144.6 lb

## 2024-06-07 DIAGNOSIS — Z923 Personal history of irradiation: Secondary | ICD-10-CM | POA: Insufficient documentation

## 2024-06-07 DIAGNOSIS — E039 Hypothyroidism, unspecified: Secondary | ICD-10-CM | POA: Insufficient documentation

## 2024-06-07 DIAGNOSIS — Z9221 Personal history of antineoplastic chemotherapy: Secondary | ICD-10-CM | POA: Diagnosis not present

## 2024-06-07 DIAGNOSIS — G20A1 Parkinson's disease without dyskinesia, without mention of fluctuations: Secondary | ICD-10-CM | POA: Insufficient documentation

## 2024-06-07 DIAGNOSIS — C679 Malignant neoplasm of bladder, unspecified: Secondary | ICD-10-CM | POA: Diagnosis not present

## 2024-06-07 LAB — CBC WITH DIFFERENTIAL (CANCER CENTER ONLY)
Abs Immature Granulocytes: 0.04 K/uL (ref 0.00–0.07)
Basophils Absolute: 0.1 K/uL (ref 0.0–0.1)
Basophils Relative: 2 %
Eosinophils Absolute: 0.3 K/uL (ref 0.0–0.5)
Eosinophils Relative: 4 %
HCT: 40.2 % (ref 39.0–52.0)
Hemoglobin: 12.9 g/dL — ABNORMAL LOW (ref 13.0–17.0)
Immature Granulocytes: 1 %
Lymphocytes Relative: 21 %
Lymphs Abs: 1.6 K/uL (ref 0.7–4.0)
MCH: 30.4 pg (ref 26.0–34.0)
MCHC: 32.1 g/dL (ref 30.0–36.0)
MCV: 94.8 fL (ref 80.0–100.0)
Monocytes Absolute: 0.6 K/uL (ref 0.1–1.0)
Monocytes Relative: 8 %
Neutro Abs: 4.9 K/uL (ref 1.7–7.7)
Neutrophils Relative %: 64 %
Platelet Count: 236 K/uL (ref 150–400)
RBC: 4.24 MIL/uL (ref 4.22–5.81)
RDW: 12.9 % (ref 11.5–15.5)
WBC Count: 7.4 K/uL (ref 4.0–10.5)
nRBC: 0 % (ref 0.0–0.2)

## 2024-06-07 LAB — CMP (CANCER CENTER ONLY)
ALT: <5 U/L (ref 0–44)
AST: 27 U/L (ref 15–41)
Albumin: 4.4 g/dL (ref 3.5–5.0)
Alkaline Phosphatase: 66 U/L (ref 38–126)
Anion gap: 11 (ref 5–15)
BUN: 18 mg/dL (ref 8–23)
CO2: 25 mmol/L (ref 22–32)
Calcium: 9.9 mg/dL (ref 8.9–10.3)
Chloride: 107 mmol/L (ref 98–111)
Creatinine: 1.51 mg/dL — ABNORMAL HIGH (ref 0.61–1.24)
GFR, Estimated: 45 mL/min — ABNORMAL LOW (ref >60)
Glucose, Bld: 90 mg/dL (ref 70–99)
Potassium: 4.3 mmol/L (ref 3.5–5.1)
Sodium: 143 mmol/L (ref 135–145)
Total Bilirubin: 0.6 mg/dL (ref 0.0–1.2)
Total Protein: 7.3 g/dL (ref 6.5–8.1)

## 2024-06-07 NOTE — Progress Notes (Unsigned)
 [facility] OFFICE PROGRESS NOTE   Diagnosis: Bladder cancer  INTERVAL HISTORY:   Mr. Matousek returns as scheduled.  He resumed Pembrolizumab  05/11/2023.  He notes a blister type lesion on the sole of the left foot new this week.  Otherwise he feels skin rash is stable.  He is applying clobetasol  and calcipotriene  creams per dermatology recommendation.  No diarrhea.  No nausea or vomiting.  Objective:  Vital signs in last 24 hours:  Blood pressure 119/64, pulse (!) 57, temperature (!) 97.3 F (36.3 C), temperature source Temporal, resp. rate 16, weight 144 lb 9.6 oz (65.6 kg), SpO2 90%.    HEENT: No thrush or ulcers. Resp: Lungs clear bilaterally. Cardio: Regular rate and rhythm. GI: No hepatosplenomegaly.  Nontender. Vascular: No leg edema. Skin: Dry plaque-like skin lesions scattered over the forearms and lower legs.  4 distinct small raised erythematous lesions left flank region, appear new.  Large blister appearing lesion medial sole left foot.  Palms without erythema or lesions.   Lab Results:  Lab Results  Component Value Date   WBC 7.4 06/07/2024   HGB 12.9 (L) 06/07/2024   HCT 40.2 06/07/2024   MCV 94.8 06/07/2024   PLT 236 06/07/2024   NEUTROABS 4.9 06/07/2024    Imaging:  No results found.  Medications: I have reviewed the patient's current medications.  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  TURBT 07/18/2018.  Found to have high-grade invasive urothelial carcinoma; with suspicion for invasion into the muscularis propria, T2 lesion with grade 3 histology.  He completed radiation therapy with weekly carboplatin  09/04/2018 through 10/25/2018. Cystoscopy 06/09/2023-nodular lesion left anterior bladder wall, 2 other smaller lesions to the right CTs 07/03/2023-negative for metastatic disease Cystoscopy/TURBT 07/26/2023-scattered patches of sessile appearing tumors with associated dystrophic calcifications along the left  anterior bladder wall and dome; additional small patches of sessile appearing tumor along the posterior bladder wall left greater than right and right anterior bladder wall; entirety of the left anterior sessile tumor was resected, remainder of the tumors were cauterized, pathology-high-grade invasive urothelial carcinoma, muscularis propria not present, lymphovascular invasion not identified, tumor invades at least lamina propria.  PD-L1 CPS 3 Cycle 1 Pembrolizumab  08/29/2023 Cycle 2 Pembrolizumab  09/19/2023 Cycle 3 pembrolizumab  10/11/2023 Cycle 4 pembrolizumab  11/04/2023 Cystoscopy 11/10/2023-bladder with mild trabeculation with lesions.  On the dome of the bladder there is a lesion measuring approximately 4 cm in diameter with some necrotic debris, probably a persistent cancer.  No other lesions noted. Cycle 5 Pembrolizumab  11/25/2023 Cycle 6 pembrolizumab  12/16/2023 CTs 12/30/2023-no evidence of metastatic disease in the chest, abdomen, pelvis Cycle 7 Pembrolizumab  01/06/2024 Cycle 8 Pembrolizumab  01/27/2024 Cycle 9 pembrolizumab  02/17/2024 Cycle 10 Pembrolizumab  03/08/2024 Cystoscopy 03/15/2024 papillary lesion at the bladder dome-less prominent Cycle 11 pembrolizumab  03/29/2024 Cycle 12 pembrolizumab  held 7/24 for skin eruption Cycle 12 Pembrolizumab  resumed 05/11/2023 06/07/2024 treatment held due to new skin lesions Recent diagnosis of Parkinson's, followed by Dr. Evonnie Hypothyroid Multiple ulcerated/plaque lesions on exam 04/20/2023.  Biopsy per Dr. Alm confirmed psoriasis.  On clobetasol  twice daily.  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 appears stable.  Pembrolizumab  was resumed 3 weeks ago.  He has several new skin lesions including a blister type lesion on the sole of the left foot.  We are holding today's treatment.  He will contact the office if the rash worsens.  He will continue topical treatment as directed by dermatology.  He has a follow-up appointment with Dr. Nicholaus in 2  weeks.  He will return for follow-up here in 3 weeks.  We are available to see him sooner if needed.  Patient seen with Dr. Cloretta.    Olam Ned ANP/GNP-BC   06/07/2024  11:52 AM   This was a shared visit with Olam Ned.  Mr. Chelsea was interviewed and examined.  He continues to have a significant psoriatic type rash, likely secondary to pembrolizumab .  He has a new blister lesion at the sole of the left foot.  Pembrolizumab  will be placed on hold.  I was present for greater than 50% of today's visit.  I performed medical decision making.  Arvella Cloretta, MD

## 2024-06-08 ENCOUNTER — Encounter: Payer: Self-pay | Admitting: Oncology

## 2024-06-21 ENCOUNTER — Ambulatory Visit: Admitting: Oncology

## 2024-06-21 ENCOUNTER — Other Ambulatory Visit

## 2024-06-21 ENCOUNTER — Ambulatory Visit

## 2024-06-28 ENCOUNTER — Inpatient Hospital Stay

## 2024-06-28 ENCOUNTER — Inpatient Hospital Stay: Attending: Nurse Practitioner | Admitting: Oncology

## 2024-06-28 ENCOUNTER — Ambulatory Visit

## 2024-06-28 ENCOUNTER — Other Ambulatory Visit

## 2024-06-28 VITALS — BP 112/64 | HR 80 | Temp 98.1°F | Resp 18 | Ht 69.0 in | Wt 147.3 lb

## 2024-06-28 DIAGNOSIS — Z923 Personal history of irradiation: Secondary | ICD-10-CM | POA: Insufficient documentation

## 2024-06-28 DIAGNOSIS — C679 Malignant neoplasm of bladder, unspecified: Secondary | ICD-10-CM | POA: Diagnosis not present

## 2024-06-28 DIAGNOSIS — E039 Hypothyroidism, unspecified: Secondary | ICD-10-CM | POA: Insufficient documentation

## 2024-06-28 DIAGNOSIS — G20A1 Parkinson's disease without dyskinesia, without mention of fluctuations: Secondary | ICD-10-CM | POA: Insufficient documentation

## 2024-06-28 LAB — CMP (CANCER CENTER ONLY)
ALT: <5 U/L — ABNORMAL LOW (ref 10–47)
AST: 19 U/L (ref 15–41)
Albumin: 4.1 g/dL (ref 3.5–5.0)
Alkaline Phosphatase: 64 U/L (ref 38–126)
Anion gap: 10 (ref 5–15)
BUN: 18 mg/dL (ref 8–23)
CO2: 25 mmol/L (ref 22–32)
Calcium: 9.4 mg/dL (ref 8.9–10.3)
Chloride: 108 mmol/L (ref 98–111)
Creatinine: 1.42 mg/dL — ABNORMAL HIGH (ref 0.61–1.24)
GFR, Estimated: 48 mL/min — ABNORMAL LOW (ref >60)
Glucose, Bld: 84 mg/dL (ref 70–99)
Potassium: 4 mmol/L (ref 3.5–5.1)
Sodium: 144 mmol/L (ref 135–145)
Total Bilirubin: 0.4 mg/dL (ref 0.0–1.2)
Total Protein: 6.9 g/dL (ref 6.5–8.1)

## 2024-06-28 LAB — CBC WITH DIFFERENTIAL (CANCER CENTER ONLY)
Abs Immature Granulocytes: 0.03 K/uL (ref 0.00–0.07)
Basophils Absolute: 0.1 K/uL (ref 0.0–0.1)
Basophils Relative: 1 %
Eosinophils Absolute: 0.2 K/uL (ref 0.0–0.5)
Eosinophils Relative: 4 %
HCT: 39.5 % (ref 39.0–52.0)
Hemoglobin: 12.5 g/dL — ABNORMAL LOW (ref 13.0–17.0)
Immature Granulocytes: 1 %
Lymphocytes Relative: 20 %
Lymphs Abs: 1.2 K/uL (ref 0.7–4.0)
MCH: 30.7 pg (ref 26.0–34.0)
MCHC: 31.6 g/dL (ref 30.0–36.0)
MCV: 97.1 fL (ref 80.0–100.0)
Monocytes Absolute: 0.5 K/uL (ref 0.1–1.0)
Monocytes Relative: 8 %
Neutro Abs: 4.3 K/uL (ref 1.7–7.7)
Neutrophils Relative %: 66 %
Platelet Count: 221 K/uL (ref 150–400)
RBC: 4.07 MIL/uL — ABNORMAL LOW (ref 4.22–5.81)
RDW: 12.8 % (ref 11.5–15.5)
WBC Count: 6.3 K/uL (ref 4.0–10.5)
nRBC: 0 % (ref 0.0–0.2)

## 2024-06-28 NOTE — Progress Notes (Signed)
  [facility] OFFICE PROGRESS NOTE   Diagnosis: Bladder cancer  INTERVAL HISTORY:   Anthony Pacheco returns as scheduled.  He feels well.  Good appetite.  No hematuria.  He continues to have urinary frequency.  He underwent a cystoscopy with Dr. Nicholaus 06/21/2024.  A persistent 2 cm right bladder dome lesion was noted with a papillary appearance. He reports the skin lesions have dried up .  He is no longer using topical therapy.  He saw Dr. Alm last month. Objective:  Vital signs in last 24 hours:  Blood pressure 112/64, pulse 80, temperature 98.1 F (36.7 C), temperature source Temporal, resp. rate 18, height 5' 9 (1.753 m), weight 147 lb 4.8 oz (66.8 kg), SpO2 100%.    HEENT: No thrush Resp: Lungs clear bilaterally Cardio: Regular rate and rhythm with premature beats GI: No hepatosplenomegaly, no mass Vascular: No leg edema   Skin: Multiple erythematous scaling plaque lesions over the arms and legs.  Healing lesion at the plantar surface of the left foot   Lab Results:  Lab Results  Component Value Date   WBC 6.3 06/28/2024   HGB 12.5 (L) 06/28/2024   HCT 39.5 06/28/2024   MCV 97.1 06/28/2024   PLT 221 06/28/2024   NEUTROABS 4.3 06/28/2024    CMP  Lab Results  Component Value Date   NA 143 06/07/2024   K 4.3 06/07/2024   CL 107 06/07/2024   CO2 25 06/07/2024   GLUCOSE 90 06/07/2024   BUN 18 06/07/2024   CREATININE 1.51 (H) 06/07/2024   CALCIUM 9.9 06/07/2024   PROT 7.3 06/07/2024   ALBUMIN 4.4 06/07/2024   AST 27 06/07/2024   ALT <5 06/07/2024   ALKPHOS 66 06/07/2024   BILITOT 0.6 06/07/2024   GFRNONAA 45 (L) 06/07/2024   GFRAA 52 (L) 05/20/2020    Medications: I have reviewed the patient's current medications.   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  TURBT 07/18/2018.  Found to have high-grade invasive urothelial carcinoma; with suspicion for invasion into the muscularis propria, T2 lesion with grade 3  histology.  He completed radiation therapy with weekly carboplatin  09/04/2018 through 10/25/2018. Cystoscopy 06/09/2023-nodular lesion left anterior bladder wall, 2 other smaller lesions to the right CTs 07/03/2023-negative for metastatic disease Cystoscopy/TURBT 07/26/2023-scattered patches of sessile appearing tumors with associated dystrophic calcifications along the left anterior bladder wall and dome; additional small patches of sessile appearing tumor along the posterior bladder wall left greater than right and right anterior bladder wall; entirety of the left anterior sessile tumor was resected, remainder of the tumors were cauterized, pathology-high-grade invasive urothelial carcinoma, muscularis propria not present, lymphovascular invasion not identified, tumor invades at least lamina propria.  PD-L1 CPS 3 Cycle 1 Pembrolizumab  08/29/2023 Cycle 2 Pembrolizumab  09/19/2023 Cycle 3 pembrolizumab  10/11/2023 Cycle 4 pembrolizumab  11/04/2023 Cystoscopy 11/10/2023-bladder with mild trabeculation with lesions.  On the dome of the bladder there is a lesion measuring approximately 4 cm in diameter with some necrotic debris, probably a persistent cancer.  No other lesions noted. Cycle 5 Pembrolizumab  11/25/2023 Cycle 6 pembrolizumab  12/16/2023 CTs 12/30/2023-no evidence of metastatic disease in the chest, abdomen, pelvis Cycle 7 Pembrolizumab  01/06/2024 Cycle 8 Pembrolizumab  01/27/2024 Cycle 9 pembrolizumab  02/17/2024 Cycle 10 Pembrolizumab  03/08/2024 Cystoscopy 03/15/2024 papillary lesion at the bladder dome-less prominent Cycle 11 pembrolizumab  03/29/2024 Cycle 12 pembrolizumab  held 7/24 for skin eruption Cycle 12 Pembrolizumab  resumed 05/14/2024 06/07/2024 treatment held due to new skin lesions 06/21/2024 cystoscopy: Stable 2 cm right bladder dome papillary  lesion Recent diagnosis of Parkinson's, followed by Dr. Evonnie Hypothyroid Multiple ulcerated/plaque lesions on exam 04/20/2023.  Biopsy per Dr. Alm confirmed  psoriasis.  On clobetasol  twice daily.  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  He was treated with pembrolizumab  from 08/29/2023 through August 2025.  He has developed psoriasis while on pembrolizumab .  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  The small bladder tumor is stable.  I recommend discontinuing pembrolizumab .  Anthony Pacheco will return for an office visit in 6 weeks.  He will call for new symptoms.  He will resume topical therapy if the psoriatic lesions progress.  He will follow-up with Dr. Nicholaus for a repeat cystoscopy in 3 months.  Arley Hof, MD  06/28/2024  8:14 AM

## 2024-07-19 ENCOUNTER — Ambulatory Visit

## 2024-07-19 ENCOUNTER — Ambulatory Visit: Admitting: Oncology

## 2024-07-19 ENCOUNTER — Other Ambulatory Visit

## 2024-08-02 ENCOUNTER — Other Ambulatory Visit: Payer: Self-pay | Admitting: Oncology

## 2024-08-07 ENCOUNTER — Other Ambulatory Visit: Payer: Self-pay | Admitting: Dermatology

## 2024-08-09 ENCOUNTER — Encounter: Payer: Self-pay | Admitting: Dermatology

## 2024-08-09 ENCOUNTER — Inpatient Hospital Stay: Attending: Nurse Practitioner | Admitting: Nurse Practitioner

## 2024-08-09 ENCOUNTER — Encounter: Payer: Self-pay | Admitting: Nurse Practitioner

## 2024-08-09 ENCOUNTER — Ambulatory Visit: Admitting: Dermatology

## 2024-08-09 ENCOUNTER — Inpatient Hospital Stay

## 2024-08-09 VITALS — BP 136/77 | HR 96 | Temp 97.7°F | Resp 18 | Ht 69.0 in | Wt 152.8 lb

## 2024-08-09 VITALS — BP 157/100 | HR 101

## 2024-08-09 DIAGNOSIS — C679 Malignant neoplasm of bladder, unspecified: Secondary | ICD-10-CM

## 2024-08-09 DIAGNOSIS — E039 Hypothyroidism, unspecified: Secondary | ICD-10-CM | POA: Insufficient documentation

## 2024-08-09 DIAGNOSIS — M7989 Other specified soft tissue disorders: Secondary | ICD-10-CM | POA: Insufficient documentation

## 2024-08-09 DIAGNOSIS — G20A1 Parkinson's disease without dyskinesia, without mention of fluctuations: Secondary | ICD-10-CM | POA: Insufficient documentation

## 2024-08-09 DIAGNOSIS — L299 Pruritus, unspecified: Secondary | ICD-10-CM | POA: Insufficient documentation

## 2024-08-09 DIAGNOSIS — L409 Psoriasis, unspecified: Secondary | ICD-10-CM | POA: Diagnosis not present

## 2024-08-09 DIAGNOSIS — Z8551 Personal history of malignant neoplasm of bladder: Secondary | ICD-10-CM | POA: Insufficient documentation

## 2024-08-09 DIAGNOSIS — Z923 Personal history of irradiation: Secondary | ICD-10-CM | POA: Insufficient documentation

## 2024-08-09 DIAGNOSIS — L304 Erythema intertrigo: Secondary | ICD-10-CM

## 2024-08-09 LAB — CMP (CANCER CENTER ONLY)
ALT: 9 U/L (ref 0–44)
AST: 20 U/L (ref 15–41)
Albumin: 3.8 g/dL (ref 3.5–5.0)
Alkaline Phosphatase: 73 U/L (ref 38–126)
Anion gap: 7 (ref 5–15)
BUN: 17 mg/dL (ref 8–23)
CO2: 29 mmol/L (ref 22–32)
Calcium: 9.7 mg/dL (ref 8.9–10.3)
Chloride: 108 mmol/L (ref 98–111)
Creatinine: 1.59 mg/dL — ABNORMAL HIGH (ref 0.61–1.24)
GFR, Estimated: 42 mL/min — ABNORMAL LOW (ref >60)
Glucose, Bld: 97 mg/dL (ref 70–99)
Potassium: 5.3 mmol/L — ABNORMAL HIGH (ref 3.5–5.1)
Sodium: 144 mmol/L (ref 135–145)
Total Bilirubin: 0.5 mg/dL (ref 0.0–1.2)
Total Protein: 6.8 g/dL (ref 6.5–8.1)

## 2024-08-09 LAB — CBC WITH DIFFERENTIAL (CANCER CENTER ONLY)
Abs Immature Granulocytes: 0.07 K/uL (ref 0.00–0.07)
Basophils Absolute: 0.1 K/uL (ref 0.0–0.1)
Basophils Relative: 1 %
Eosinophils Absolute: 1.8 K/uL — ABNORMAL HIGH (ref 0.0–0.5)
Eosinophils Relative: 18 %
HCT: 41.5 % (ref 39.0–52.0)
Hemoglobin: 13.2 g/dL (ref 13.0–17.0)
Immature Granulocytes: 1 %
Lymphocytes Relative: 14 %
Lymphs Abs: 1.3 K/uL (ref 0.7–4.0)
MCH: 30.6 pg (ref 26.0–34.0)
MCHC: 31.8 g/dL (ref 30.0–36.0)
MCV: 96.1 fL (ref 80.0–100.0)
Monocytes Absolute: 0.7 K/uL (ref 0.1–1.0)
Monocytes Relative: 7 %
Neutro Abs: 5.9 K/uL (ref 1.7–7.7)
Neutrophils Relative %: 59 %
Platelet Count: 310 K/uL (ref 150–400)
RBC: 4.32 MIL/uL (ref 4.22–5.81)
RDW: 12.7 % (ref 11.5–15.5)
WBC Count: 9.8 K/uL (ref 4.0–10.5)
nRBC: 0 % (ref 0.0–0.2)

## 2024-08-09 LAB — PRO BRAIN NATRIURETIC PEPTIDE: Pro Brain Natriuretic Peptide: 348 pg/mL — ABNORMAL HIGH (ref <300.0)

## 2024-08-09 LAB — TSH: TSH: 5.27 u[IU]/mL — ABNORMAL HIGH (ref 0.350–4.500)

## 2024-08-09 MED ORDER — TRIAMCINOLONE ACETONIDE 0.1 % EX OINT: 1.0000 | TOPICAL_OINTMENT | Freq: Two times a day (BID) | CUTANEOUS | 4 refills | Status: DC

## 2024-08-09 MED ORDER — KETOCONAZOLE 2 % EX CREA: 1.0000 | TOPICAL_CREAM | Freq: Two times a day (BID) | CUTANEOUS | 0 refills | Status: AC

## 2024-08-09 MED ORDER — TACROLIMUS 0.1 % EX OINT: TOPICAL_OINTMENT | Freq: Two times a day (BID) | CUTANEOUS | 6 refills | Status: DC

## 2024-08-09 MED ORDER — FLUCONAZOLE 150 MG PO TABS: 150.0000 mg | ORAL_TABLET | Freq: Every day | ORAL | 0 refills | Status: AC

## 2024-08-09 NOTE — Progress Notes (Addendum)
 [facility] OFFICE PROGRESS NOTE   Diagnosis: Bladder cancer  INTERVAL HISTORY:   Anthony Pacheco returns as scheduled.  He reports that he rash has progressed and over the past 3 to 4 days he is experiencing generalized pruritus.  He has recently noticed multiple new lesions on the upper back.  He reports a sore on the right palm and left pinky finger.  No mouth sores.  He has more leg swelling.  He denies fever.  No shortness of breath.  No diarrhea.  No hematuria.  Appetite is stable.  Legs are more swollen.  Objective:  Vital signs in last 24 hours:  Blood pressure 115/63, pulse 100, temperature 97.9 F (36.6 C), temperature source Temporal, resp. rate 18, height 5' 9 (1.753 m), weight 152 lb 12.8 oz (69.3 kg), SpO2 100%.    HEENT: No mouth ulcers. Resp: Lungs clear bilaterally. Cardio: Regular rate and rhythm. GI: No hepatosplenomegaly.  No mass. Vascular: Pitting edema below the knees bilaterally. Neuro: Alert and oriented. Skin: Excoriated appearing rash below the knees.  Erythematous plaque-like lesions scattered over the extremities.  Scattered erythematous nonpustular lesions on the extremities and trunk.   Lab Results:  Lab Results  Component Value Date   WBC 6.3 06/28/2024   HGB 12.5 (L) 06/28/2024   HCT 39.5 06/28/2024   MCV 97.1 06/28/2024   PLT 221 06/28/2024   NEUTROABS 4.3 06/28/2024    Imaging:  No results found.  Medications: I have reviewed the patient's current medications.  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  TURBT 07/18/2018.  Found to have high-grade invasive urothelial carcinoma; with suspicion for invasion into the muscularis propria, T2 lesion with grade 3 histology.  He completed radiation therapy with weekly carboplatin  09/04/2018 through 10/25/2018. Cystoscopy 06/09/2023-nodular lesion left anterior bladder wall, 2 other smaller lesions to the right CTs 07/03/2023-negative for metastatic  disease Cystoscopy/TURBT 07/26/2023-scattered patches of sessile appearing tumors with associated dystrophic calcifications along the left anterior bladder wall and dome; additional small patches of sessile appearing tumor along the posterior bladder wall left greater than right and right anterior bladder wall; entirety of the left anterior sessile tumor was resected, remainder of the tumors were cauterized, pathology-high-grade invasive urothelial carcinoma, muscularis propria not present, lymphovascular invasion not identified, tumor invades at least lamina propria.  PD-L1 CPS 3 Cycle 1 Pembrolizumab  08/29/2023 Cycle 2 Pembrolizumab  09/19/2023 Cycle 3 pembrolizumab  10/11/2023 Cycle 4 pembrolizumab  11/04/2023 Cystoscopy 11/10/2023-bladder with mild trabeculation with lesions.  On the dome of the bladder there is a lesion measuring approximately 4 cm in diameter with some necrotic debris, probably a persistent cancer.  No other lesions noted. Cycle 5 Pembrolizumab  11/25/2023 Cycle 6 pembrolizumab  12/16/2023 CTs 12/30/2023-no evidence of metastatic disease in the chest, abdomen, pelvis Cycle 7 Pembrolizumab  01/06/2024 Cycle 8 Pembrolizumab  01/27/2024 Cycle 9 pembrolizumab  02/17/2024 Cycle 10 Pembrolizumab  03/08/2024 Cystoscopy 03/15/2024 papillary lesion at the bladder dome-less prominent Cycle 11 pembrolizumab  03/29/2024 Cycle 12 pembrolizumab  held 7/24 for skin eruption Cycle 12 Pembrolizumab  resumed 05/14/2024 06/07/2024 treatment held due to new skin lesions 06/21/2024 cystoscopy: Stable 2 cm right bladder dome papillary lesion Recent diagnosis of Parkinson's, followed by Dr. Evonnie Hypothyroid Multiple ulcerated/plaque lesions on exam 04/20/2023.  Biopsy per Dr. Alm confirmed psoriasis.  On clobetasol  twice daily.  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  Progressive rash 08/09/2024-referred to dermatology.    Disposition: Anthony Pacheco presents for routine follow-up.  He was last treated with Pembrolizumab  05/14/2024.   He has a progressive skin rash.  We are contacting dermatology for an urgent appointment.  He has been scheduled to see Dr. Alm this afternoon.  Multiple photos were taken and uploaded to the media section of his chart.    He will return for a follow-up visit in approximately 2 weeks.  We are available to see him sooner if needed.  Patient seen with Dr. Cloretta.  Olam Ned ANP/GNP-BC   08/09/2024  8:13 AM  Mr Pacheco was interviewed and examined.  He has a progressive rash.  We are concerned the rash is related to previous treatment with pembrolizumab .  We referred him to dermatology .  Arvella Cloretta MD

## 2024-08-09 NOTE — Progress Notes (Signed)
 Follow-Up Visit   Subjective  Anthony Pacheco is a 88 y.o. male who presents for the following: Rash  Patient present today for follow up visit for Rash. Patient was last evaluated on 05/14/24. Patient reports this rash started about 2 weeks ago. He reports the rash is excessively itchy. He rates the itch is 10 out of 10 and he is unsure why it started. He is currently applying Clobetasol . He reports he is currently on day 4 of applying clobetasol .  Patient reports sxs are worse. Patient denies medication changes.  The following portions of the chart were reviewed this encounter and updated as appropriate: medications, allergies, medical history  Review of Systems:  No other skin or systemic complaints except as noted in HPI or Assessment and Plan.  Objective  Well appearing patient in no apparent distress; mood and affect are within normal limits.  A full examination was performed including scalp, head, eyes, ears, nose, lips, neck, chest, axillae, abdomen, back, buttocks, bilateral upper extremities, bilateral lower extremities, hands, feet, fingers, toes, fingernails, and toenails. All findings within normal limits unless otherwise noted below.   Relevant exam findings are noted in the Assessment and Plan.    Assessment & Plan   PSORIASIS Exam: Well-demarcated erythematous papules/plaques with silvery scale, guttate pink scaly papules. 80% BSA. IGA: 3 with secondary Stasis Derm with Pitting Edema B/L LE   Flared  Patient reports joint pain  Psoriasis is a chronic non-curable, but treatable genetic/hereditary disease that may have other systemic features affecting other organ systems such as joints (Psoriatic Arthritis). It is associated with an increased risk of inflammatory bowel disease, heart disease, non-alcoholic fatty liver disease, and depression.  Treatments include light and laser treatments; topical medications; and systemic medications including oral and  injectables.  Treatment Plan: - Discontinue Clobetasol  - Prescribed TMC Ointment to apply 2 times daily for 2 weeks then stop and alternate with Tacrolimus  INTERTRIGO Exam: Erythematous macerated patches in body folds  Flares  Intertrigo is a chronic recurrent rash that occurs in skin fold areas that may be associated with friction; heat; moisture; yeast; fungus; and bacteria.  It is exacerbated by increased movement / activity; sweating; and higher atmospheric temperature.  Use of an absorbant powder such as Zeasorb AF powder or other OTC antifungal powder to the area daily can prevent rash recurrence. Other options to help keep the area dry include blow drying the area after bathing or using antiperspirant products such as Duradry sweat minimizing gel.  Treatment Plan: - Prescribed Ketoconazole 2 times dialy to groin until area clears - Prescribed fluconazole 150 mg to take once a day for 3 days - Plan to follow up in 2 months    INTERTRIGO   Related Medications fluconazole (DIFLUCAN) 150 MG tablet Take 1 tablet (150 mg total) by mouth daily. ketoconazole (NIZORAL) 2 % cream Apply 1 Application topically 2 (two) times daily. Apply 2 times daily to the groin until clear PSORIASIS   Related Medications calcipotriene  (DOVONOX) 0.005 % cream Apply topically 2 (two) times daily. Apply 2 times daily for 2 weeks alternate 2 weeks on and 2 weeks off triamcinolone ointment (KENALOG) 0.1 % Apply 1 Application topically 2 (two) times daily. Apply 2 times daily for 2 weeks THEN STOP and take a break for 2 weeks tacrolimus (PROTOPIC) 0.1 % ointment Apply topically 2 (two) times daily. When taking a break from using Triamcinolone, Apply 2 times daily for 2 weeks then STOP  Return in about 2  months (around 10/09/2024) for Psoriasis F/U.  I, Karver Fadden, am acting as neurosurgeon for [employer], DO.  Documentation: I have reviewed the above documentation for accuracy and completeness, and I  agree with the above.  Delon Lenis, DO

## 2024-08-09 NOTE — Patient Instructions (Addendum)
 VISIT SUMMARY:  You came in today because of a worsening rash. You have a history of psoriasis, and the rash has been affecting areas like the back of your arms and both thighs. You also have a history of bladder cancer and were receiving pembrolizumab , which was stopped due to your skin condition. You are currently taking medications for thyroid  issues and Parkinson's disease. You live independently, do your own shopping, and primarily eat out, favoring vegetables and avoiding meat.  YOUR PLAN:  -PSORIASIS EXACERBATION WITH STASIS DERMATITIS AND LOWER EXTREMITY EDEMA:  Psoriasis is a skin condition that causes red, itchy, and scaly patches. Stasis dermatitis is a skin condition that occurs in the lower legs due to poor circulation, often leading to swelling.   You have been prescribed triamcinolone ointment to apply twice daily for two weeks, after which you should switch to tacrolimus ointment.  Keep Alternating until you come back for your follow up  Elevate your legs when sitting to reduce swelling, wear compression stockings daily, and limit your salt intake.   Use Aveeno oatmeal bath packets once a week to soothe your skin.  -INTERTRIGO OF BILATERAL INGUINAL FOLDS:  Intertrigo is a rash that occurs in skin folds due to moisture and friction. You have been prescribed ketoconazole cream to apply daily until the rash resolves.  -BLADDER CANCER, CURRENTLY NOT RECEIVING PEMBROLIZUMAB :  Your bladder cancer treatment with pembrolizumab  has been paused due to severe skin reactions. We will continue to monitor your skin condition and adjust your treatment as necessary.  INSTRUCTIONS:  Please follow up with your oncologist regarding your bladder cancer treatment. Continue to monitor your skin condition and report any changes or worsening symptoms.        Important Information  Due to recent changes in healthcare laws, you may see results of your pathology and/or laboratory studies on  MyChart before the doctors have had a chance to review them. We understand that in some cases there may be results that are confusing or concerning to you. Please understand that not all results are received at the same time and often the doctors may need to interpret multiple results in order to provide you with the best plan of care or course of treatment. Therefore, we ask that you please give us  2 business days to thoroughly review all your results before contacting the office for clarification. Should we see a critical lab result, you will be contacted sooner.   If You Need Anything After Your Visit  If you have any questions or concerns for your doctor, please call our main line at [phone] If no one answers, please leave a voicemail as directed and we will return your call as soon as possible. Messages left after 4 pm will be answered the following business day.   You may also send us  a message via MyChart. We typically respond to MyChart messages within 1-2 business days.  For prescription refills, please ask your pharmacy to contact our office. Our fax number is [phone].  If you have an urgent issue when the clinic is closed that cannot wait until the next business day, you can page your doctor at the number below.    Please note that while we do our best to be available for urgent issues outside of office hours, we are not available 24/7.   If you have an urgent issue and are unable to reach us , you may choose to seek medical care at your doctor's office, retail clinic, urgent care  center, or emergency room.  If you have a medical emergency, please immediately call 911 or go to the emergency department. In the event of inclement weather, please call our main line at [phone] for an update on the status of any delays or closures.  Dermatology Medication Tips: Please keep the boxes that topical medications come in in order to help keep track of the instructions about where  and how to use these. Pharmacies typically print the medication instructions only on the boxes and not directly on the medication tubes.   If your medication is too expensive, please contact our office at [phone] or send us  a message through MyChart.   We are unable to tell what your co-pay for medications will be in advance as this is different depending on your insurance coverage. However, we may be able to find a substitute medication at lower cost or fill out paperwork to get insurance to cover a needed medication.   If a prior authorization is required to get your medication covered by your insurance company, please allow us  1-2 business days to complete this process.  Drug prices often vary depending on where the prescription is filled and some pharmacies may offer cheaper prices.  The website www.goodrx.com contains coupons for medications through different pharmacies. The prices here do not account for what the cost may be with help from insurance (it may be cheaper with your insurance), but the website can give you the price if you did not use any insurance.  - You can print the associated coupon and take it with your prescription to the pharmacy.  - You may also stop by our office during regular business hours and pick up a GoodRx coupon card.  - If you need your prescription sent electronically to a different pharmacy, notify our office through [facility] or by phone at [phone]

## 2024-08-10 ENCOUNTER — Other Ambulatory Visit: Payer: Self-pay | Admitting: Nurse Practitioner

## 2024-08-10 DIAGNOSIS — C679 Malignant neoplasm of bladder, unspecified: Secondary | ICD-10-CM

## 2024-08-12 NOTE — Addendum Note (Signed)
 Addended by: CLORETTA KUBA B on: 08/12/2024 07:16 AM   Modules accepted: Level of Service

## 2024-08-19 ENCOUNTER — Other Ambulatory Visit: Payer: Self-pay | Admitting: Oncology

## 2024-08-22 ENCOUNTER — Encounter: Payer: Self-pay | Admitting: Nurse Practitioner

## 2024-08-22 ENCOUNTER — Inpatient Hospital Stay: Admitting: Nurse Practitioner

## 2024-08-22 ENCOUNTER — Inpatient Hospital Stay

## 2024-08-22 VITALS — BP 136/70 | HR 78 | Temp 97.8°F | Resp 18 | Ht 69.0 in | Wt 149.2 lb

## 2024-08-22 DIAGNOSIS — C679 Malignant neoplasm of bladder, unspecified: Secondary | ICD-10-CM | POA: Diagnosis not present

## 2024-08-22 DIAGNOSIS — L299 Pruritus, unspecified: Secondary | ICD-10-CM | POA: Diagnosis not present

## 2024-08-22 LAB — CBC WITH DIFFERENTIAL (CANCER CENTER ONLY)
Abs Immature Granulocytes: 0.05 K/uL (ref 0.00–0.07)
Basophils Absolute: 0.1 K/uL (ref 0.0–0.1)
Basophils Relative: 1 %
Eosinophils Absolute: 1.5 K/uL — ABNORMAL HIGH (ref 0.0–0.5)
Eosinophils Relative: 18 %
HCT: 39.9 % (ref 39.0–52.0)
Hemoglobin: 12.8 g/dL — ABNORMAL LOW (ref 13.0–17.0)
Immature Granulocytes: 1 %
Lymphocytes Relative: 12 %
Lymphs Abs: 1 K/uL (ref 0.7–4.0)
MCH: 31.2 pg (ref 26.0–34.0)
MCHC: 32.1 g/dL (ref 30.0–36.0)
MCV: 97.3 fL (ref 80.0–100.0)
Monocytes Absolute: 0.6 K/uL (ref 0.1–1.0)
Monocytes Relative: 7 %
Neutro Abs: 5.2 K/uL (ref 1.7–7.7)
Neutrophils Relative %: 61 %
Platelet Count: 222 K/uL (ref 150–400)
RBC: 4.1 MIL/uL — ABNORMAL LOW (ref 4.22–5.81)
RDW: 13.4 % (ref 11.5–15.5)
WBC Count: 8.5 K/uL (ref 4.0–10.5)
nRBC: 0 % (ref 0.0–0.2)

## 2024-08-22 LAB — CMP (CANCER CENTER ONLY)
ALT: <5 U/L (ref 0–44)
AST: 20 U/L (ref 15–41)
Albumin: 4.1 g/dL (ref 3.5–5.0)
Alkaline Phosphatase: 68 U/L (ref 38–126)
Anion gap: 8 (ref 5–15)
BUN: 18 mg/dL (ref 8–23)
CO2: 27 mmol/L (ref 22–32)
Calcium: 9.7 mg/dL (ref 8.9–10.3)
Chloride: 107 mmol/L (ref 98–111)
Creatinine: 1.52 mg/dL — ABNORMAL HIGH (ref 0.61–1.24)
GFR, Estimated: 44 mL/min — ABNORMAL LOW (ref >60)
Glucose, Bld: 99 mg/dL (ref 70–99)
Potassium: 4.8 mmol/L (ref 3.5–5.1)
Sodium: 142 mmol/L (ref 135–145)
Total Bilirubin: 0.5 mg/dL (ref 0.0–1.2)
Total Protein: 7 g/dL (ref 6.5–8.1)

## 2024-08-22 MED ORDER — METHYLPREDNISOLONE 4 MG PO TBPK: ORAL_TABLET | ORAL | 0 refills | Status: DC

## 2024-08-22 NOTE — Progress Notes (Signed)
 [facility] OFFICE PROGRESS NOTE   Diagnosis: Bladder cancer  INTERVAL HISTORY:   Anthony Pacheco returns as scheduled.  He saw Dr. Alm 08/09/2024.  He was prescribed ketoconazole  and fluconazole .  Overall he notes the sores are improved.  His main complaint continues to be significant pruritus involving multiple areas.  He has a single dry lesion on the right palm which he feels is better.  He has noted no new lesions.  No mouth sores.  No intranasal sores.  For the pruritus he applies triamcinolone  to small pruritic areas with improvement.  Objective:  Vital signs in last 24 hours:  Blood pressure 136/70, pulse 78, temperature 97.8 F (36.6 C), temperature source Temporal, resp. rate 18, height 5' 9 (1.753 m), weight 149 lb 3.2 oz (67.7 kg), SpO2 97%.    HEENT: No thrush or ulcers. Resp: Lungs clear bilaterally. Cardio: Regular rate and rhythm. GI: No hepatosplenomegaly. Vascular: Trace lower leg edema bilaterally. Skin: The rash in general appears improved.  Lower legs with mild erythema, the skin no longer appears excoriated.  Previously noted plaque-like lesions at the upper legs, arms, right upper back appear to be healing.  No pustular lesions noted.   Lab Results:  Lab Results  Component Value Date   WBC 8.5 08/22/2024   HGB 12.8 (L) 08/22/2024   HCT 39.9 08/22/2024   MCV 97.3 08/22/2024   PLT 222 08/22/2024   NEUTROABS 5.2 08/22/2024    Imaging:  No results found.  Medications: I have reviewed the patient's current medications.  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  TURBT 07/18/2018.  Found to have high-grade invasive urothelial carcinoma; with suspicion for invasion into the muscularis propria, T2 lesion with grade 3 histology.  He completed radiation therapy with weekly carboplatin  09/04/2018 through 10/25/2018. Cystoscopy 06/09/2023-nodular lesion left anterior bladder wall, 2 other smaller lesions to the  right CTs 07/03/2023-negative for metastatic disease Cystoscopy/TURBT 07/26/2023-scattered patches of sessile appearing tumors with associated dystrophic calcifications along the left anterior bladder wall and dome; additional small patches of sessile appearing tumor along the posterior bladder wall left greater than right and right anterior bladder wall; entirety of the left anterior sessile tumor was resected, remainder of the tumors were cauterized, pathology-high-grade invasive urothelial carcinoma, muscularis propria not present, lymphovascular invasion not identified, tumor invades at least lamina propria.  PD-L1 CPS 3 Cycle 1 Pembrolizumab  08/29/2023 Cycle 2 Pembrolizumab  09/19/2023 Cycle 3 pembrolizumab  10/11/2023 Cycle 4 pembrolizumab  11/04/2023 Cystoscopy 11/10/2023-bladder with mild trabeculation with lesions.  On the dome of the bladder there is a lesion measuring approximately 4 cm in diameter with some necrotic debris, probably a persistent cancer.  No other lesions noted. Cycle 5 Pembrolizumab  11/25/2023 Cycle 6 pembrolizumab  12/16/2023 CTs 12/30/2023-no evidence of metastatic disease in the chest, abdomen, pelvis Cycle 7 Pembrolizumab  01/06/2024 Cycle 8 Pembrolizumab  01/27/2024 Cycle 9 pembrolizumab  02/17/2024 Cycle 10 Pembrolizumab  03/08/2024 Cystoscopy 03/15/2024 papillary lesion at the bladder dome-less prominent Cycle 11 pembrolizumab  03/29/2024 Cycle 12 pembrolizumab  held 7/24 for skin eruption Cycle 12 Pembrolizumab  resumed 05/14/2024 06/07/2024 treatment held due to new skin lesions 06/21/2024 cystoscopy: Stable 2 cm right bladder dome papillary lesion Recent diagnosis of Parkinson's, followed by Dr. Evonnie Hypothyroid Multiple ulcerated/plaque lesions on exam 04/20/2023.  Biopsy per Dr. Alm confirmed psoriasis.  On clobetasol  twice daily.  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  Progressive rash 08/09/2024-referred to dermatology.  Disposition: Mr. Manke appears stable.  The skin rash is better.   Main issue is pruritus involving multiple  areas.  He notes improvement in pruritus when he applies a topical steroid.  We discussed a trial of oral steroids due to multiple areas of involvement.  He is in agreement.  He will complete a Medrol  Dosepak.  He will contact the office if there is no improvement.  Otherwise we will plan to see him back in 2 weeks to reevaluate.  Plan reviewed with Dr. Cloretta.    Anthony Pacheco ANP/GNP-BC   08/22/2024  10:54 AM

## 2024-08-28 ENCOUNTER — Telehealth: Payer: Self-pay

## 2024-08-28 NOTE — Telephone Encounter (Signed)
-----   Message from Nurse Garen HERO sent at 08/27/2024  3:26 PM EST ----- Regarding: RE: steroid working The rash is a little better. The rash is dry up.Everything is okay. ----- Message ----- From: Marikay Garen RAMAN, LPN Sent: [id number]/04/7973  12:00 AM EST To: Background Patient List Reminders Subject: steroid working

## 2024-09-05 ENCOUNTER — Inpatient Hospital Stay: Attending: Nurse Practitioner | Admitting: Nurse Practitioner

## 2024-09-05 ENCOUNTER — Encounter: Payer: Self-pay | Admitting: Nurse Practitioner

## 2024-09-05 VITALS — BP 115/58 | HR 90 | Temp 97.8°F | Resp 18 | Ht 69.0 in | Wt 145.3 lb

## 2024-09-05 DIAGNOSIS — Z8551 Personal history of malignant neoplasm of bladder: Secondary | ICD-10-CM | POA: Diagnosis present

## 2024-09-05 DIAGNOSIS — E039 Hypothyroidism, unspecified: Secondary | ICD-10-CM | POA: Diagnosis not present

## 2024-09-05 DIAGNOSIS — Z923 Personal history of irradiation: Secondary | ICD-10-CM | POA: Diagnosis not present

## 2024-09-05 DIAGNOSIS — Z9221 Personal history of antineoplastic chemotherapy: Secondary | ICD-10-CM | POA: Diagnosis not present

## 2024-09-05 DIAGNOSIS — G20A1 Parkinson's disease without dyskinesia, without mention of fluctuations: Secondary | ICD-10-CM | POA: Diagnosis not present

## 2024-09-05 DIAGNOSIS — C679 Malignant neoplasm of bladder, unspecified: Secondary | ICD-10-CM

## 2024-09-05 DIAGNOSIS — R8290 Unspecified abnormal findings in urine: Secondary | ICD-10-CM | POA: Insufficient documentation

## 2024-09-05 DIAGNOSIS — R21 Rash and other nonspecific skin eruption: Secondary | ICD-10-CM | POA: Diagnosis not present

## 2024-09-05 NOTE — Progress Notes (Signed)
 [facility] OFFICE PROGRESS NOTE   Diagnosis: Bladder cancer  INTERVAL HISTORY:   Anthony Pacheco returns as scheduled.  Skin rash is better.  He continues to have pruritus.  He noted no improvement in the pruritus with the steroid Dosepak.  He does note improvement in pruritus when he applies topical creams as prescribed by Dr. Alm.  No mouth sores.  No new lesions.  Objective:  Vital signs in last 24 hours:  Blood pressure (!) 115/58, pulse 90, temperature 97.8 F (36.6 C), temperature source Temporal, resp. rate 18, height 5' 9 (1.753 m), weight 145 lb 4.8 oz (65.9 kg), SpO2 100%.    HEENT: No thrush or ulcers. Resp: Lungs clear bilaterally. Cardio: Regular rate and rhythm. GI: No hepatosplenomegaly. Vascular: Trace lower leg edema bilaterally. Neuro: Alert and oriented. Skin: In general skin appears improved.  Continued scattered plaque-like lesions on the legs and arms, appear to be healing.  A few macular type lesions scattered over the abdominal wall.  No pustular lesions noted.  Single small lesion left palm, stable.   Lab Results:  Lab Results  Component Value Date   WBC 8.5 08/22/2024   HGB 12.8 (L) 08/22/2024   HCT 39.9 08/22/2024   MCV 97.3 08/22/2024   PLT 222 08/22/2024   NEUTROABS 5.2 08/22/2024    Imaging:  No results found.  Medications: I have reviewed the patient's current medications.  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  TURBT 07/18/2018.  Found to have high-grade invasive urothelial carcinoma; with suspicion for invasion into the muscularis propria, T2 lesion with grade 3 histology.  He completed radiation therapy with weekly carboplatin  09/04/2018 through 10/25/2018. Cystoscopy 06/09/2023-nodular lesion left anterior bladder wall, 2 other smaller lesions to the right CTs 07/03/2023-negative for metastatic disease Cystoscopy/TURBT 07/26/2023-scattered patches of sessile appearing tumors with associated  dystrophic calcifications along the left anterior bladder wall and dome; additional small patches of sessile appearing tumor along the posterior bladder wall left greater than right and right anterior bladder wall; entirety of the left anterior sessile tumor was resected, remainder of the tumors were cauterized, pathology-high-grade invasive urothelial carcinoma, muscularis propria not present, lymphovascular invasion not identified, tumor invades at least lamina propria.  PD-L1 CPS 3 Cycle 1 Pembrolizumab  08/29/2023 Cycle 2 Pembrolizumab  09/19/2023 Cycle 3 pembrolizumab  10/11/2023 Cycle 4 pembrolizumab  11/04/2023 Cystoscopy 11/10/2023-bladder with mild trabeculation with lesions.  On the dome of the bladder there is a lesion measuring approximately 4 cm in diameter with some necrotic debris, probably a persistent cancer.  No other lesions noted. Cycle 5 Pembrolizumab  11/25/2023 Cycle 6 pembrolizumab  12/16/2023 CTs 12/30/2023-no evidence of metastatic disease in the chest, abdomen, pelvis Cycle 7 Pembrolizumab  01/06/2024 Cycle 8 Pembrolizumab  01/27/2024 Cycle 9 pembrolizumab  02/17/2024 Cycle 10 Pembrolizumab  03/08/2024 Cystoscopy 03/15/2024 papillary lesion at the bladder dome-less prominent Cycle 11 pembrolizumab  03/29/2024 Cycle 12 pembrolizumab  held 7/24 for skin eruption Cycle 12 Pembrolizumab  resumed 05/14/2024 06/07/2024 treatment held due to new skin lesions 06/21/2024 cystoscopy: Stable 2 cm right bladder dome papillary lesion Recent diagnosis of Parkinson's, followed by Dr. Evonnie Hypothyroid Multiple ulcerated/plaque lesions on exam 04/20/2023.  Biopsy per Dr. Alm confirmed psoriasis.  On clobetasol  twice daily.  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  Progressive rash 08/09/2024-referred to dermatology.  Disposition: Anthony Pacheco appears stable.  The skin rash is improving.  He will continue topical treatment as recommended by Dr. Alm.  He will return for follow-up in 3 weeks.  We are available to see him  sooner if  needed.  Plan reviewed with Dr. Cloretta.    Anthony Pacheco ANP/GNP-BC   09/05/2024  9:29 AM

## 2024-09-14 NOTE — Progress Notes (Signed)
 "   Assessment/Plan:   1.  Parkinsons Disease, diagnosed November, 2021, slightly worsened  -continue carbidopa /levodopa  25/100, 2 po tid  -We discussed that it used to be thought that levodopa  would increase risk of melanoma but now it is believed that Parkinsons itself likely increases risk of melanoma. he is to get regular skin checks.  He is doing that   2.  Bladder cancer  -recent chemotherapy held due to skin lesions.    3.  Hypothyroidism, currently effectively overtreated  - Primary care is decreasing his levothyroxine.  Discussed that this can contribute to tremor  4.  Psoriasis  - Following with dermatology  5.  Weight loss  -due to #2 and complications from chemo  -discussed increasing protein and avoiding levodopa   Subjective:   Anthony Pacheco was seen today in follow up for Parkinson's disease.  Patients levodopa  was increased last visit to 2 tablet 3 times per day.  Patient is tolerating that well, without side effects.  Patient is being treated for psoriasis by dermatology and notes are reviewed.  Skin has been itchy because of this.  He also has sores on the legs from chemo, which is being held currently.  Having weight loss.  Appetite is poor.  Current prescribed movement disorder medications: Carbidopa /levodopa  25/100, 2 tablets 3 times a day   PREVIOUS MEDICATIONS: Sinemet   ALLERGIES:  No Known Allergies   Objective:   PHYSICAL EXAMINATION:    VITALS:   Vitals:   09/25/24 0836  BP: 122/76  Pulse: 70  SpO2: 99%  Weight: 146 lb 12.8 oz (66.6 kg)   Wt Readings from Last 3 Encounters:  09/25/24 146 lb 12.8 oz (66.6 kg)  09/05/24 145 lb 4.8 oz (65.9 kg)  08/22/24 149 lb 3.2 oz (67.7 kg)    GEN:  The patient appears stated age and is in NAD. HEENT:  Normocephalic, atraumatic.  The mucous membranes are moist. The superficial temporal arteries are without ropiness or tenderness. CV:  RRR Lungs:  CTAB Neck/HEME:  There are no carotid bruits  bilaterally.  Neurological examination:  Orientation: The patient is alert and oriented x3. Cranial nerves: There is good facial symmetry with min facial hypomimia. The speech is fluent and clear. Soft palate rises symmetrically and there is no tongue deviation. Hearing is intact to conversational tone. Sensation: Sensation is intact to light touch throughout Motor: Strength is at least antigravity x4.  Movement examination: Tone: There is nl tone in the UE/LE Abnormal movements: there is LUE rest tremor that increases with distraction.  this is stable Coordination:  There is decremation with any form of RAMS, including alternating supination and pronation of the forearm, hand opening and closing, finger taps on the L.  RAMs on the R and in the bilateral LE are good Gait and Station: The patient has no difficulty arising out of a deep-seated chair without the use of the hands. The patient ambulates well today with slightly decreased arm swing.  He is just slightly forward flexed.  This is stable.   I have reviewed and interpreted the following labs independently    Chemistry      Component Value Date/Time   NA 142 08/22/2024 1005   K 4.8 08/22/2024 1005   CL 107 08/22/2024 1005   CO2 27 08/22/2024 1005   BUN 18 08/22/2024 1005   CREATININE 1.52 (H) 08/22/2024 1005      Component Value Date/Time   CALCIUM 9.7 08/22/2024 1005   ALKPHOS 68 08/22/2024 1005  AST 20 08/22/2024 1005   ALT <5 08/22/2024 1005   BILITOT 0.5 08/22/2024 1005       Lab Results  Component Value Date   WBC 8.5 08/22/2024   HGB 12.8 (L) 08/22/2024   HCT 39.9 08/22/2024   MCV 97.3 08/22/2024   PLT 222 08/22/2024    Lab Results  Component Value Date   TSH 5.270 (H) 08/09/2024     Cc:  Leonel Cole, MD "

## 2024-09-25 ENCOUNTER — Encounter: Payer: Self-pay | Admitting: Neurology

## 2024-09-25 ENCOUNTER — Ambulatory Visit: Admitting: Neurology

## 2024-09-25 VITALS — BP 122/76 | HR 70 | Wt 146.8 lb

## 2024-09-25 DIAGNOSIS — G20A1 Parkinson's disease without dyskinesia, without mention of fluctuations: Secondary | ICD-10-CM

## 2024-09-25 DIAGNOSIS — C673 Malignant neoplasm of anterior wall of bladder: Secondary | ICD-10-CM | POA: Diagnosis not present

## 2024-09-25 NOTE — Patient Instructions (Signed)
" °  VISIT SUMMARY: You came in today for a follow-up on your skin condition and Parkinson's disease management. Your sores on the back of your legs are improving but still present, and your chemotherapy for bladder cancer has been temporarily held because of these sores. You also reported a slight worsening of your Parkinson's symptoms and a decrease in appetite and weight loss.  YOUR PLAN: -PARKINSON'S DISEASE: Parkinson's disease is a disorder of the nervous system that affects movement. Your symptoms have slightly worsened, but your current medication, carbidopa -levodopa , remains effective. You should continue taking carbidopa -levodopa  25/100 mg, two tablets three times a day at 8:00 AM, 12:30 PM, and 5:30 PM. To help with weight maintenance and wound healing, try to increase your protein intake, but avoid eating protein 30 minutes before taking your medication.  INSTRUCTIONS: Please continue to follow up with your dermatologist as scheduled in January. If you notice any significant changes in your symptoms or have any concerns, contact our office.                      Contains text generated by [employer].                                 Contains text generated by [employer].   "

## 2024-09-26 ENCOUNTER — Encounter: Payer: Self-pay | Admitting: Nurse Practitioner

## 2024-09-26 ENCOUNTER — Inpatient Hospital Stay: Admitting: Nurse Practitioner

## 2024-09-26 ENCOUNTER — Other Ambulatory Visit: Payer: Self-pay

## 2024-09-26 ENCOUNTER — Telehealth: Payer: Self-pay | Admitting: Oncology

## 2024-09-26 ENCOUNTER — Inpatient Hospital Stay

## 2024-09-26 VITALS — BP 125/74 | HR 104 | Temp 98.1°F | Resp 18 | Ht 69.0 in | Wt 145.4 lb

## 2024-09-26 DIAGNOSIS — L299 Pruritus, unspecified: Secondary | ICD-10-CM

## 2024-09-26 DIAGNOSIS — N390 Urinary tract infection, site not specified: Secondary | ICD-10-CM | POA: Diagnosis not present

## 2024-09-26 DIAGNOSIS — R319 Hematuria, unspecified: Secondary | ICD-10-CM | POA: Diagnosis not present

## 2024-09-26 DIAGNOSIS — C679 Malignant neoplasm of bladder, unspecified: Secondary | ICD-10-CM | POA: Diagnosis not present

## 2024-09-26 DIAGNOSIS — Z8551 Personal history of malignant neoplasm of bladder: Secondary | ICD-10-CM | POA: Diagnosis not present

## 2024-09-26 LAB — URINALYSIS, COMPLETE (UACMP) WITH MICROSCOPIC
Glucose, UA: NEGATIVE mg/dL
Ketones, ur: 15 mg/dL — AB
Nitrite: NEGATIVE
Protein, ur: 30 mg/dL — AB
RBC / HPF: >50 RBC/hpf (ref 0–5)
Specific Gravity, Urine: >1.03 — ABNORMAL HIGH (ref 1.005–1.030)
WBC, UA: >50 WBC/hpf (ref 0–5)
pH: 5.5 (ref 5.0–8.0)

## 2024-09-26 LAB — CMP (CANCER CENTER ONLY)
ALT: <5 U/L (ref 0–44)
AST: 37 U/L (ref 15–41)
Albumin: 4.3 g/dL (ref 3.5–5.0)
Alkaline Phosphatase: 84 U/L (ref 38–126)
Anion gap: 13 (ref 5–15)
BUN: 16 mg/dL (ref 8–23)
CO2: 25 mmol/L (ref 22–32)
Calcium: 9.9 mg/dL (ref 8.9–10.3)
Chloride: 107 mmol/L (ref 98–111)
Creatinine: 1.7 mg/dL — ABNORMAL HIGH (ref 0.61–1.24)
GFR, Estimated: 39 mL/min — ABNORMAL LOW
Glucose, Bld: 112 mg/dL — ABNORMAL HIGH (ref 70–99)
Potassium: 4.4 mmol/L (ref 3.5–5.1)
Sodium: 145 mmol/L (ref 135–145)
Total Bilirubin: 0.5 mg/dL (ref 0.0–1.2)
Total Protein: 7.3 g/dL (ref 6.5–8.1)

## 2024-09-26 LAB — CBC WITH DIFFERENTIAL (CANCER CENTER ONLY)
Abs Immature Granulocytes: 0.2 K/uL — ABNORMAL HIGH (ref 0.00–0.07)
Basophils Absolute: 0.1 K/uL (ref 0.0–0.1)
Basophils Relative: 1 %
Eosinophils Absolute: 1.6 K/uL — ABNORMAL HIGH (ref 0.0–0.5)
Eosinophils Relative: 12 %
HCT: 44.6 % (ref 39.0–52.0)
Hemoglobin: 14 g/dL (ref 13.0–17.0)
Immature Granulocytes: 2 %
Lymphocytes Relative: 7 %
Lymphs Abs: 1 K/uL (ref 0.7–4.0)
MCH: 30.6 pg (ref 26.0–34.0)
MCHC: 31.4 g/dL (ref 30.0–36.0)
MCV: 97.4 fL (ref 80.0–100.0)
Monocytes Absolute: 0.7 K/uL (ref 0.1–1.0)
Monocytes Relative: 5 %
Neutro Abs: 9.9 K/uL — ABNORMAL HIGH (ref 1.7–7.7)
Neutrophils Relative %: 73 %
Platelet Count: 310 K/uL (ref 150–400)
RBC: 4.58 MIL/uL (ref 4.22–5.81)
RDW: 13.2 % (ref 11.5–15.5)
WBC Count: 13.4 K/uL — ABNORMAL HIGH (ref 4.0–10.5)
nRBC: 0 % (ref 0.0–0.2)

## 2024-09-26 MED ORDER — PREDNISONE 20 MG PO TABS: 60.0000 mg | ORAL_TABLET | Freq: Every day | ORAL | 0 refills | Status: AC

## 2024-09-26 MED ORDER — SULFAMETHOXAZOLE-TRIMETHOPRIM 800-160 MG PO TABS: 1.0000 | ORAL_TABLET | Freq: Two times a day (BID) | ORAL | 0 refills | Status: AC

## 2024-09-26 NOTE — Telephone Encounter (Signed)
 Called and left voicemail on PT phone regarding appt for 12/31.

## 2024-09-26 NOTE — Progress Notes (Signed)
 " [facility] OFFICE PROGRESS NOTE   Diagnosis:  Bladder cancer  INTERVAL HISTORY:   Anthony Pacheco returns as scheduled.  He reports continued severe generalized pruritus.  He has tried multiple topical agents with no improvement.  He reports having chills intermittently since yesterday.  No fever.  No urinary symptoms.  No cough or shortness of breath.  Objective:  Vital signs in last 24 hours:  Blood pressure 127/63, pulse 100, temperature 97.8 F (36.6 C), temperature source Temporal, resp. rate 18, height 5' 9 (1.753 m), weight 145 lb 6.4 oz (66 kg), SpO2 100%.    HEENT: No mouth sores. Resp: Lungs clear bilaterally. Cardio: Regular rate and rhythm. GI: No hepatosplenomegaly. Vascular: Trace pitting edema lower leg bilaterally. Neuro: Alert and oriented. Skin: Skin in general has a dry appearance.  Scattered areas of erythema with associated excoriated skin lesions.  Large dry excoriated areas across the upper back, left lower anterior thigh region.  Smaller similar lesion at the left lower leg.   Lab Results:  Lab Results  Component Value Date   WBC 8.5 08/22/2024   HGB 12.8 (L) 08/22/2024   HCT 39.9 08/22/2024   MCV 97.3 08/22/2024   PLT 222 08/22/2024   NEUTROABS 5.2 08/22/2024    Imaging:  No results found.  Medications: I have reviewed the patient's current medications.  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  TURBT 07/18/2018.  Found to have high-grade invasive urothelial carcinoma; with suspicion for invasion into the muscularis propria, T2 lesion with grade 3 histology.  He completed radiation therapy with weekly carboplatin  09/04/2018 through 10/25/2018. Cystoscopy 06/09/2023-nodular lesion left anterior bladder wall, 2 other smaller lesions to the right CTs 07/03/2023-negative for metastatic disease Cystoscopy/TURBT 07/26/2023-scattered patches of sessile appearing tumors with associated dystrophic calcifications  along the left anterior bladder wall and dome; additional small patches of sessile appearing tumor along the posterior bladder wall left greater than right and right anterior bladder wall; entirety of the left anterior sessile tumor was resected, remainder of the tumors were cauterized, pathology-high-grade invasive urothelial carcinoma, muscularis propria not present, lymphovascular invasion not identified, tumor invades at least lamina propria.  PD-L1 CPS 3 Cycle 1 Pembrolizumab  08/29/2023 Cycle 2 Pembrolizumab  09/19/2023 Cycle 3 pembrolizumab  10/11/2023 Cycle 4 pembrolizumab  11/04/2023 Cystoscopy 11/10/2023-bladder with mild trabeculation with lesions.  On the dome of the bladder there is a lesion measuring approximately 4 cm in diameter with some necrotic debris, probably a persistent cancer.  No other lesions noted. Cycle 5 Pembrolizumab  11/25/2023 Cycle 6 pembrolizumab  12/16/2023 CTs 12/30/2023-no evidence of metastatic disease in the chest, abdomen, pelvis Cycle 7 Pembrolizumab  01/06/2024 Cycle 8 Pembrolizumab  01/27/2024 Cycle 9 pembrolizumab  02/17/2024 Cycle 10 Pembrolizumab  03/08/2024 Cystoscopy 03/15/2024 papillary lesion at the bladder dome-less prominent Cycle 11 pembrolizumab  03/29/2024 Cycle 12 pembrolizumab  held 7/24 for skin eruption Cycle 12 Pembrolizumab  resumed 05/14/2024 06/07/2024 treatment held due to new skin lesions 06/21/2024 cystoscopy: Stable 2 cm right bladder dome papillary lesion Recent diagnosis of Parkinson's, followed by Dr. Evonnie Hypothyroid Multiple ulcerated/plaque lesions on exam 04/20/2023.  Biopsy per Dr. Alm confirmed psoriasis.  On clobetasol  twice daily.  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  Progressive rash 08/09/2024-referred to dermatology.  Disposition: Anthony Pacheco appears stable.  He continues to have significant pruritus with multiple ulcerated plaque-like lesions.  He understands the concern for skin toxicity related to previous immunotherapy.  He will begin prednisone.   We reviewed potential side effects.  He agrees with this plan.  He has had  mild chills over the past day or so.  No associated fever.  No symptoms to suggest an infection source.  No obvious infected skin lesions.  Urinalysis shows increased white cells and red cells.  He will begin a 7-day course of Septra  DS.  He understands to seek evaluation with high fever, shaking chills.  He will return for follow-up on 10/09/2024.  He will contact the office in the interim with any problems.  Patient seen with Dr. Cloretta.    Olam Ned ANP/GNP-BC   09/26/2024  12:19 PM  This was a shared visit with Olam Ned.  Anthony Pacheco was interviewed and examined.  He has a progressive pruritic rash.  He has multiple abrasions from scratching.  He will begin a trial of higher dose prednisone.  He appears to have skin toxicity related to pembrolizumab .  He reports chills for the past day and was noted to have chills in the office today.  No fever.  He does not appear septic.  He will begin a course of antibiotics for urinary tract infection.  Arvella Cloretta, MD      "

## 2024-09-28 ENCOUNTER — Other Ambulatory Visit: Payer: Self-pay | Admitting: Nurse Practitioner

## 2024-09-28 ENCOUNTER — Telehealth: Payer: Self-pay | Admitting: Nurse Practitioner

## 2024-09-28 DIAGNOSIS — C679 Malignant neoplasm of bladder, unspecified: Secondary | ICD-10-CM

## 2024-09-28 DIAGNOSIS — N390 Urinary tract infection, site not specified: Secondary | ICD-10-CM

## 2024-09-28 MED ORDER — AMOXICILLIN 500 MG PO CAPS: 500.0000 mg | ORAL_CAPSULE | Freq: Two times a day (BID) | ORAL | 0 refills | Status: AC

## 2024-09-28 NOTE — Telephone Encounter (Signed)
 Patient has been scheduled for follow-up visit per 09/26/2024 LOS.  LVM notifying pt of appt details, provided my direct number to pt if appt changes need to be made.

## 2024-09-28 NOTE — Telephone Encounter (Signed)
 Called Mr. Kershaw to f/u on his chills, fever, itching since starting the prednisone. He reports no improvement in itching and still having chills. Denies fever. Informed him that urine culture returned positive with aerococcus species and ID pharmacist recommends changing to Amoxicillin 500 mg bid x 7 days and stop the Bactrim . Script has already been sent to [facility]. He agrees to plan and will pick up medication now. Will try to see if dermatology can get him in before the 1/21 visit.

## 2024-10-01 LAB — CULTURE, BLOOD (SINGLE)
Culture: NO GROWTH
Special Requests: ADEQUATE

## 2024-10-03 NOTE — Progress Notes (Signed)
 Patient ID:  Anthony Pacheco is a 89 y.o.  07/03/1936   male     Chief Complaint  Patient presents with   Itching    Patient states he has sores on this legs and back, states they happened a few months ago. Has been to the dermatoglist and got a cream to rub on the sores per patient; however, he states it does not help. No meds on board.    History of Present Illness This is an 89 year old male with a history of malignant neoplasm of the urinary bladder presenting with generalized itching and sores on his legs.  The patient reports that the itching began approximately 2 to 2.5 months ago, potentially due to exposure while working in his backyard. The itching is most severe on his back and also affects his arms. He has not received any injections or oral medications for this condition. He has been applying a cream, possibly triamcinolone , twice daily and takes warm showers. He has not been using CeraVe lotion after showering.  The patient has developed sores on both legs, which have been present for the same duration as the itching. The sores initially appeared on his lower legs and have since spread upwards. Despite treatment with the prescribed cream, the sores have not reduced in size. He has a follow-up appointment with the dermatologist scheduled for next week.  Additionally, the patient reports swelling in his feet without associated pain.  He has missed the last four treatments for his bladder cancer due to the sores.  Patient presents to clinic with a skin rash present for for >2 months History of a similar rash: no Inciting factor(s): unknown Onset: Distribution: Generalized to extremities and back Appearance: Spread (and speed):  Aggravating factors:  Alleviating factors: Systemic symptoms present: NONE History: No recent travel abroad. No insect bite. No new drug exposure. No compromised immunologic status.  No personal or family medical history of skin cancer No fever,  pain , headache, or night sweats or arthralgias No shortness of breath, AMS, V/D No oral lesions, genitalia lesions No lesions on palms/soles or feet No petechia, purpura or other bleeding No recent changes in medication   Review of Systems: All others reviewed and negative except as listed above. Review of Systems  Review of systems is otherwise negative except as noted in the HPI and Assessment/MDM  Past Medical History, Past surgery History, Allergies, Social history, and Family History were reviewed and updated.    PHYSICAL EXAM: BP 147/74   Pulse 98   Temp 97.7 F (36.5 C) (Oral)   Resp 18   Ht 1.829 m (6')   Wt 68.6 kg (151 lb 3.2 oz)   SpO2 100%   BMI 20.51 kg/m   Constitutional:   Well-nourished well-developed in no apparent distress Neuro:  Alert and oriented x3 Psych:  Affect is normal Head:  Normocephalic, atraumatic Neck: Full range of motion, no lymphadenopathy CV: Regular rate. Cap refill < 2 sec. bilateral lower extremity edema 2+ pitting RESP: No audible wheeze.  No conversational dyspnea. Skin: Warm, dry. No widespread rash; no ocular or genitalia involvement. - Location: Upper, lower extremities, back - Morphology: Erythematous plaques with silvery scales, annular, blanchable and erythematous lesions bilateral lower extremities   New Medications Ordered This Visit  Medications   dexAMETHasone  (DECADRON ) injection 10 mg     1. Chronic pruritic rash in adult  dexAMETHasone  (DECADRON ) injection 10 mg    2. History of psoriasis  dexAMETHasone  (DECADRON ) injection 10 mg  3. Personal history of bladder cancer        Assessment & Plan Assessment & Plan Initial Assessment: 89 year old male presents with generalized pruritus and sores on legs, back, and arms. Symptoms started approximately 2-2.5 months ago. History of intertrigo, psoriasis, and malignant neoplasm of the urinary bladder.  Differential Diagnosis: - Psoriasis: Considered due to  plaques and itching. Plan: Administer Decadron  steroid injection, recommend CeraVe anti-itch lotion post-shower, continue triamcinolone  twice daily. - Eczema: Considered due to itching and rash on some extensor surfaces, including elbows.  Less likely given known diagnosis. - Contact dermatitis: Flaky scab lesions, pruritus.  No new lotions, soaps or detergents or known contact with poison ivy, poison oak or sumac. - Stasis dermatitis: Known diagnosis.  Bilateral lower extremity edema with blanchable, erythematous lesions.  UC Course: - Evaluation of pruritic rash. - Reviewed dermatology office visit notes from August into November encounters. - Administered Decadron  10 mg IM.  Final Assessment: Decadron  steroid injection administered to alleviate itching. Recommended CeraVe anti-itch lotion post-shower. Patient advised to inform dermatologist about urgent care visit during upcoming appointment.  Clinical Impression: - Generalized pruritus - Psoriasis - Stasis dermatitis - Malignant neoplasm of the urinary bladder  Disposition: - Follow-Up: Dermatology appointment next week  Patient Education: Recommended CeraVe anti-itch lotion post-shower to maintain skin moisture and reduce itching.    DDX: Viral, bacterial, fungal , drug reaction, autoimmune and metastatic etiologies were considered, including, scabies, viral exanthem, molluscum, hand-foot-mouth disease, varicella, herpes simplex, shingles, tinea, intertrigo, contact dermatitis, eczema, psoriasis, seborrheic dermatitis, impetigo, measles, mumps, rubella, parovirus, roseola, secondary syphilis, hidradenitis suppurativa   When considering the history and appearance of current rash or lesion(s), I believe there is low risk for Cellulitis, Erysipelas, Necrotizing Fasciitis, Melanoma, Disseminated Intravascular Coagulation, Steven-Johnson syndrome/toxic epidermal necrolysis, viral hemorrhagic fever, Lyme Disease, [place] Spotted  fever and DRESS, syndrome or Infective Endocarditis.  Anticipated course of illness has been reviewed. If not improving as discussed or in the expected timeframe, patient has been advised to seek follow up with their PCP, UC or the ED.  Management of their illness and symptoms has been discussed. Patient was given verbal and written instructions on symptoms that necessitate return to the UC/ED, and instructed to f/u with UC or PCP if not improving.  Anticipated follow up, or next steps in care was reviewed with the patient.  They were provided with written information for reference. Urgent Care Disposition: Routine Follow Up with Specialist  Patient aware they will be contacted regarding results of any pending laboratory results ONLY if the results are abnormal. If abnormal, appropriate treatment measures will be taken based upon results.  Patient education (verbal/handout) given on diagnosis, pathophysiology, treatment of diagnosis, medications & side effects, exercises, the follow-up plan and supportives measures.  We discussed risks and side effects of medications, and also discussed red flags which would warrant immediate follow-up. They should activate EMS, or 911 if red flag symptoms emerge.  Patient and/or parent/guardian (if applicable) agreed with plan and voiced understanding.  No barriers to adherence perceived by myself.  Electronically signed by Sonny Caldron Arledge  Wed 10/03/2024 4:37 PM

## 2024-10-04 LAB — URINE CULTURE: Culture: >=100000 — AB

## 2024-10-09 ENCOUNTER — Encounter: Payer: Self-pay | Admitting: Nurse Practitioner

## 2024-10-09 ENCOUNTER — Inpatient Hospital Stay: Admitting: Nurse Practitioner

## 2024-10-09 ENCOUNTER — Inpatient Hospital Stay: Attending: Nurse Practitioner

## 2024-10-09 VITALS — BP 127/60 | HR 83 | Temp 97.8°F | Resp 18 | Ht 69.0 in | Wt 150.5 lb

## 2024-10-09 DIAGNOSIS — L299 Pruritus, unspecified: Secondary | ICD-10-CM | POA: Diagnosis not present

## 2024-10-09 DIAGNOSIS — C679 Malignant neoplasm of bladder, unspecified: Secondary | ICD-10-CM | POA: Diagnosis not present

## 2024-10-09 DIAGNOSIS — R21 Rash and other nonspecific skin eruption: Secondary | ICD-10-CM | POA: Diagnosis not present

## 2024-10-09 DIAGNOSIS — Z8551 Personal history of malignant neoplasm of bladder: Secondary | ICD-10-CM | POA: Diagnosis present

## 2024-10-09 DIAGNOSIS — N390 Urinary tract infection, site not specified: Secondary | ICD-10-CM

## 2024-10-09 LAB — CBC WITH DIFFERENTIAL (CANCER CENTER ONLY)
Abs Immature Granulocytes: 0.3 K/uL — ABNORMAL HIGH (ref 0.00–0.07)
Basophils Absolute: 0.1 K/uL (ref 0.0–0.1)
Basophils Relative: 1 %
Eosinophils Absolute: 2.1 K/uL — ABNORMAL HIGH (ref 0.0–0.5)
Eosinophils Relative: 14 %
HCT: 41.2 % (ref 39.0–52.0)
Hemoglobin: 13 g/dL (ref 13.0–17.0)
Immature Granulocytes: 2 %
Lymphocytes Relative: 12 %
Lymphs Abs: 1.8 K/uL (ref 0.7–4.0)
MCH: 30.8 pg (ref 26.0–34.0)
MCHC: 31.6 g/dL (ref 30.0–36.0)
MCV: 97.6 fL (ref 80.0–100.0)
Monocytes Absolute: 1 K/uL (ref 0.1–1.0)
Monocytes Relative: 7 %
Neutro Abs: 9.9 K/uL — ABNORMAL HIGH (ref 1.7–7.7)
Neutrophils Relative %: 64 %
Platelet Count: 286 K/uL (ref 150–400)
RBC: 4.22 MIL/uL (ref 4.22–5.81)
RDW: 13.6 % (ref 11.5–15.5)
WBC Count: 15.1 K/uL — ABNORMAL HIGH (ref 4.0–10.5)
nRBC: 0 % (ref 0.0–0.2)

## 2024-10-09 LAB — CMP (CANCER CENTER ONLY)
ALT: <5 U/L (ref 0–44)
AST: 14 U/L — ABNORMAL LOW (ref 15–41)
Albumin: 4.1 g/dL (ref 3.5–5.0)
Alkaline Phosphatase: 67 U/L (ref 38–126)
Anion gap: 10 (ref 5–15)
BUN: 17 mg/dL (ref 8–23)
CO2: 27 mmol/L (ref 22–32)
Calcium: 9.6 mg/dL (ref 8.9–10.3)
Chloride: 104 mmol/L (ref 98–111)
Creatinine: 1.73 mg/dL — ABNORMAL HIGH (ref 0.61–1.24)
GFR, Estimated: 38 mL/min — ABNORMAL LOW
Glucose, Bld: 113 mg/dL — ABNORMAL HIGH (ref 70–99)
Potassium: 4.7 mmol/L (ref 3.5–5.1)
Sodium: 142 mmol/L (ref 135–145)
Total Bilirubin: 0.3 mg/dL (ref 0.0–1.2)
Total Protein: 6.8 g/dL (ref 6.5–8.1)

## 2024-10-09 MED ORDER — PREDNISONE 10 MG PO TABS: ORAL_TABLET | ORAL | 0 refills | Status: AC

## 2024-10-09 NOTE — Progress Notes (Signed)
 " [facility] OFFICE PROGRESS NOTE   Diagnosis: Bladder cancer  INTERVAL HISTORY:   Mr. Kakos returns as scheduled.  Rash and pruritus unchanged.  He began a trial of prednisone  09/26/2025.  He noted no improvement in symptoms.  He was seen at an urgent care clinic 10/03/2024 for evaluation of pruritus.  He was given dexamethasone  10 mg IM, instructed to apply CeraVe anti-itch lotion post shower and continue triamcinolone  twice daily.  He had relief of itching for about 24 hours after the dexamethasone .  No nausea.  No diarrhea.  Objective:  Vital signs in last 24 hours:  Blood pressure 127/60, pulse 83, temperature 97.8 F (36.6 C), temperature source Temporal, resp. rate 18, height 5' 9 (1.753 m), weight 150 lb 8 oz (68.3 kg), SpO2 98%.    HEENT: No mouth ulcers. Resp: Lungs clear bilaterally. Cardio: Regular rate and rhythm. GI: No hepatosplenomegaly. Vascular: No leg edema. Neuro: Alert and oriented. Skin: Erythematous dry appearing skin rash across bilateral posterior axilla and upper back with similar appearing lesions also present on the legs and arms.   Lab Results:  Lab Results  Component Value Date   WBC 15.1 (H) 10/09/2024   HGB 13.0 10/09/2024   HCT 41.2 10/09/2024   MCV 97.6 10/09/2024   PLT 286 10/09/2024   NEUTROABS 9.9 (H) 10/09/2024    Imaging:  No results found.  Medications: I have reviewed the patient's current medications.  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  TURBT 07/18/2018.  Found to have high-grade invasive urothelial carcinoma; with suspicion for invasion into the muscularis propria, T2 lesion with grade 3 histology.  He completed radiation therapy with weekly carboplatin  09/04/2018 through 10/25/2018. Cystoscopy 06/09/2023-nodular lesion left anterior bladder wall, 2 other smaller lesions to the right CTs 07/03/2023-negative for metastatic disease Cystoscopy/TURBT 07/26/2023-scattered patches of  sessile appearing tumors with associated dystrophic calcifications along the left anterior bladder wall and dome; additional small patches of sessile appearing tumor along the posterior bladder wall left greater than right and right anterior bladder wall; entirety of the left anterior sessile tumor was resected, remainder of the tumors were cauterized, pathology-high-grade invasive urothelial carcinoma, muscularis propria not present, lymphovascular invasion not identified, tumor invades at least lamina propria.  PD-L1 CPS 3 Cycle 1 Pembrolizumab  08/29/2023 Cycle 2 Pembrolizumab  09/19/2023 Cycle 3 pembrolizumab  10/11/2023 Cycle 4 pembrolizumab  11/04/2023 Cystoscopy 11/10/2023-bladder with mild trabeculation with lesions.  On the dome of the bladder there is a lesion measuring approximately 4 cm in diameter with some necrotic debris, probably a persistent cancer.  No other lesions noted. Cycle 5 Pembrolizumab  11/25/2023 Cycle 6 pembrolizumab  12/16/2023 CTs 12/30/2023-no evidence of metastatic disease in the chest, abdomen, pelvis Cycle 7 Pembrolizumab  01/06/2024 Cycle 8 Pembrolizumab  01/27/2024 Cycle 9 pembrolizumab  02/17/2024 Cycle 10 Pembrolizumab  03/08/2024 Cystoscopy 03/15/2024 papillary lesion at the bladder dome-less prominent Cycle 11 pembrolizumab  03/29/2024 Cycle 12 pembrolizumab  held 7/24 for skin eruption Cycle 12 Pembrolizumab  resumed 05/14/2024 06/07/2024 treatment held due to new skin lesions 06/21/2024 cystoscopy: Stable 2 cm right bladder dome papillary lesion Recent diagnosis of Parkinson's, followed by Dr. Evonnie Hypothyroid Multiple ulcerated/plaque lesions on exam 04/20/2023.  Biopsy per Dr. Alm confirmed psoriasis.  On clobetasol  twice daily.  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  Progressive rash 08/09/2024-referred to dermatology.  Disposition: [name] appears unchanged.  He continues to have significant pruritus with multiple plaque-like lesions on the trunk and extremities.  The rash/pruritus  may be related to previous immunotherapy.  No improvement with prednisone .  Very brief improvement following a dexamethasone  injection.  He has an appointment scheduled with Dr. Alm next week.  He will taper off of prednisone  over the next few weeks.  He will return for an office visit in 3 weeks.  We are available to see him sooner if needed.  Plan reviewed with Dr. Cloretta.    Olam Ned ANP/GNP-BC   10/09/2024  2:23 PM        "

## 2024-10-11 ENCOUNTER — Ambulatory Visit: Admitting: Dermatology

## 2024-10-17 ENCOUNTER — Encounter: Payer: Self-pay | Admitting: Dermatology

## 2024-10-17 ENCOUNTER — Ambulatory Visit: Admitting: Dermatology

## 2024-10-17 DIAGNOSIS — L409 Psoriasis, unspecified: Secondary | ICD-10-CM

## 2024-10-17 MED ORDER — TACROLIMUS 0.1 % EX OINT: TOPICAL_OINTMENT | Freq: Two times a day (BID) | CUTANEOUS | 4 refills | Status: AC

## 2024-10-17 MED ORDER — TRIAMCINOLONE ACETONIDE 0.1 % EX OINT: 1.0000 | TOPICAL_OINTMENT | Freq: Two times a day (BID) | CUTANEOUS | 4 refills | Status: AC

## 2024-10-17 NOTE — Patient Instructions (Addendum)
 " VISIT SUMMARY:  During your visit, we discussed your ongoing treatment for psoriasis and the resolved intertrigo in your groin area. You have been using your prescribed creams effectively and have seen improvements.  YOUR PLAN:  -PSORIASIS VULGARIS:  Psoriasis vulgaris is a chronic skin condition that causes red, scaly patches. Your condition has shown improvement with the current treatment regimen.   Continue using the rotating creams (Triamcinolone  cream twice a day for 2 weeks and the Tacrolimus  twice a day for 2 weeks) on your legs and back. Additionally, try to get some sun exposure on your legs during the spring.   Ensure you have enough supply of the creams until October. If the creams stop working or you experience a severe flare, please contact the clinic.  -INTERTRIGO, RESOLVED: Intertrigo is a rash in skin folds caused by moisture and friction. Your intertrigo in the groin area has resolved after using fluconazole  pills and ketoconazole  cream. No further treatment is needed at this time.  INSTRUCTIONS:  Please continue your current treatment regimen for psoriasis and ensure you have enough supply of the creams until October. Try to get some sun exposure on your legs during the spring. Contact the clinic if the creams stop working or if you experience a severe flare.      Important Information  Due to recent changes in healthcare laws, you may see results of your pathology and/or laboratory studies on MyChart before the doctors have had a chance to review them. We understand that in some cases there may be results that are confusing or concerning to you. Please understand that not all results are received at the same time and often the doctors may need to interpret multiple results in order to provide you with the best plan of care or course of treatment. Therefore, we ask that you please give us  2 business days to thoroughly review all your results before contacting the office  for clarification. Should we see a critical lab result, you will be contacted sooner.   If You Need Anything After Your Visit  If you have any questions or concerns for your doctor, please call our main line at [phone] If no one answers, please leave a voicemail as directed and we will return your call as soon as possible. Messages left after 4 pm will be answered the following business day.   You may also send us  a message via MyChart. We typically respond to MyChart messages within 1-2 business days.  For prescription refills, please ask your pharmacy to contact our office. Our fax number is [phone].  If you have an urgent issue when the clinic is closed that cannot wait until the next business day, you can page your doctor at the number below.    Please note that while we do our best to be available for urgent issues outside of office hours, we are not available 24/7.   If you have an urgent issue and are unable to reach us , you may choose to seek medical care at your doctor's office, retail clinic, urgent care center, or emergency room.  If you have a medical emergency, please immediately call 911 or go to the emergency department. In the event of inclement weather, please call our main line at [phone] for an update on the status of any delays or closures.  Dermatology Medication Tips: Please keep the boxes that topical medications come in in order to help keep track of the instructions about where and how to use  these. Pharmacies typically print the medication instructions only on the boxes and not directly on the medication tubes.   If your medication is too expensive, please contact our office at [phone] or send us  a message through MyChart.   We are unable to tell what your co-pay for medications will be in advance as this is different depending on your insurance coverage. However, we may be able to find a substitute medication at lower cost or fill out paperwork  to get insurance to cover a needed medication.   If a prior authorization is required to get your medication covered by your insurance company, please allow us  1-2 business days to complete this process.  Drug prices often vary depending on where the prescription is filled and some pharmacies may offer cheaper prices.  The website www.goodrx.com contains coupons for medications through different pharmacies. The prices here do not account for what the cost may be with help from insurance (it may be cheaper with your insurance), but the website can give you the price if you did not use any insurance.  - You can print the associated coupon and take it with your prescription to the pharmacy.  - You may also stop by our office during regular business hours and pick up a GoodRx coupon card.  - If you need your prescription sent electronically to a different pharmacy, notify our office through [facility] or by phone at [phone]     "

## 2024-10-17 NOTE — Progress Notes (Signed)
" ° °  Follow-Up Visit   Subjective  Anthony Pacheco is a 89 y.o. male established patient who presents for FOLLOW UP on the diagnoses listed below:  Patient was last evaluated on 08/09/24.   Psoriasis: Pt stated that he is still alternating TMC & tacrolimus  every 2 weeks. He stated Sx are better but not clear. He stated he is still having issues with itching especially on his back- rating it 8/10 without topicals as he mentioned he is only using an OTC anti-itch cream.    Intertrigo: Pt stated issue has resolved after applying ketoconazole  Bid and taking fluconazole  for 3 days. No concerns today.    The following portions of the chart were reviewed this encounter and updated as appropriate: medications, allergies, medical history  Review of Systems:  No other skin or systemic complaints except as noted in HPI or Assessment and Plan.  Objective  Well appearing patient in no apparent distress; mood and affect are within normal limits.   A focused examination was performed of the following areas: B/L LE   Relevant exam findings are noted in the Assessment and Plan.        Assessment & Plan   Psoriasis vulgaris Mild erythema on legs and back, improved with current treatment regimen. Condition may improve with spring weather and sun exposure.  - Continue rotating triamcinolone  and tacrolimus  creams on legs and back. - Encourage sun exposure on legs during spring. - Ensure adequate supply of creams until October. - Advised to contact clinic if creams stop working or if there is a bad flare.  Intertrigo, resolved Intertrigo in the groin area resolved with fluconazole  pills and ketoconazole  cream.     No follow-ups on file.   Documentation: I have reviewed the above documentation for accuracy and completeness, and I agree with the above.  I, Shirron Maranda, CMA II, am acting as scribe for:  Delon Lenis, DO "

## 2024-10-18 ENCOUNTER — Other Ambulatory Visit: Payer: Self-pay

## 2024-10-29 ENCOUNTER — Telehealth: Payer: Self-pay

## 2024-10-30 ENCOUNTER — Inpatient Hospital Stay: Admitting: Nurse Practitioner

## 2024-10-30 ENCOUNTER — Telehealth: Payer: Self-pay | Admitting: Nurse Practitioner

## 2024-10-30 NOTE — Telephone Encounter (Signed)
 Vm left with patient with request to call back to reschedule appointment cancelled on 2/2 due to inclement weather and clinic closure.

## 2024-10-30 NOTE — Telephone Encounter (Signed)
-----   Message from Nurse Richerd KIDD, RN sent at 10/29/2024 10:55 AM EST ----- Per Olam Ned, NP; okay to reschedule canceled appointment on 10/29/24 to 2 weeks out. Please call patient and reschedule. Thanks Victoria

## 2025-03-26 ENCOUNTER — Ambulatory Visit: Payer: Self-pay | Admitting: Neurology

## 2025-07-22 ENCOUNTER — Ambulatory Visit: Admitting: Dermatology
